# Patient Record
Sex: Female | Born: 1950
Health system: Southern US, Community
[De-identification: ages and names within clinical notes are randomized; demographics above are authoritative.]

## PROBLEM LIST (undated history)

## (undated) DIAGNOSIS — I10 Essential (primary) hypertension: Secondary | ICD-10-CM

## (undated) DIAGNOSIS — Z9889 Other specified postprocedural states: Secondary | ICD-10-CM

## (undated) DIAGNOSIS — M169 Osteoarthritis of hip, unspecified: Secondary | ICD-10-CM

## (undated) DIAGNOSIS — J189 Pneumonia, unspecified organism: Secondary | ICD-10-CM

## (undated) DIAGNOSIS — R112 Nausea with vomiting, unspecified: Secondary | ICD-10-CM

## (undated) DIAGNOSIS — C801 Malignant (primary) neoplasm, unspecified: Secondary | ICD-10-CM

## (undated) DIAGNOSIS — M8580 Other specified disorders of bone density and structure, unspecified site: Secondary | ICD-10-CM

## (undated) DIAGNOSIS — H43392 Other vitreous opacities, left eye: Secondary | ICD-10-CM

## (undated) DIAGNOSIS — F419 Anxiety disorder, unspecified: Secondary | ICD-10-CM

## (undated) DIAGNOSIS — T7840XA Allergy, unspecified, initial encounter: Secondary | ICD-10-CM

## (undated) DIAGNOSIS — F32A Depression, unspecified: Secondary | ICD-10-CM

## (undated) DIAGNOSIS — K219 Gastro-esophageal reflux disease without esophagitis: Secondary | ICD-10-CM

## (undated) DIAGNOSIS — E039 Hypothyroidism, unspecified: Secondary | ICD-10-CM

## (undated) DIAGNOSIS — H269 Unspecified cataract: Secondary | ICD-10-CM

## (undated) DIAGNOSIS — H409 Unspecified glaucoma: Secondary | ICD-10-CM

## (undated) DIAGNOSIS — E785 Hyperlipidemia, unspecified: Secondary | ICD-10-CM

## (undated) HISTORY — DX: Hypothyroidism, unspecified: E03.9

## (undated) HISTORY — DX: Hyperlipidemia, unspecified: E78.5

## (undated) HISTORY — DX: Unspecified cataract: H26.9

## (undated) HISTORY — PX: TONSILLECTOMY AND ADENOIDECTOMY: SHX28

## (undated) HISTORY — PX: JOINT REPLACEMENT: SHX530

## (undated) HISTORY — DX: Unspecified glaucoma: H40.9

## (undated) HISTORY — PX: EYE SURGERY: SHX253

## (undated) HISTORY — DX: Depression, unspecified: F32.A

## (undated) HISTORY — DX: Other vitreous opacities, left eye: H43.392

## (undated) HISTORY — DX: Osteoarthritis of hip, unspecified: M16.9

## (undated) HISTORY — DX: Essential (primary) hypertension: I10

## (undated) HISTORY — DX: Allergy, unspecified, initial encounter: T78.40XA

## (undated) HISTORY — DX: Other specified disorders of bone density and structure, unspecified site: M85.80

---

## 1996-01-18 HISTORY — PX: FLEXIBLE BRONCHOSCOPY: SUR164

## 1998-01-07 ENCOUNTER — Encounter: Payer: Self-pay | Admitting: *Deleted

## 1998-01-07 ENCOUNTER — Ambulatory Visit (HOSPITAL_COMMUNITY): Admission: RE | Admit: 1998-01-07 | Discharge: 1998-01-07 | Payer: Self-pay | Admitting: *Deleted

## 1998-04-08 ENCOUNTER — Other Ambulatory Visit: Admission: RE | Admit: 1998-04-08 | Discharge: 1998-04-08 | Payer: Self-pay | Admitting: *Deleted

## 1999-01-14 ENCOUNTER — Ambulatory Visit (HOSPITAL_COMMUNITY): Admission: RE | Admit: 1999-01-14 | Discharge: 1999-01-14 | Payer: Self-pay | Admitting: *Deleted

## 1999-01-14 ENCOUNTER — Encounter: Payer: Self-pay | Admitting: *Deleted

## 1999-05-21 ENCOUNTER — Other Ambulatory Visit: Admission: RE | Admit: 1999-05-21 | Discharge: 1999-05-21 | Payer: Self-pay | Admitting: *Deleted

## 2000-01-28 ENCOUNTER — Encounter: Payer: Self-pay | Admitting: *Deleted

## 2000-01-28 ENCOUNTER — Ambulatory Visit (HOSPITAL_COMMUNITY): Admission: RE | Admit: 2000-01-28 | Discharge: 2000-01-28 | Payer: Self-pay | Admitting: *Deleted

## 2001-02-14 ENCOUNTER — Encounter: Payer: Self-pay | Admitting: *Deleted

## 2001-02-14 ENCOUNTER — Ambulatory Visit (HOSPITAL_COMMUNITY): Admission: RE | Admit: 2001-02-14 | Discharge: 2001-02-14 | Payer: Self-pay | Admitting: *Deleted

## 2001-02-16 ENCOUNTER — Other Ambulatory Visit: Admission: RE | Admit: 2001-02-16 | Discharge: 2001-02-16 | Payer: Self-pay | Admitting: *Deleted

## 2002-02-25 ENCOUNTER — Ambulatory Visit (HOSPITAL_COMMUNITY): Admission: RE | Admit: 2002-02-25 | Discharge: 2002-02-25 | Payer: Self-pay | Admitting: *Deleted

## 2002-02-25 ENCOUNTER — Encounter: Payer: Self-pay | Admitting: *Deleted

## 2002-10-03 ENCOUNTER — Other Ambulatory Visit: Admission: RE | Admit: 2002-10-03 | Discharge: 2002-10-03 | Payer: Self-pay | Admitting: *Deleted

## 2003-03-07 ENCOUNTER — Ambulatory Visit (HOSPITAL_COMMUNITY): Admission: RE | Admit: 2003-03-07 | Discharge: 2003-03-07 | Payer: Self-pay | Admitting: *Deleted

## 2004-01-05 ENCOUNTER — Other Ambulatory Visit: Admission: RE | Admit: 2004-01-05 | Discharge: 2004-01-05 | Payer: Self-pay | Admitting: *Deleted

## 2004-03-15 ENCOUNTER — Ambulatory Visit (HOSPITAL_COMMUNITY): Admission: RE | Admit: 2004-03-15 | Discharge: 2004-03-15 | Payer: Self-pay | Admitting: *Deleted

## 2004-03-22 ENCOUNTER — Ambulatory Visit: Payer: Self-pay | Admitting: Internal Medicine

## 2004-03-26 ENCOUNTER — Ambulatory Visit: Payer: Self-pay | Admitting: Internal Medicine

## 2004-04-13 ENCOUNTER — Ambulatory Visit: Payer: Self-pay | Admitting: Internal Medicine

## 2004-04-23 ENCOUNTER — Ambulatory Visit: Payer: Self-pay | Admitting: Internal Medicine

## 2004-08-05 ENCOUNTER — Ambulatory Visit: Payer: Self-pay | Admitting: Internal Medicine

## 2004-08-06 ENCOUNTER — Ambulatory Visit: Payer: Self-pay | Admitting: Internal Medicine

## 2004-11-15 ENCOUNTER — Ambulatory Visit: Payer: Self-pay | Admitting: Pulmonary Disease

## 2005-01-13 ENCOUNTER — Other Ambulatory Visit: Admission: RE | Admit: 2005-01-13 | Discharge: 2005-01-13 | Payer: Self-pay | Admitting: *Deleted

## 2005-03-29 ENCOUNTER — Ambulatory Visit (HOSPITAL_COMMUNITY): Admission: RE | Admit: 2005-03-29 | Discharge: 2005-03-29 | Payer: Self-pay | Admitting: Obstetrics and Gynecology

## 2005-09-22 ENCOUNTER — Ambulatory Visit: Payer: Self-pay | Admitting: Internal Medicine

## 2006-03-09 ENCOUNTER — Ambulatory Visit: Payer: Self-pay | Admitting: Internal Medicine

## 2006-03-23 ENCOUNTER — Other Ambulatory Visit: Admission: RE | Admit: 2006-03-23 | Discharge: 2006-03-23 | Payer: Self-pay | Admitting: Obstetrics and Gynecology

## 2006-05-04 ENCOUNTER — Ambulatory Visit (HOSPITAL_COMMUNITY): Admission: RE | Admit: 2006-05-04 | Discharge: 2006-05-04 | Payer: Self-pay | Admitting: Obstetrics and Gynecology

## 2006-09-20 ENCOUNTER — Ambulatory Visit: Payer: Self-pay | Admitting: Internal Medicine

## 2006-10-09 ENCOUNTER — Ambulatory Visit: Payer: Self-pay | Admitting: Internal Medicine

## 2006-10-09 LAB — CONVERTED CEMR LAB
AST: 49 units/L — ABNORMAL HIGH (ref 0–37)
Albumin: 4.3 g/dL (ref 3.5–5.2)
Bacteria, UA: NEGATIVE
Basophils Relative: 0.4 % (ref 0.0–1.0)
CRP, High Sensitivity: 4 (ref 0.00–5.00)
Cholesterol: 216 mg/dL (ref 0–200)
Direct LDL: 147.1 mg/dL
Eosinophils Relative: 2 % (ref 0.0–5.0)
GFR calc Af Amer: 73 mL/min
GFR calc non Af Amer: 61 mL/min
HCT: 40.2 % (ref 36.0–46.0)
Hemoglobin: 13.7 g/dL (ref 12.0–15.0)
MCHC: 34.2 g/dL (ref 30.0–36.0)
Monocytes Relative: 11.2 % — ABNORMAL HIGH (ref 3.0–11.0)
Mucus, UA: NEGATIVE
Nitrite: NEGATIVE
Potassium: 4.5 meq/L (ref 3.5–5.1)
RBC: 4.48 M/uL (ref 3.87–5.11)
RDW: 11.5 % (ref 11.5–14.6)
Specific Gravity, Urine: 1.015 (ref 1.000–1.03)
TSH: 2.39 microintl units/mL (ref 0.35–5.50)
Total Protein, Urine: NEGATIVE mg/dL
Total Protein: 7.3 g/dL (ref 6.0–8.3)
Triglycerides: 125 mg/dL (ref 0–149)
Urine Glucose: NEGATIVE mg/dL

## 2006-10-31 ENCOUNTER — Ambulatory Visit: Payer: Self-pay | Admitting: Internal Medicine

## 2006-12-18 ENCOUNTER — Telehealth (INDEPENDENT_AMBULATORY_CARE_PROVIDER_SITE_OTHER): Payer: Self-pay | Admitting: *Deleted

## 2007-05-08 ENCOUNTER — Ambulatory Visit (HOSPITAL_COMMUNITY): Admission: RE | Admit: 2007-05-08 | Discharge: 2007-05-08 | Payer: Self-pay | Admitting: Obstetrics and Gynecology

## 2007-05-29 ENCOUNTER — Telehealth (INDEPENDENT_AMBULATORY_CARE_PROVIDER_SITE_OTHER): Payer: Self-pay | Admitting: *Deleted

## 2007-06-29 DIAGNOSIS — I10 Essential (primary) hypertension: Secondary | ICD-10-CM | POA: Insufficient documentation

## 2007-06-29 DIAGNOSIS — M8580 Other specified disorders of bone density and structure, unspecified site: Secondary | ICD-10-CM | POA: Insufficient documentation

## 2007-06-29 DIAGNOSIS — E785 Hyperlipidemia, unspecified: Secondary | ICD-10-CM | POA: Insufficient documentation

## 2007-06-29 DIAGNOSIS — E039 Hypothyroidism, unspecified: Secondary | ICD-10-CM | POA: Insufficient documentation

## 2007-07-05 ENCOUNTER — Other Ambulatory Visit: Admission: RE | Admit: 2007-07-05 | Discharge: 2007-07-05 | Payer: Self-pay | Admitting: Obstetrics and Gynecology

## 2007-07-22 ENCOUNTER — Emergency Department (HOSPITAL_COMMUNITY): Admission: EM | Admit: 2007-07-22 | Discharge: 2007-07-22 | Payer: Self-pay | Admitting: Emergency Medicine

## 2007-07-27 ENCOUNTER — Ambulatory Visit: Payer: Self-pay | Admitting: Internal Medicine

## 2007-07-27 DIAGNOSIS — T148XXA Other injury of unspecified body region, initial encounter: Secondary | ICD-10-CM | POA: Insufficient documentation

## 2008-03-14 ENCOUNTER — Ambulatory Visit: Payer: Self-pay | Admitting: Internal Medicine

## 2008-03-14 DIAGNOSIS — R42 Dizziness and giddiness: Secondary | ICD-10-CM | POA: Insufficient documentation

## 2008-05-19 ENCOUNTER — Ambulatory Visit (HOSPITAL_COMMUNITY): Admission: RE | Admit: 2008-05-19 | Discharge: 2008-05-19 | Payer: Self-pay | Admitting: Obstetrics and Gynecology

## 2008-05-23 ENCOUNTER — Encounter: Admission: RE | Admit: 2008-05-23 | Discharge: 2008-05-23 | Payer: Self-pay | Admitting: Obstetrics and Gynecology

## 2008-06-26 ENCOUNTER — Ambulatory Visit: Payer: Self-pay | Admitting: Internal Medicine

## 2008-06-26 ENCOUNTER — Encounter: Payer: Self-pay | Admitting: Internal Medicine

## 2008-06-26 DIAGNOSIS — R05 Cough: Secondary | ICD-10-CM

## 2008-06-26 DIAGNOSIS — M2550 Pain in unspecified joint: Secondary | ICD-10-CM | POA: Insufficient documentation

## 2008-06-26 DIAGNOSIS — R059 Cough, unspecified: Secondary | ICD-10-CM | POA: Insufficient documentation

## 2008-06-27 LAB — CONVERTED CEMR LAB: Vit D, 25-Hydroxy: 32 ng/mL (ref 30–89)

## 2008-06-30 ENCOUNTER — Telehealth (INDEPENDENT_AMBULATORY_CARE_PROVIDER_SITE_OTHER): Payer: Self-pay | Admitting: *Deleted

## 2009-06-04 ENCOUNTER — Encounter: Admission: RE | Admit: 2009-06-04 | Discharge: 2009-06-04 | Payer: Self-pay | Admitting: Obstetrics and Gynecology

## 2009-08-18 ENCOUNTER — Ambulatory Visit: Payer: Self-pay | Admitting: Internal Medicine

## 2009-08-19 LAB — CONVERTED CEMR LAB
ALT: 53 units/L — ABNORMAL HIGH (ref 0–35)
Alkaline Phosphatase: 74 units/L (ref 39–117)
BUN: 18 mg/dL (ref 6–23)
CO2: 30 meq/L (ref 19–32)
CRP, High Sensitivity: 4.72 (ref 0.00–5.00)
Chloride: 108 meq/L (ref 96–112)
Direct LDL: 107.2 mg/dL
Eosinophils Absolute: 0.1 10*3/uL (ref 0.0–0.7)
Eosinophils Relative: 2.5 % (ref 0.0–5.0)
GFR calc non Af Amer: 77.43 mL/min (ref >60)
Glucose, Bld: 105 mg/dL — ABNORMAL HIGH (ref 70–99)
HDL: 45.9 mg/dL (ref >39.00)
Hemoglobin, Urine: NEGATIVE
Hemoglobin: 14.8 g/dL (ref 12.0–15.0)
Ketones, ur: NEGATIVE mg/dL
Leukocytes, UA: NEGATIVE
Lymphocytes Relative: 28 % (ref 12.0–46.0)
Lymphs Abs: 1.7 10*3/uL (ref 0.7–4.0)
Monocytes Absolute: 0.6 10*3/uL (ref 0.1–1.0)
Neutro Abs: 3.5 10*3/uL (ref 1.4–7.7)
Platelets: 227 10*3/uL (ref 150.0–400.0)
RBC: 4.62 M/uL (ref 3.87–5.11)
Sodium: 142 meq/L (ref 135–145)
Specific Gravity, Urine: 1.025 (ref 1.000–1.030)
Total CHOL/HDL Ratio: 5
Total Protein, Urine: NEGATIVE mg/dL
Urobilinogen, UA: 0.2 (ref 0.0–1.0)
Vit D, 25-Hydroxy: 37 ng/mL (ref 30–89)
pH: 5.5 (ref 5.0–8.0)

## 2009-10-15 ENCOUNTER — Telehealth (INDEPENDENT_AMBULATORY_CARE_PROVIDER_SITE_OTHER): Payer: Self-pay | Admitting: *Deleted

## 2009-12-08 ENCOUNTER — Encounter: Payer: Self-pay | Admitting: Internal Medicine

## 2010-02-08 ENCOUNTER — Encounter: Payer: Self-pay | Admitting: Obstetrics and Gynecology

## 2010-02-14 LAB — CONVERTED CEMR LAB
ALT: 56 units/L — ABNORMAL HIGH (ref 0–35)
AST: 44 units/L — ABNORMAL HIGH (ref 0–37)
Albumin: 4.7 g/dL (ref 3.5–5.2)
Alkaline Phosphatase: 75 units/L (ref 39–117)
BUN: 18 mg/dL (ref 6–23)
CRP, High Sensitivity: 4 (ref 0.00–5.00)
Calcium: 9.6 mg/dL (ref 8.4–10.5)
Creatinine, Ser: 1 mg/dL (ref 0.4–1.2)
Eosinophils Absolute: 0.1 10*3/uL (ref 0.0–0.7)
GFR calc non Af Amer: 60.08 mL/min (ref >60)
Glucose, Bld: 106 mg/dL — ABNORMAL HIGH (ref 70–99)
Hemoglobin: 14.7 g/dL (ref 12.0–15.0)
Lymphs Abs: 1.7 10*3/uL (ref 0.7–4.0)
MCHC: 34.8 g/dL (ref 30.0–36.0)
MCV: 90.2 fL (ref 78.0–100.0)
Monocytes Absolute: 0.7 10*3/uL (ref 0.1–1.0)
Platelets: 222 10*3/uL (ref 150.0–400.0)
Potassium: 4 meq/L (ref 3.5–5.1)
RBC: 4.69 M/uL (ref 3.87–5.11)
Total CHOL/HDL Ratio: 4
Total Protein: 7.6 g/dL (ref 6.0–8.3)
Triglycerides: 198 mg/dL — ABNORMAL HIGH (ref 0.0–149.0)
WBC: 5.9 10*3/uL (ref 4.5–10.5)

## 2010-02-18 NOTE — Assessment & Plan Note (Signed)
Summary: Primary svc/ cpx     Primary Provider/Referring Provider:  Sherene Sires  CC:  cpx fasting.  History of Present Illness: 51  yowf quit smoking 2003 with h/o HBP   June 26, 2008 cpx only c/o =  hoarseness and indigestion off and on for up to sev years, worse for several months, better with tums and not eating as much late in evening. no cough or sob over baseline rec trial of diet only to reduce likelihood of gerd  August 18, 2009 cpx c/o occ dizzy, daily drainage issues with cough prod white mucus. Pt denies any significant sore throat, dysphagia, itching, sneezing,  nasal congestion or excess secretions,  fever, chills, sweats, unintended wt loss, pleuritic or exertional cp, hempoptysis, change in activity tolerance  orthopnea pnd or leg swelling. Pt also denies any obvious fluctuation in symptoms with weather or environmental change or other alleviating or aggravating factors.       Current Medications (verified): 1)  Budeprion Sr 150 Mg  Tb12 (Bupropion Hcl) .Marland Kitchen.. 1 Once Daily 2)  Levoxyl 25 Mcg Tabs (Levothyroxine Sodium) .... Take As Directed 3)  Vagifem 25 Mcg  Tabs (Estradiol) .... Twice Weekly 4)  Aspirin Adult Low Strength 81 Mg  Tbec (Aspirin) .Marland Kitchen.. 1 3 Times Per Week 5)  Maxzide-25 37.5-25 Mg  Tabs (Triamterene-Hctz) .... One Tablet By Mouth Daily 6)  Vitamin D (Ergocalciferol) 50000 Unit Caps (Ergocalciferol) .Marland Kitchen.. 1 By Mouth Once Per Wk 7)  Multivitamins  Tabs (Multiple Vitamin) .Marland Kitchen.. 1 Once Daily 8)  Benadryl 25 Mg Tabs (Diphenhydramine Hcl) .Marland Kitchen.. 1 At Bedtime As Needed 9)  Tums 500 Mg Chew (Calcium Carbonate Antacid) .... As Needed 10)  Zantac 150 Mg Tabs (Ranitidine Hcl) .Marland Kitchen.. 1 Once Daily As Needed  Allergies (verified): 1)  ! Pcn  Past History:  Past Medical History: HYPERTENSION (ICD-401.9) HYPOTHYROIDISM (ICD-244.9) DYSLIPIDEMIA (ICD-272.4)    - Taret ldl < 130 pos fm hx, h/o smoking, hbp OSTEOPENIA (ICD-733.90)     - DEXA  09/20/06  T spine-.7,  Left Fem Neck -1.9,   Right Fem neck -1.1 > declined f/u study August 18, 2009  RML Syndorme    - FOB  09/18/1996  DJD R Hip..............................................................................................Marland KitchenMarland KitchenAlusio HEALTH MAINTENANCE.........................................................................Marland KitchenWert     - GYN = Grubb PA     - Colonoscopy neg 04/23/04     -  Td 06/2008     - Pneumovax 7/06 second one     - CPX  August 18, 2009   Allergies:  Pcn > rash  Family History: allergies in mother lymphoma in mother asthma in brother 4 years older only sibling colon ca brother neg cva/aneurysms IHD Father onset 67s  Social History: Reviewed history from 06/26/2008 and no changes required. 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 Nl external ear canals without cough reflex Neck without JVD/Nodes/TM Lungs clear to A and P bilaterally without cough on insp or exp maneuvers RRR no s3 or murmur or increase in P2 Abd soft and  non tender Ext warm without calf tenderness, cyanosis clubbing or edema Skin warm and dry without lesions   Neuro alert, appropriate, no deficits or pathologic reflexes  Cholesterol          [H]  223 mg/dL                   0-454     ATP III Classification            Desirable:  < 200 mg/dL                    Borderline High:  200 - 239  mg/dL               High:  > = 240 mg/dL   Triglycerides        [H]  363.0 mg/dL                 0.9-811.9     Normal:  <150 mg/dL     Borderline High:  147 - 199 mg/dL   HDL                       82.95 mg/dL                 >62.13   VLDL Cholesterol     [H]  72.6 mg/dL                  0.8-65.7  CHO/HDL Ratio:  CHD Risk                             5                    Men          Women     1/2 Average Risk     3.4          3.3     Average Risk          5.0          4.4     2X Average Risk          9.6          7.1     3X Average Risk          15.0          11.0                           Tests: (2) BMP (METABOL)   Sodium                    142 mEq/L                   135-145   Potassium                 4.2 mEq/L  3.5-5.1   Chloride                  108 mEq/L                   96-112   Carbon Dioxide            30 mEq/L                    19-32   Glucose              [H]  105 mg/dL                   04-54   BUN                       18 mg/dL                    0-98   Creatinine                0.8 mg/dL                   1.1-9.1   Calcium                   9.5 mg/dL                   4.7-82.9   GFR                       77.43 mL/min                >60  Tests: (3) CBC Platelet w/Diff (CBCD)   White Cell Count          6.0 K/uL                    4.5-10.5   Red Cell Count            4.62 Mil/uL                 3.87-5.11   Hemoglobin                14.8 g/dL                   56.2-13.0   Hematocrit                41.0 %                      36.0-46.0   MCV                       88.9 fl                     78.0-100.0   MCHC                      36.0 g/dL                   86.5-78.4   RDW                       12.7 %                      11.5-14.6   Platelet Count  227.0 K/uL                  150.0-400.0   Neutrophil %              58.6 %                      43.0-77.0   Lymphocyte %              28.0 %                      12.0-46.0   Monocyte %                 10.6 %                      3.0-12.0   Eosinophils%              2.5 %                       0.0-5.0   Basophils %               0.3 %                       0.0-3.0   Neutrophill Absolute      3.5 K/uL                    1.4-7.7   Lymphocyte Absolute       1.7 K/uL                    0.7-4.0   Monocyte Absolute         0.6 K/uL                    0.1-1.0  Eosinophils, Absolute                             0.1 K/uL                    0.0-0.7   Basophils Absolute        0.0 K/uL                    0.0-0.1  Tests: (4) Hepatic/Liver Function Panel (HEPATIC)   Total Bilirubin           0.9 mg/dL                   2.5-4.2   Direct Bilirubin          0.1 mg/dL                   7.0-6.2   Alkaline Phosphatase      74 U/L                      39-117   AST                  [H]  40 U/L                      0-37   ALT                  [H]  53 U/L  0-35   Total Protein             7.0 g/dL                    1.6-1.0   Albumin                   4.6 g/dL                    9.6-0.4  Tests: (5) TSH (TSH)   FastTSH                   1.62 uIU/mL                 0.35-5.50  Tests: (6) UDip Only (UDIP)   Color                     LT. YELLOW       RANGE:  Yellow;Lt. Yellow   Clarity                   CLEAR                       Clear   Specific Gravity          1.025                       1.000 - 1.030   Urine Ph                  5.5                         5.0-8.0   Protein                   NEGATIVE                    Negative   Urine Glucose             NEGATIVE                    Negative   Ketones                   NEGATIVE                    Negative   Urine Bilirubin           NEGATIVE                    Negative   Blood                     NEGATIVE                    Negative   Urobilinogen              0.2                         0.0 - 1.0   Leukocyte Esterace        NEGATIVE                    Negative   Nitrite                   NEGATIVE  Negative  Tests: (7) Full Range CRP (FCRP)   CRPH                      4.72 mg/L                   0.00-5.00     Note:  An elevated hs-CRP (>5 mg/L) should be repeated after 2 weeks to rule out recent infection or trauma.  Tests: (8) Cholesterol LDL - Direct (DIRLDL)  Cholesterol LDL - Direct                             107.2 mg/dL     Optimal:  <601 mg/dL     Near or Above Optimal:  100-129 mg/dL     Borderline High:  093-235 mg/dL     High:  573-220 mg/dL     Very High:  >254 mg/dL  CXR  Procedure date:  08/18/2009  Findings:      Probable residual linear atelectasis or scarring in the right middle lobe.  No definite pneumonia.  Impression & Recommendations:  Problem # 1:  DYSLIPIDEMIA (ICD-272.4)   - Target ldl < 130 pos fm hx, h/o smoking, hbp  Labs Reviewed: SGOT: 44 (06/26/2008)   SGPT: 56 (06/26/2008)   HDL:57.50 (06/26/2008), 49.3 (10/09/2006)  LDL:DEL (10/09/2006)    LDL 107 August 18, 2009 ok Chol:250 (06/26/2008), 216 (10/09/2006)  Trig:198.0 (06/26/2008), 125 (10/09/2006)  Problem # 2:  OSTEOPENIA (ICD-733.90) vit d ok, delcines dexa  Problem # 3:  COUGH (ICD-786.2) The most common causes of chronic cough in immunocompetent adults include: upper airway cough syndrome (UACS), previously referred to as postnasal drip syndrome,  caused by variety of rhinosinus conditions; (2) asthma; (3) GERD; (4) chronic bronchitis from cigarette smoking or other inhaled environmental irritants; (5) nonasthmatic eosinophilic bronchitis; and (6) bronchiectasis. These conditions, singly or in combination, have accounted for up to 94% of the causes of chronic cough in prospective studies.  try rx for gerd fist  The standardized cough guidelines recently published in Chest are a 14 step process, not a single office visit,  and are intended  to address this problem logically,  with an alogrithm dependent on response to each progressive step  to determine a specific diagnosis with   minimal addtional testing needed. Therefore if compliance is an issue this empiric standardized approach simply won't work.   Problem # 4:  HYPOTHYROIDISM (ICD-244.9)  Her updated medication list for this problem includes:    Levoxyl 25 Mcg Tabs (Levothyroxine sodium) .Marland Kitchen... Take as directed  Labs Reviewed: TSH: 2.41 (06/26/2008)   >  1.62 on rx August 18, 2009  Chol: 250 (06/26/2008)   HDL: 57.50 (06/26/2008)   LDL: DEL (10/09/2006)   TG: 198.0 (06/26/2008)  Medications Added to Medication List This Visit: 1)  Zantac 150 Mg Tabs (Ranitidine hcl) .Marland Kitchen.. 1 once daily as needed  Other Orders: EKG w/ Interpretation (93000) TLB-Lipid Panel (80061-LIPID) TLB-BMP (Basic Metabolic Panel-BMET) (80048-METABOL) TLB-CBC Platelet - w/Differential (85025-CBCD) TLB-Hepatic/Liver Function Pnl (80076-HEPATIC) TLB-TSH (Thyroid Stimulating Hormone) (84443-TSH) TLB-Udip ONLY (81003-UDIP) T-Vitamin D (25-Hydroxy) (27062-37628) TLB-CRP-High Sensitivity (C-Reactive Protein) (86140-FCRP) T-2 View CXR (71020TC) Est. Patient 40-64 years (31517)  Patient Instructions: 1)  Call [phone] for your results w/in next 3 days - if there's something important  I feel you need to know,  I'll be in touch with you directly.  2)  GERD (REFLUX)  is  a common cause of respiratory symptoms. It commonly presents without heartburn and can be treated with medication, but also with lifestyle changes including avoidance of late meals, excessive alcohol, smoking cessation, and avoid fatty foods, chocolate, peppermint, colas, red wine, and acidic juices such as orange juice. NO MINT OR MENTHOL PRODUCTS SO NO COUGH DROPS  3)  USE SUGARLESS CANDY INSTEAD (jolley ranchers)  4)  NO OIL BASED VITAMINS  5)  Prilosec 20 mg Take  one 30-60 min before first meal of the day and pepcid 20 mg (zantac = 150) at bedtime x one month if this does not  erradicate the cough to your satisfaction return to clinic   CardioPerfect ECG  ID:  [id number] Patient: [name], [name] DOB: [birth year] Age: 60 Years Old Sex: Female Race: White Height: 65 Weight: 163 Status: Unconfirmed Past Medical History:  HYPERTENSION (ICD-401.9) HYPOTHYROIDISM (ICD-244.9) DYSLIPIDEMIA (ICD-272.4)    - Taret ldl < 130 pos fm hx, h/o smoking, hbp OSTEOPENIA (ICD-733.90)     - DEXA  09/20/06  T spine-.7,  Left Fem Neck -1.9,  Right Fem neck -1.1 RML Syndorme    - FOB  09/18/1996  DJD R Hip..............................................................................................Marland KitchenMarland KitchenAlusio HEALTH MAINTENANCE.........................................................................Marland KitchenWert     - GYN = Grubb PA     - Colonoscopy neg 04/23/04     -  Td 06/2008     - Pneumovax 7/06 second one     - CPX  June 26, 2008   Allergies:  Pcn > rash Recorded: 08/18/2009 08:58 AM P/PR: 105 ms / 147 ms - Heart rate (maximum exercise) QRS: 91 QT/QTc/QTd: 438 ms / 457 ms / 49 ms - Heart rate (maximum exercise)  P/QRS/T axis: 60 deg / 89 deg / 58 deg - Heart rate (maximum exercise)  Heartrate: 71 bpm  Interpretation:   sinus rhythm  vertical axis  long QT interval, consider hypocalcaemia or quinidine-like drug   Borderline ECG     CXR  Procedure date:  08/18/2009  Findings:      Probable residual linear atelectasis or scarring in the right middle lobe.  No definite pneumonia.

## 2010-02-18 NOTE — Letter (Signed)
Summary: [facility]   Imported By: Sherian Rein 12/16/2009 09:14:38  _____________________________________________________________________  External Attachment:    Type:   Image     Comment:   External Document

## 2010-02-18 NOTE — Progress Notes (Signed)
Summary: wanted vit d results   Phone Note Call from Patient Call back at Work Phone [phone]   Caller: Patient Call For: wert Summary of Call: wants to know Vitamin D levels from last labs.  Initial call taken by: Tivis Ringer, CNA,  October 15, 2009 11:16 AM  Follow-up for Phone Call        [facility].  most recent vit d level done 08/18/2009 and was  37. Normal range is 30-89.  Aundra Millet Reynolds LPN  October 15, 2009 11:42 AM    pt called back.  informed her of vit d level.  nothing further needed.  Aundra Millet Reynolds LPN  October 15, 2009 11:51 AM

## 2010-02-24 ENCOUNTER — Telehealth: Payer: Self-pay | Admitting: Internal Medicine

## 2010-03-04 NOTE — Progress Notes (Signed)
Summary: insurance does not cover maxzide and wants pt to use generic  Phone Note Call from Patient   Caller: Patient Call For: ZOXW Summary of Call: Patient phoned stated that she received notice from her insurance that they will no longer cover her Maxzide and wants her to get a generic. [facility] [place]. She can be reached at [phone] Initial call taken by: Vedia Coffer,  February 24, 2010 11:30 AM  Follow-up for Phone Call        [facility] pt has been receiving Maxide BMN need to verify that she is okay with going generic. Zackery Barefoot CMA  February 24, 2010 11:42 AM  Patient phoned returning a call to triage  pt can be reached at  [phone]. Marland KitchenVedia Coffer  February 24, 2010 1:16 PM  Pt states due to insurance needs to change to generic. Pt requesting 30 day supply. [facility] [place]. Zackery Barefoot CMA  February 24, 2010 2:21 PM        Prescriptions: MAXZIDE-25 37.5-25 MG  TABS (TRIAMTERENE-HCTZ) One tablet by mouth daily  #34 Tablet x 5   Entered by:   Zackery Barefoot CMA   Authorized by:   Nyoka Cowden MD   Signed by:   Zackery Barefoot CMA on 02/24/2010   Method used:   [facility]  [place] [address] (retail)       [address]       [place], [place]  [address]       Ph: [phone] or [phone]       Fax: [phone]   RxID:   [id number]

## 2010-06-01 NOTE — Assessment & Plan Note (Signed)
[facility]                             PULMONARY OFFICE NOTE   NAME:[name], [name]                      MRN:          [id number]  DATE:10/09/2006                            DOB:          [birth year]    HISTORY:  This is a 60 year old white female with chronic cough and  documented right middle lobe/lingular syndrome with previous  bronchoscopy in 1998 indicating gram-negative rods obtained from  culture.  Since that time she has been she has been left with a cough  that waxes and wanes, typically dry, typically more day than nighttime,  associated with sneezing but no purulent secretions, and worse in the  spring and fall.  She comes in now for the last two weeks since the  weather change with increasing nasal congestion with watery nasal  discharge, no fevers, chills, sweats, no purulent sputum, pleuritic  pain, orthopnea, PND or significant dyspnea.   PAST MEDICAL HISTORY:  1. 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  2. Hypothyroidism.  3. Hypertension.   ALLERGIES:  PENICILLIN causes a rash.   MEDICATIONS:  Taken in detail on the worksheet.   SOCIAL HISTORY:  She quit smoking in January of 2003.   FAMILY HISTORY:  Positive for allergies in her mother, also asthma in a  brother.   REVIEW OF SYSTEMS:  Taken in detail on the worksheet and negative except  as outlined above.   PHYSICAL EXAMINATION:  This is an anxious but quite pleasant ambulatory  white female in no acute distress.  She is afebrile with normal vital signs with a weight of 157 pounds  which is no change in baseline.  Height is 5 feet 5 inches, no change.  HEENT:  Remarkable for mild turbinate edema, oropharynx is clear.  Limited funduscopy revealed no obvious arterial change.  NECK:  Supple without cervical adenopathy or tenderness.  Trachea is  midline, no  thyromegaly.  Lung fields were completely clear bilaterally to auscultation and  percussion.  There is a regular rate and rhythm without murmurs, gallops, or rubs  with no increase in P2.  ABDOMEN:  Soft, benign with no palpable organomegaly, masses, or  tenderness.  GENITOURINARY/RECTAL:  Per Dr. Alver Fisher PA.  EXTREMITIES:  Warm without calf tenderness, cyanosis, clubbing, or  edema.  Pedal pulses were present bilaterally, but stronger in the  dorsalis pedis than the posterior tibial distribution.  MUSCULOSKELETAL EXAM:  She had limited internal rotation of the right  hip, otherwise normal hip, ankle, knee, shoulder, elbow, and wrist  motion without crepitus.  SKIN EXAM:  Warm and dry with no lesions.   LABORATORY DATA:  Chest x-ray showed increased markings over the right  middle lobe lingula as in the past.   CBC showed no significant leukocytosis.  Chemistry profile was normal.  Liver function tests showed an SGOT of 49, SGPT of 69, total cholesterol  was 216 with an LDL of 147, HDL of 49, TSH was normal.  Urinalysis was  unremarkable, and CRP was 4.   EKG was normal.   IMPRESSION:  1. Right middle lobe lingula syndrome with chronic cough that appears      to be more upper airway than lower airway in nature, and,      therefore, I believe there are 2 different problems.  I believe she      would be a good candidate to try over-the-counter medications such      as Claritin first, and consideration for nasal steroids and/or      Astelin if not improved.  2. Slight elevation of liver function tests.  In retrospect, she has      had these in the past.  We need to make sure she minimizes alcohol      and acetaminophen use, and screen her for hepatitis serology on her      next visit.  3. Hypothyroidism, on adequate supplements.  4. Hyperlipidemia with positive family history plus smoking history,      therefore, needs to maintain an LDL of less than 130 which she is      not  doing at present.  Because her liver function tests are      slightly elevated, I would try to avoid statins if at all possible,      recommend diet and exercise.  5. Osteopenia by previous studies.  The most recent of which appears      to be in 2004.  If so, she will need a vitamin D level and a repeat      DEXA now to compare to the one in 6/06  6. Arthritis involving the remote right hip.  I have recommended the      use of ibuprofen, as much as 600 mg t.i.d. p.r.n.   Followup will be in a week to review her lab data with her and her range  for bone densitometry as well as a generation of a medication calendar.     [name], MD, [facility]  Electronically Signed    MBW/MedQ  DD: 10/10/2006  DT: 10/11/2006  Job #: [id number]

## 2010-06-01 NOTE — Assessment & Plan Note (Signed)
[name]                             PULMONARY OFFICE NOTE   NAME:[name], [name]                      MRN:          [id number]  DATE:10/31/2006                            DOB:          29-Feb-1950    HISTORY OF PRESENT ILLNESS:  The patient is a 60 year old white female  patient of Dr. Thurston Hole who has a known history of right middle  lobe/lingular scarring secondary to acquired mucociliary dysfunction  that presents for a 3 week followup.  Last visit patient had her  complete physical exam.  Patient has mild dyslipidemia with an LDL goal  of less than 130.  Lab work on October 09, 2006, revealed a total  cholesterol of 216, an LDL at 147.  CRP was at 4.  Patient also had some  slightly elevated LFTs that had been slightly elevated in the past with  an AST of 49, ALT of 69.  Patient's blood sugar was mildly elevated at  122.  Patient reports she does not exercise on a regular basis and  continues to eat and does not diet as much as she should.  Patient does  state that she has a glass of wine on most days.  Patient denies any  chest pain, palpitations, orthopnea, PND, exertional chest pain.   PAST MEDICAL HISTORY:  Reviewed.   CURRENT MEDICATIONS:  Reviewed.   PHYSICAL EXAM:  Patient is a pleasant female in no acute distress.  She  is afebrile with stable vital signs.  HEENT:  Unremarkable.  NECK:  Supple without cervical adenopathy.  LUNG SOUNDS:  Clear to auscultation bilaterally without any wheezing or  crackles.  CARDIAC:  Regular rate.  ABDOMEN:  Soft and nontender.  EXTREMITIES:  Warm without any edema.   DATA:  Bone density on September 20, 2006, showed an AS spine T score of  -0.7, which was slightly improved from previous in 2006 with left hip at  -1.9, which was slightly increased from 1.7 two years ago.   IMPRESSION AND PLAN:  1. Dyslipidemia with an LDL goal of less than 130.  I had an in depth      conversation with patient  concerning today lifestyle modifications      including weight loss, exercise and dietary changes.  Patient      prefers to try lifestyle modifications prior to beginning      medications.  She will return back here in 3 months for a fasting      lipid panel.  2. Mildly elevated LFTs.  Patient has been recommended to decrease her      Tylenol use and alcohol intake.  Patient will return back in 3      months for followup and advice on weight loss.  Patient will return      back in 3 months for followup.  Repeat lab and if still elevated      will need to do further laboratory studies along with possibly an      abdominal ultrasound.  3. Osteopenia.  Patient with slightly decreased T scores.  Patient has  recently had a low Vitamin D level which she took increased Vitamin      D doses over the last 6 months.  Vitamin D level has returned to      normal at 46.  I recommend that she continue on over-the-counter      Caltrate, 1200 mg of calcium with 1000 international units of      Vitamin D.  Patient is advised on exercise, weight bearing      exercises and continued calcium supplements.  Will repeat this in 2      years.  If continues to worsen, will need to add in bisphosphonates      to her present regimen.  4. Complex medication regimen.  Patient's medications were reviewed in      detail.  Patient education was provided.  A computerized medication      calendar was completed for this patient and reviewed in detail.      Rubye Oaks, NP  Electronically Signed      [name], MD, [facility]  Electronically Signed   TP/MedQ  DD: 10/31/2006  DT: 10/31/2006  Job #: [id number]

## 2010-06-03 ENCOUNTER — Other Ambulatory Visit: Payer: Self-pay | Admitting: Nurse Practitioner

## 2010-06-03 DIAGNOSIS — Z1231 Encounter for screening mammogram for malignant neoplasm of breast: Secondary | ICD-10-CM

## 2010-06-04 NOTE — Assessment & Plan Note (Signed)
[facility]                             PULMONARY OFFICE NOTE   NAME:GRAMLINGCyerra, Yim                      MRN:          [id number]  DATE:03/09/2006                            DOB:          1950/01/23    This is a 60 year old white female in for her first follow-up evaluation  from September 2007 when she had comprehensive health care evaluation.  Significant for slight increase in markings over the right middle lobe  and right lower lobe, and reported elevations in blood pressure, for  which she has been maintained on Maxzide 25 mg 1/2 q.a.m.   She denies any exertional chest pain, pleuritic pain, cough, fever,  purulent sputum or history of hemoptysis.   PAST MEDICAL HISTORY:  Right middle lobe/lingular scarring, secondary to  acquired mucocellulare dysfunction, presumably from smoking.  With a  history of gram-negative tracheobronchitis in 1998.   ALLERGIES:  PENICILLIN (causes a rash).   MEDICATIONS:  Taken in detail from the worksheet and correct as listed.   SOCIAL HISTORY:  She quit smoking January 2003.   FAMILY HISTORY:  Positive for allergies in her mother.  Asthma in her  brother.   REVIEW OF SYSTEMS:  Taken in detail and negative, except as above.   PHYSICAL EXAMINATION:  GENERAL:  She is anxious but pleasant, ambulatory  white female in no acute distress.  VITAL SIGNS:  Blood pressure 136/80.  HEENT:  Unremarkable.  NECK:  Supple without cervical adenopathy or tenderness.  Trachea is  midline.  LUNGS:  Lung fields completely clear bilaterally.  No localized or  generalized wheezing or rhonchi.  CARDIAC:  Regular rate and rhythm without murmurs, rubs or gallops.  Increase in P2.  ABDOMEN:  Soft, benign.  EXTREMITIES:  Warm and without calf tenderness, cyanosis, clubbing or  edema.   LABORATORY STUDIES:  Saturation 99% on room air.   IMPRESSION:  Extensive discussion with the patient lasted 15-20 minutes,  with a 25 minute  visit on the following topics.  1. Her blood pressure is adequately controlled.  By self monitoring,      if she is consistently above 85 on the diastolic, then I think it      is reasonable to take the whole Maxzide.  Ideally we would like to      see the systolic less than 135 as well.  2. Right middle lobe/lingular syndrome.  We spent the greatest amount      of time discussing this issue.  She is concerned about lung cancer;      but, clinically has had several significant infections in the      distribution of the right middle lobe and lingula, leading to      enough scarring to where it would be difficult to distinguish the      signal to noise ratio without more aggressive screening (such as      CT scanning and bronchoscopy).  In the absence of convincing change      on a chest x-ray and/or new symptoms, I do not believe it is cost  effective to do CT scans -- based on the most recent guidelines      from the [employer]; and it would expose the patient      to unnecessary radiation and potential inappropriate surgery.      Followup will be in September 2008, sooner with a chest x-ray      pending from today's evaluation.     [name], MD, [facility]  Electronically Signed    MBW/MedQ  DD: 03/09/2006  DT: 03/09/2006  Job #: [id number]

## 2010-06-04 NOTE — Assessment & Plan Note (Signed)
[facility]                               PULMONARY OFFICE NOTE   NAME:[name], [name]                      MRN:          [id number]  DATE:09/22/2005                            DOB:          1950-12-07    CHIEF COMPLAINT:  Cough.   HISTORY:  This is a 60 year old, former smoker with diagnosis of right  middle lobe/lingular scarring with previous evidence of gram-negative-rod  tracheobronchitis in 1998, but much better since she stopped smoking in  January of 2003, with only minimal mucoid production in the mornings now, no  significant dyspnea or pleuritic pain, fevers, chills, sweats, orthopnea,  PND or leg swelling.  She admits she has not been aerobically active, denies  any exertional chest pain, orthopnea, PND, TIA or claudication symptoms.   PAST MEDICAL HISTORY:  1. Right middle lobe lingular scarring, presumed secondary to acquire      mucociliary dysfunction secondary to smoking that has improved since      smoking cessation.  2. Chronic rhinitis, controlled on over-the-counter medications.  3. Chronic fatigue responsive to Wellbutrin.  4. Hypothyroidism, on supplements.  5. Status post remote tonsillectomy.  6. History of borderline diabetes and hypertension, never treated to date.   ALLERGIES:  PENICILLIN causes a rash.   MEDICATIONS:  Wellbutrin, Levoxyl, Vagifem (hormone replacement therapy), a  baby aspirin and multivitamins daily.   SOCIAL HISTORY:  She quit smoking in January 2003.  She denies any alcohol  use.  She works as a Clinical research associate.  Her father was an anesthesiologist.   FAMILY HISTORY:  Positive for allergies in her mother and asthma in her  brother; brother had diabetes and also developed colon cancer.  This past  year, her mother developed lymphoma in her 64s and her father had heart and  stroke problems in his 87s.   REVIEW OF SYSTEMS:  Taken in detail on the work sheet and significant for  the problems as outlined  above.   PHYSICAL EXAMINATION:  GENERAL:  This is a pleasant, ambulatory, slightly  anxious white female in no acute distress.  VITAL SIGNS:  The patient has normal vital signs with a height of 5 feet 4  inches, which is no change in baseline.  Weight is 162, which was also no  change.  Blood pressure is 130/80.  HEENT:  Ocular exam was done with a limited funduscopy revealing no obvious  retinal or arterial change.  Oropharynx was clear.  NECK:  No evidence of cervical adenopathy or tenderness.  Trachea is  midline.  No thyromegaly.  Carotid upstrokes were brisk without any bruits.  Oropharynx was clear.  Dentition was intact.  Nasal turbinates normal.  LUNGS:  Lung fields are perfectly clear bilaterally to auscultation and  percussion with no localized or generalized wheezing or rhonchi.  CARDIAC:  Regular rhythm without murmur, gallop or rub present.  There is no  displacement of PMI or increase in P2.  BREASTS AND GENITOURINARY:  Per Dr. Jeannette Corpus office.  ABDOMEN:  Soft and benign with no palpable organomegaly, masses or  tenderness.  No bruits appreciated.  EXTREMITIES:  Warm without calf tenderness, cyanosis or edema.  SKIN:  Warm and dry with no lesions.  MUSCULOSKELETAL:  No obvious joint abnormalities.  EXTREMITIES:  Pedal pulses were stronger in dorsalis pedis than the  posterior tibial distribution in symmetric fashion bilaterally.  NEUROLOGIC:  No focal deficits or pathologic reflexes.   Chest x-ray showed minimal scarring over the lingula and right middle lobe.   EKG revealed sinus rhythm with no ischemic changes.   CBC was normal with no significant eosinophilia.  TSH was normal.  CRP was  6.0.  Urinalysis showed traces of rbc's.  Total cholesterol was 227 with an  HDL of 51, LDL of 130 and fasting blood sugar is now 87.   IMPRESSION:  1. Acquired mucociliary dysfunction with right middle lobe and lingular      scarring clinically has resolved with minimal  residual cough, status      post successful smoking cessation in 2003.  2. Hypothyroidism, well-compensated on present dose of thyroid, which was      not changed.  3. Chronic fatigue, much better on Wellbutrin, which also affords some      protection against recurrent smoking.  4. History of borderline diabetes, not present now.  5. Borderline hypertension.  She does tend to have a tendency of slight      fluid retention in her legs at the end of the day, therefore I      recommended Maxzide 25 mg one-half daily, which will also afford some      protection against calcium loss in her post-menopausal state.  6. Hyperlipidemia.  Her LDL has actually improved from 160 a year ago,      although her CRP is slightly elevated.  I have emphasized the      importance of maintaining as much aerobic weightbearing exercise as      possible for purposes of optimizing HDL and reducing CRP and also      improving bone health in general.  7. Positive family history of colon cancer.  However, a chart review      indicates she underwent a normal colonoscopy in 2006 and is not due for      recall for 10 years.  8. Health maintenance:  All of her gynecological health care is through      Dr. Felipe Drone office.  Her last tetanus was in 2000 and Pneumovax in      2006; followup can be yearly therefore.                                   [name], MD, [facility]   MBW/MedQ  DD:  09/26/2005  DT:  09/27/2005  Job #:  [id number]

## 2010-06-11 ENCOUNTER — Ambulatory Visit (HOSPITAL_COMMUNITY): Payer: Federal, State, Local not specified - PPO

## 2010-07-07 ENCOUNTER — Ambulatory Visit (HOSPITAL_COMMUNITY): Payer: Federal, State, Local not specified - PPO

## 2010-07-09 ENCOUNTER — Ambulatory Visit (HOSPITAL_COMMUNITY)
Admission: RE | Admit: 2010-07-09 | Discharge: 2010-07-09 | Disposition: A | Discharge status: Home / Self Care | Payer: Federal, State, Local not specified - PPO | Source: Ambulatory Visit | Attending: Nurse Practitioner | Admitting: Nurse Practitioner

## 2010-07-09 DIAGNOSIS — Z1231 Encounter for screening mammogram for malignant neoplasm of breast: Secondary | ICD-10-CM

## 2010-09-02 ENCOUNTER — Other Ambulatory Visit: Payer: Self-pay | Admitting: Internal Medicine

## 2010-10-07 ENCOUNTER — Other Ambulatory Visit: Payer: Self-pay | Admitting: Internal Medicine

## 2010-10-28 ENCOUNTER — Encounter: Payer: Federal, State, Local not specified - PPO | Admitting: Internal Medicine

## 2010-11-05 ENCOUNTER — Encounter: Payer: Self-pay | Admitting: Internal Medicine

## 2010-11-08 ENCOUNTER — Other Ambulatory Visit: Payer: Self-pay | Admitting: Internal Medicine

## 2010-11-08 ENCOUNTER — Ambulatory Visit (INDEPENDENT_AMBULATORY_CARE_PROVIDER_SITE_OTHER): Payer: Federal, State, Local not specified - PPO | Admitting: Internal Medicine

## 2010-11-08 ENCOUNTER — Other Ambulatory Visit (INDEPENDENT_AMBULATORY_CARE_PROVIDER_SITE_OTHER): Payer: Federal, State, Local not specified - PPO

## 2010-11-08 ENCOUNTER — Ambulatory Visit (INDEPENDENT_AMBULATORY_CARE_PROVIDER_SITE_OTHER)
Admission: RE | Admit: 2010-11-08 | Discharge: 2010-11-08 | Disposition: A | Discharge status: Home / Self Care | Payer: Federal, State, Local not specified - PPO | Source: Ambulatory Visit | Attending: Internal Medicine | Admitting: Internal Medicine

## 2010-11-08 ENCOUNTER — Encounter: Payer: Self-pay | Admitting: Internal Medicine

## 2010-11-08 ENCOUNTER — Telehealth: Payer: Self-pay | Admitting: Internal Medicine

## 2010-11-08 DIAGNOSIS — E039 Hypothyroidism, unspecified: Secondary | ICD-10-CM

## 2010-11-08 DIAGNOSIS — M899 Disorder of bone, unspecified: Secondary | ICD-10-CM

## 2010-11-08 DIAGNOSIS — I10 Essential (primary) hypertension: Secondary | ICD-10-CM

## 2010-11-08 DIAGNOSIS — M2550 Pain in unspecified joint: Secondary | ICD-10-CM

## 2010-11-08 DIAGNOSIS — Z8371 Family history of colonic polyps: Secondary | ICD-10-CM | POA: Insufficient documentation

## 2010-11-08 DIAGNOSIS — M949 Disorder of cartilage, unspecified: Secondary | ICD-10-CM

## 2010-11-08 DIAGNOSIS — Z83719 Family history of colon polyps, unspecified: Secondary | ICD-10-CM

## 2010-11-08 DIAGNOSIS — Z0000 Encounter for general adult medical examination without abnormal findings: Secondary | ICD-10-CM

## 2010-11-08 DIAGNOSIS — E785 Hyperlipidemia, unspecified: Secondary | ICD-10-CM

## 2010-11-08 LAB — CBC WITH DIFFERENTIAL/PLATELET
Basophils Absolute: 0 10*3/uL (ref 0.0–0.1)
Hemoglobin: 14.6 g/dL (ref 12.0–15.0)
Lymphocytes Relative: 31 % (ref 12.0–46.0)
Monocytes Relative: 12.6 % — ABNORMAL HIGH (ref 3.0–12.0)
Neutro Abs: 2.7 10*3/uL (ref 1.4–7.7)
Neutrophils Relative %: 54.2 % (ref 43.0–77.0)
Platelets: 214 10*3/uL (ref 150.0–400.0)
RDW: 12.9 % (ref 11.5–14.6)

## 2010-11-08 LAB — LDL CHOLESTEROL, DIRECT: Direct LDL: 179.7 mg/dL

## 2010-11-08 LAB — BASIC METABOLIC PANEL
CO2: 28 mEq/L (ref 19–32)
Chloride: 103 mEq/L (ref 96–112)
Potassium: 4.4 mEq/L (ref 3.5–5.1)
Sodium: 139 mEq/L (ref 135–145)

## 2010-11-08 LAB — HEPATIC FUNCTION PANEL
ALT: 72 U/L — ABNORMAL HIGH (ref 0–35)
Albumin: 4.6 g/dL (ref 3.5–5.2)
Alkaline Phosphatase: 66 U/L (ref 39–117)
Total Protein: 7.5 g/dL (ref 6.0–8.3)

## 2010-11-08 LAB — LIPID PANEL
Cholesterol: 284 mg/dL — ABNORMAL HIGH (ref 0–200)
VLDL: 28.6 mg/dL (ref 0.0–40.0)

## 2010-11-08 LAB — URINALYSIS
Ketones, ur: NEGATIVE
Leukocytes, UA: NEGATIVE
Specific Gravity, Urine: 1.01 (ref 1.000–1.030)
Urine Glucose: NEGATIVE
pH: 6 (ref 5.0–8.0)

## 2010-11-08 LAB — SEDIMENTATION RATE: Sed Rate: 17 mm/hr (ref 0–22)

## 2010-11-08 NOTE — Progress Notes (Signed)
Subjective:    Patient ID: [name], female    DOB: 1950/04/05, 60 y.o.   MRN: [id number]  HPI   20 yowf quit smoking 2003 with h/o HBP   June 26, 2008 cpx only c/o = hoarseness and indigestion off and on for up to sev years, worse for several months, better with tums and not eating as much late in evening. no cough or sob over baseline rec trial of diet only to reduce likelihood of gerd   August 18, 2009 cpx c/o occ dizzy, daily drainage issues with cough prod white mucus.     11/08/2010 f/u ov/[name] CPX  No new complaints  Pt denies any significant sore throat, dysphagia, itching, sneezing,has  minimal nasal congestion but no excess secretions, fever, chills, sweats, unintended wt loss, pleuritic or exertional cp, hempoptysis, change in activity tolerance orthopnea pnd or leg swelling. Pt also denies any obvious fluctuation in symptoms with weather or environmental change or other alleviating or aggravating factors.     Allergies  1) ! Pcn     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/1996  DJD R Hip..............................................................................................Marland KitchenMarland KitchenAlusio  HEALTH MAINTENANCE.........................................................................Marland KitchenWert  - GYN = [facility]  - Colonoscopy neg 04/23/2004  - Td 06/2008  - Pneumovax 07/2004  second one  - CPX 11/08/2010  Allergies: Pcn > rash   Family History:  allergies in mother  lymphoma in mother  asthma in brother 4 years older only sibling  colon ca brother  Prostate ca same brother neg cva/aneurysms  IHD Father onset 61s   Social History:  Quit smokng Jan 2003  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  HENT: Positive for congestion. Negative for hearing loss, ear pain, nosebleeds, sore throat, facial swelling, rhinorrhea, sneezing, mouth sores, trouble swallowing, neck pain, neck stiffness, dental problem, voice change, postnasal drip, sinus pressure, tinnitus and ear discharge.   Eyes: Negative for photophobia, discharge, itching and visual disturbance.  Respiratory: Positive for cough. Negative for apnea, choking, chest tightness, shortness of breath, wheezing and stridor.   Cardiovascular: Negative for chest pain, palpitations and leg swelling.  Gastrointestinal: Negative for nausea, vomiting, abdominal pain, constipation, blood in stool and abdominal distention.  Genitourinary: Negative for dysuria, urgency, frequency, hematuria, flank pain, decreased urine volume and difficulty urinating.  Musculoskeletal: Positive for arthralgias. Negative for myalgias, back pain, joint swelling and gait problem.  Skin: Negative for color change, pallor and rash.  Neurological: Negative for dizziness, tremors, seizures, syncope, speech difficulty, weakness, light-headedness, numbness and headaches.  Hematological: Negative for adenopathy. Does not bruise/bleed easily.  Psychiatric/Behavioral: Negative for confusion, sleep disturbance and agitation. The patient is not nervous/anxious.        Objective:   Physical Exam   wt 158 June 26, 2008 > 163 August 18, 2009 > 162 11/08/2010  in general this is a very pleasant healthy alert ambulatory white female in no acute distress with minimal voice fatigue  Afeb with normal vital signs  HEENT: nl dentition, turbinates, and orophanx. Nl external ear canals without cough reflex  Neck without JVD/Nodes/TM  Lungs clear  to A and P bilaterally without cough on insp or exp maneuvers  RRR no s3  or murmur or increase in P2  Abd soft and non tender  Ext warm without calf tenderness, cyanosis clubbing or edema  Skin warm and dry without lesions  Neuro alert, appropriate, no deficits or pathologic reflexes    CXR  11/08/2010 :  Comparison: 08/18/2009  Findings: Stable coarse perihilar interstitial prominence or scarring. There is some mild central peribronchial thickening. No confluent airspace infiltrate or overt interstitial edema. No effusion. Heart size normal. Regional bones unremarkable.  IMPRESSION:  1. No acute disease. Persistent coarse perihilar interstitial prominence.   Assessment & Plan:

## 2010-11-08 NOTE — Telephone Encounter (Signed)
Spoke with pt and notified of results per Dr. Wert. Pt verbalized understanding and denied any questions. 

## 2010-11-08 NOTE — Patient Instructions (Signed)
Please remember to go to the lab and x-ray department downstairs for your tests - we will call you with the results when then are available.  Ok to try reducing the wellbutrin to every other day for a month and then stop   CPX due yearly

## 2010-11-09 NOTE — Assessment & Plan Note (Signed)
Adequate control on present rx, reviewed  

## 2010-11-09 NOTE — Assessment & Plan Note (Addendum)
Not Adequate control on present rx, reviewed  - needs diet and ex and f/u in 3 months to consider medical rx

## 2010-11-09 NOTE — Assessment & Plan Note (Signed)
Studies reviewed, rec repeat dexa "my insurance won't pay for it"

## 2010-11-09 NOTE — Assessment & Plan Note (Signed)
Discussed with Dr Leone Payor, no indication to change recall schedule

## 2010-11-10 ENCOUNTER — Other Ambulatory Visit: Payer: Self-pay | Admitting: Internal Medicine

## 2010-11-11 LAB — VITAMIN D 1,25 DIHYDROXY
Vitamin D 1, 25 (OH)2 Total: 69 pg/mL (ref 18–72)
Vitamin D2 1, 25 (OH)2: 8 pg/mL
Vitamin D3 1, 25 (OH)2: 61 pg/mL

## 2010-12-08 ENCOUNTER — Other Ambulatory Visit: Payer: Self-pay | Admitting: Internal Medicine

## 2011-02-25 ENCOUNTER — Telehealth: Payer: Self-pay | Admitting: Internal Medicine

## 2011-02-25 DIAGNOSIS — E785 Hyperlipidemia, unspecified: Secondary | ICD-10-CM

## 2011-02-25 NOTE — Telephone Encounter (Signed)
I spoke with pt and she states she thought she was suppose to come and have her cholesterol rechecked. I looked in her chart and did not see where this was so. Pt was seen 11/08/11 and was advised to have yearly check ups. Pt wanted [place] to check with MW to make sure she does not need to come in for this. Please advise Dr. Sherene Sires, thanks

## 2011-02-25 NOTE — Telephone Encounter (Signed)
Yes, see under A/P for hyperlipidemia  Needs fasting lipid profile then ov next day dx hyperlipidemia

## 2011-02-25 NOTE — Telephone Encounter (Signed)
lmomtcb x1 for pt 

## 2011-02-28 NOTE — Telephone Encounter (Signed)
I spoke with pt is aware of MW recs. Pt is scheduled to come in and see MW on 03/16/11. Pt aware to have fasting lipid done the day before

## 2011-03-15 ENCOUNTER — Other Ambulatory Visit (INDEPENDENT_AMBULATORY_CARE_PROVIDER_SITE_OTHER): Payer: Federal, State, Local not specified - PPO

## 2011-03-15 DIAGNOSIS — E785 Hyperlipidemia, unspecified: Secondary | ICD-10-CM

## 2011-03-15 LAB — LIPID PANEL
Cholesterol: 256 mg/dL — ABNORMAL HIGH (ref 0–200)
Triglycerides: 256 mg/dL — ABNORMAL HIGH (ref 0.0–149.0)

## 2011-03-16 ENCOUNTER — Encounter: Payer: Self-pay | Admitting: Internal Medicine

## 2011-03-16 ENCOUNTER — Ambulatory Visit (INDEPENDENT_AMBULATORY_CARE_PROVIDER_SITE_OTHER): Payer: Federal, State, Local not specified - PPO | Admitting: Internal Medicine

## 2011-03-16 DIAGNOSIS — M899 Disorder of bone, unspecified: Secondary | ICD-10-CM

## 2011-03-16 DIAGNOSIS — E785 Hyperlipidemia, unspecified: Secondary | ICD-10-CM

## 2011-03-16 DIAGNOSIS — I10 Essential (primary) hypertension: Secondary | ICD-10-CM

## 2011-03-16 DIAGNOSIS — E039 Hypothyroidism, unspecified: Secondary | ICD-10-CM

## 2011-03-16 NOTE — Progress Notes (Signed)
Subjective:    Patient ID: [name], female    DOB: [birth year]    MRN: [id number]  HPI   49 yowf quit smoking 2003 with h/o HBP   June 26, 2008 cpx only c/o = hoarseness and indigestion off and on for up to sev years, worse for several months, better with tums and not eating as much late in evening. no cough or sob over baseline rec trial of diet only to reduce likelihood of gerd   August 18, 2009 cpx c/o occ dizzy, daily drainage issues with cough prod white mucus.     11/08/2010 f/u ov/[name] CPX  No new complaints rec Ok to try reducing the wellbutrin to every other day for a month and then stop     03/16/2011 f/u ov/[name] still on qod wellbutrin here for f/u lipid rx diet/ ex / fish oil no sob, cp, tia or claudication/ cough or sob. cannont tell any change on lower dose wellbutrin  Sleeping ok without nocturnal  or early am exacerbation  of respiratory  c/o's. Also denies any obvious fluctuation of symptoms with weather or environmental changes or other aggravating or alleviating factors except as outlined above   Pt denies any significant sore throat, dysphagia, itching, sneezing,has  minimal nasal congestion but no excess secretions, fever, chills, sweats, unintended wt loss, pleuritic or exertional cp, hempoptysis, change in activity tolerance orthopnea pnd or leg swelling. Pt also denies any obvious fluctuation in symptoms with weather or environmental change or other alleviating or aggravating factors.     Allergies  1) ! Pcn     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/1996  DJD R Hip..............................................................................................Marland KitchenMarland KitchenAlusio  HEALTH  MAINTENANCE.........................................................................Marland KitchenWert  - GYN = [facility]  - Colonoscopy neg 04/23/2004  - Td 06/2008  - Pneumovax 07/2004  second one  - CPX 11/08/2010     Family History:  allergies in mother  lymphoma in mother  asthma in brother 4 years older only sibling  colon ca brother  Prostate ca same brother neg cva/aneurysms  IHD Father onset 3s   Social History:  Quit smokng Jan 2003  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 Nl external ear canals without cough reflex  Neck without JVD/Nodes/TM  Lungs clear to A and P bilaterally without cough on insp or exp maneuvers  RRR no s3 or murmur or increase in P2  Abd soft and non tender  Ext warm without calf tenderness, cyanosis clubbing or edema  Skin warm and dry without lesions  Ns    CXR  11/08/2010 :  Comparison: 08/18/2009  Findings: Stable coarse perihilar interstitial prominence or scarring. There is some mild central peribronchial thickening. No confluent airspace infiltrate or overt interstitial edema. No effusion. Heart size normal. Regional bones unremarkable.  IMPRESSION:  1. No acute disease. Persistent coarse  perihilar interstitial prominence.   Assessment & Plan:

## 2011-03-16 NOTE — Patient Instructions (Signed)
Weight control is simply a matter of calorie balance which needs to be tilted in your favor by eating less and exercising more.  To get the most out of exercise, you need to be continuously aware that you are short of breath, but never out of breath, for 30 minutes daily. As you improve, it will actually be easier for you to do the same amount of exercise  in  30 minutes so always push to the level where you are short of breath.  If this does not result in gradual weight reduction then I strongly recommend you see a nutritionist with a food diary x 2 weeks so that we can work out a negative calorie balance which is universally effective in steady weight loss programs.  Think of your calorie balance like you do your bank account where in this case you want the balance to go down so you must take in less calories than you burn up.  It's just that simple:  Hard to do, but easy to understand.  Good luck!   Baby aspirin daily.  Ok to wellbutrin  Ok to repeat your cholesterol profile in 3 months  Return here in October after the 22nd for physical

## 2011-03-17 NOTE — Assessment & Plan Note (Signed)
Lab Results  Component Value Date   CHOL 256* 03/15/2011   HDL 57.70 03/15/2011   LDLDIRECT 164.0 03/15/2011   TRIG 256.0* 03/15/2011   CHOLHDL 4 03/15/2011    Improved but not at goal  Discussed in detail all the  indications, usual  risks and alternatives  relative to the benefits with patient who wants to continue with diet and wt loss for now given the info on the NNT for lipid drugs

## 2011-03-17 NOTE — Assessment & Plan Note (Signed)
Wishes to defer next dexa to age 61.

## 2011-03-17 NOTE — Assessment & Plan Note (Signed)
Lab Results  Component Value Date   TSH 2.85 11/08/2010   Adequate control on present rx, reviewed

## 2011-03-17 NOTE — Assessment & Plan Note (Signed)
Adequate control on present rx, reviewed  

## 2011-05-13 ENCOUNTER — Other Ambulatory Visit: Payer: Self-pay | Admitting: Internal Medicine

## 2011-07-15 ENCOUNTER — Telehealth: Payer: Self-pay | Admitting: Internal Medicine

## 2011-07-15 NOTE — Telephone Encounter (Signed)
Spoke with pharmacist and advised to switch to levothyroxine same strength. She verbalized understanding and states nothing further needed.

## 2011-07-15 NOTE — Telephone Encounter (Signed)
[facility]  Brand Levoxyl is on long term back order  Can they change this to synthroid or levothyroxine ?  Dr Sherene Sires Please advise . Thank you

## 2011-07-15 NOTE — Telephone Encounter (Signed)
Ok strength is the same

## 2011-08-02 ENCOUNTER — Other Ambulatory Visit: Payer: Self-pay | Admitting: Obstetrics and Gynecology

## 2011-08-02 ENCOUNTER — Other Ambulatory Visit: Payer: Self-pay | Admitting: Nurse Practitioner

## 2011-08-02 DIAGNOSIS — Z1231 Encounter for screening mammogram for malignant neoplasm of breast: Secondary | ICD-10-CM

## 2011-08-19 ENCOUNTER — Ambulatory Visit (HOSPITAL_COMMUNITY)
Admission: RE | Admit: 2011-08-19 | Discharge: 2011-08-19 | Disposition: A | Discharge status: Home / Self Care | Payer: Federal, State, Local not specified - PPO | Source: Ambulatory Visit | Attending: Obstetrics and Gynecology | Admitting: Obstetrics and Gynecology

## 2011-08-19 DIAGNOSIS — Z1231 Encounter for screening mammogram for malignant neoplasm of breast: Secondary | ICD-10-CM | POA: Insufficient documentation

## 2011-11-14 ENCOUNTER — Other Ambulatory Visit: Payer: Self-pay | Admitting: Internal Medicine

## 2011-11-30 ENCOUNTER — Ambulatory Visit (INDEPENDENT_AMBULATORY_CARE_PROVIDER_SITE_OTHER)
Admission: RE | Admit: 2011-11-30 | Discharge: 2011-11-30 | Disposition: A | Discharge status: Home / Self Care | Payer: Federal, State, Local not specified - PPO | Source: Ambulatory Visit | Attending: Internal Medicine | Admitting: Internal Medicine

## 2011-11-30 ENCOUNTER — Ambulatory Visit (INDEPENDENT_AMBULATORY_CARE_PROVIDER_SITE_OTHER): Payer: Federal, State, Local not specified - PPO | Admitting: Internal Medicine

## 2011-11-30 ENCOUNTER — Other Ambulatory Visit (INDEPENDENT_AMBULATORY_CARE_PROVIDER_SITE_OTHER): Payer: Federal, State, Local not specified - PPO

## 2011-11-30 ENCOUNTER — Encounter: Payer: Self-pay | Admitting: Internal Medicine

## 2011-11-30 VITALS — BP 132/80 | HR 79 | Temp 97.5°F | Ht 65.0 in | Wt 159.0 lb

## 2011-11-30 DIAGNOSIS — E785 Hyperlipidemia, unspecified: Secondary | ICD-10-CM

## 2011-11-30 DIAGNOSIS — Z8371 Family history of colonic polyps: Secondary | ICD-10-CM

## 2011-11-30 DIAGNOSIS — M949 Disorder of cartilage, unspecified: Secondary | ICD-10-CM

## 2011-11-30 DIAGNOSIS — I10 Essential (primary) hypertension: Secondary | ICD-10-CM

## 2011-11-30 DIAGNOSIS — E039 Hypothyroidism, unspecified: Secondary | ICD-10-CM

## 2011-11-30 DIAGNOSIS — M899 Disorder of bone, unspecified: Secondary | ICD-10-CM

## 2011-11-30 DIAGNOSIS — M2550 Pain in unspecified joint: Secondary | ICD-10-CM

## 2011-11-30 DIAGNOSIS — Z83719 Family history of colon polyps, unspecified: Secondary | ICD-10-CM

## 2011-11-30 LAB — URINALYSIS
Bilirubin Urine: NEGATIVE
Ketones, ur: NEGATIVE
Specific Gravity, Urine: 1.01 (ref 1.000–1.030)
Total Protein, Urine: NEGATIVE
Urine Glucose: NEGATIVE
pH: 6 (ref 5.0–8.0)

## 2011-11-30 LAB — BASIC METABOLIC PANEL
BUN: 18 mg/dL (ref 6–23)
CO2: 27 mEq/L (ref 19–32)
Chloride: 99 mEq/L (ref 96–112)
Creatinine, Ser: 0.9 mg/dL (ref 0.4–1.2)
Potassium: 4.3 mEq/L (ref 3.5–5.1)

## 2011-11-30 LAB — HEPATIC FUNCTION PANEL
ALT: 71 U/L — ABNORMAL HIGH (ref 0–35)
Bilirubin, Direct: 0.1 mg/dL (ref 0.0–0.3)
Total Protein: 7.8 g/dL (ref 6.0–8.3)

## 2011-11-30 LAB — CBC WITH DIFFERENTIAL/PLATELET
Basophils Relative: 0.4 % (ref 0.0–3.0)
Eosinophils Absolute: 0.1 10*3/uL (ref 0.0–0.7)
Eosinophils Relative: 2.2 % (ref 0.0–5.0)
HCT: 45.4 % (ref 36.0–46.0)
Lymphs Abs: 1.6 10*3/uL (ref 0.7–4.0)
MCHC: 33.5 g/dL (ref 30.0–36.0)
MCV: 90.9 fl (ref 78.0–100.0)
Monocytes Absolute: 0.6 10*3/uL (ref 0.1–1.0)
Neutrophils Relative %: 58.5 % (ref 43.0–77.0)
Platelets: 224 10*3/uL (ref 150.0–400.0)
RBC: 4.99 Mil/uL (ref 3.87–5.11)

## 2011-11-30 LAB — LDL CHOLESTEROL, DIRECT: Direct LDL: 183.3 mg/dL

## 2011-11-30 LAB — TSH: TSH: 3.21 u[IU]/mL (ref 0.35–5.50)

## 2011-11-30 LAB — LIPID PANEL
Cholesterol: 285 mg/dL — ABNORMAL HIGH (ref 0–200)
Total CHOL/HDL Ratio: 5
Triglycerides: 206 mg/dL — ABNORMAL HIGH (ref 0.0–149.0)

## 2011-11-30 NOTE — Progress Notes (Signed)
Subjective:    Patient ID: [name], female    DOB: Aug 13, 1950    MRN: [id number]  HPI   12 yowf quit smoking 2003 with h/o HBP and ? rml syndrome.  June 26, 2008 cpx only c/o = hoarseness and indigestion off and on for up to sev years, worse for several months, better with tums and not eating as much late in evening. no cough or sob over baseline rec trial of diet only to reduce likelihood of gerd    11/08/2010 f/u ov/[name] CPX  No new complaints rec Ok to try reducing the wellbutrin to every other day for a month and then stop     03/16/2011 f/u ov/[name] still on qod wellbutrin here for f/u lipid rx diet/ ex / fish oil no sob, cp, tia or claudication/ cough or sob. cannont tell any change on lower dose wellbutrin rec Weight control via cal balance Baby aspirin daily. Ok to stop wellbutrin> remained on tiw dosing    11/30/2011 f/u ov/[name] CPX only complaints but feels "feverish" several days a month,  X 6 month, ex at [facility] limited by R hip, not sob or cp or claudication.  No cough  Sleeping ok without nocturnal  or early am exacerbation  of respiratory  c/o's. Also denies any obvious fluctuation of symptoms with weather or environmental changes or other aggravating or alleviating factors except as outlined above   ROS  The following are not active complaints unless bolded sore throat, dysphagia, dental problems, itching, sneezing,  nasal congestion or excess/ purulent secretions, ear ache,   fever, chills, sweats, unintended wt loss, pleuritic or exertional cp, hemoptysis,  orthopnea pnd or leg swelling, presyncope, palpitations, heartburn, abdominal pain, anorexia, nausea, vomiting, diarrhea  or change in bowel or urinary habits, change in stools or urine, dysuria,hematuria,  rash, arthralgias R Hip, visual complaints, headache, numbness weakness or ataxia or problems with walking or coordination,  change in mood/affect or memory.         Allergies  1) ! Pcn     Past  Medical History:  HYPERTENSION (ICD-401.9)  HYPOTHYROIDISM (ICD-244.9)  DYSLIPIDEMIA (ICD-272.4)  - Taret ldl < 130 pos fm hx, h/o smoking, hbp  OSTEOPENIA (ICD-733.90)  - DEXA 09/20/06  T spine-.7, Left Fem Neck -1.9, Right Fem neck -1.1 > declined f/u study August 18, 2009  RML Syndorme  - FOB 09/18/1996  DJD R Hip..............................................................................................Marland KitchenMarland KitchenAlusio  HEALTH MAINTENANCE.........................................................................Marland KitchenWert  - GYN = Grubb PA  - Colonoscopy neg 04/23/2004  - Td 06/2008  - Pneumovax 07/2004  second one  - CPX 11/30/2011     Family History:  allergies in mother  lymphoma in mother  asthma in brother 4 years older only sibling  colon ca brother  Prostate ca same brother neg cva/aneurysms  IHD Father onset 67s   Social History:  Quit smokng Jan 2003  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 Nl external ear canals without cough reflex  Neck without JVD/Nodes/TM  Lungs clear to A and P bilaterally without cough on insp or exp maneuvers  RRR no s3 or murmur or increase in P2  Abd  soft and non tender  Ext warm without calf tenderness, cyanosis clubbing or edema  Skin warm and dry without lesions  MS min limitation R hip rotation, nl gait. Neuro alert, approp, no motor or cerebellar def or pathologic reflexes  Rectal/ gyn:  Per Ulice Bold    CXR  11/30/2011 : Chronic changes without acute abnormality.      Assessment & Plan:

## 2011-11-30 NOTE — Patient Instructions (Signed)
Please remember to go to the lab and x-ray department downstairs for your tests - we will call you with the results when they are available.    If cough develops stop the fish oil completely and substitute two servings of salmon a week  Follow up in one year, sooner if needed

## 2011-12-01 NOTE — Progress Notes (Signed)
Quick Note:  Spoke with pt and notified of results per Dr. Wert. Pt verbalized understanding and denied any questions.  ______ 

## 2011-12-02 NOTE — Assessment & Plan Note (Signed)
In computer for recall colonoscopy, reviewed

## 2011-12-02 NOTE — Assessment & Plan Note (Signed)
Target ldl < 130 pos fm hx, h/o smoking, hbp  Lab Results  Component Value Date   CHOL 285* 11/30/2011   HDL 61.30 11/30/2011   LDLDIRECT 183.3 11/30/2011   TRIG 206.0* 11/30/2011   CHOLHDL 5 11/30/2011     LDL drifting higher, luckily hdl also higher.   rec diet/ ex and regroup in 3 months re ? Statin rx

## 2011-12-02 NOTE — Assessment & Plan Note (Signed)
-   DEXA 08/06/2004   - 1.25  PA Spine  Vs  -1.96  L Hip  and - 2.1 R Hip    -  DEXA 09/20/06  T spine-.7, Left Fem Neck -1.9, Right Fem neck -1.1 > declined f/u study August 18, 2009   Wants to wait until age 61 to repeat, on adequate calcium plus uses hctz to control bp

## 2011-12-02 NOTE — Assessment & Plan Note (Signed)
Adequate control on present rx, reviewed risk/ benefit to eventual hip surgery. For now wants to postpone but if really begins to limit her activity, esp aerobics, she runs the risk of wt gain, deconditioning and worsening hyperlipidemia.

## 2011-12-02 NOTE — Assessment & Plan Note (Signed)
Lab Results  Component Value Date   TSH 3.21 11/30/2011    Adequate control on present rx, reviewed

## 2011-12-02 NOTE — Assessment & Plan Note (Signed)
Adequate control on present rx, reviewed  

## 2012-05-16 ENCOUNTER — Other Ambulatory Visit: Payer: Self-pay | Admitting: Internal Medicine

## 2012-11-13 ENCOUNTER — Other Ambulatory Visit: Payer: Self-pay | Admitting: Nurse Practitioner

## 2012-11-13 DIAGNOSIS — Z1231 Encounter for screening mammogram for malignant neoplasm of breast: Secondary | ICD-10-CM

## 2012-11-22 ENCOUNTER — Other Ambulatory Visit: Payer: Self-pay

## 2012-12-02 ENCOUNTER — Other Ambulatory Visit: Payer: Self-pay | Admitting: Nurse Practitioner

## 2012-12-03 NOTE — Telephone Encounter (Signed)
Spoke with pt. And states has been taking sample until now.  Has lost Rx and requesting new Rx to be called to pharmacy. Explained to pt. Usually a $10 charge for this but will not charge this time.  Will Escribe Vagifem.

## 2012-12-03 NOTE — Telephone Encounter (Signed)
LMOVM to call regarding refill on Vagifem.  Pt. Had AEX 02/2011 with P.Grubb and given a RX for 1 year(should have refills).

## 2012-12-05 ENCOUNTER — Ambulatory Visit (HOSPITAL_COMMUNITY)
Admission: RE | Admit: 2012-12-05 | Discharge: 2012-12-05 | Disposition: A | Discharge status: Home / Self Care | Payer: Federal, State, Local not specified - PPO | Source: Ambulatory Visit | Attending: Nurse Practitioner | Admitting: Nurse Practitioner

## 2012-12-05 DIAGNOSIS — Z1231 Encounter for screening mammogram for malignant neoplasm of breast: Secondary | ICD-10-CM | POA: Insufficient documentation

## 2012-12-11 ENCOUNTER — Ambulatory Visit: Payer: Federal, State, Local not specified - PPO | Admitting: Internal Medicine

## 2012-12-16 ENCOUNTER — Other Ambulatory Visit: Payer: Self-pay | Admitting: Internal Medicine

## 2012-12-19 ENCOUNTER — Ambulatory Visit: Payer: Federal, State, Local not specified - PPO | Admitting: Internal Medicine

## 2012-12-26 ENCOUNTER — Other Ambulatory Visit (INDEPENDENT_AMBULATORY_CARE_PROVIDER_SITE_OTHER): Payer: Federal, State, Local not specified - PPO

## 2012-12-26 ENCOUNTER — Ambulatory Visit (INDEPENDENT_AMBULATORY_CARE_PROVIDER_SITE_OTHER)
Admission: RE | Admit: 2012-12-26 | Discharge: 2012-12-26 | Disposition: A | Discharge status: Home / Self Care | Payer: Federal, State, Local not specified - PPO | Source: Ambulatory Visit | Attending: Internal Medicine | Admitting: Internal Medicine

## 2012-12-26 ENCOUNTER — Ambulatory Visit (INDEPENDENT_AMBULATORY_CARE_PROVIDER_SITE_OTHER): Payer: Federal, State, Local not specified - PPO | Admitting: Internal Medicine

## 2012-12-26 ENCOUNTER — Encounter: Payer: Self-pay | Admitting: Internal Medicine

## 2012-12-26 VITALS — BP 114/70 | HR 73 | Temp 97.9°F | Ht 64.5 in | Wt 164.0 lb

## 2012-12-26 DIAGNOSIS — I10 Essential (primary) hypertension: Secondary | ICD-10-CM

## 2012-12-26 DIAGNOSIS — E039 Hypothyroidism, unspecified: Secondary | ICD-10-CM

## 2012-12-26 DIAGNOSIS — M899 Disorder of bone, unspecified: Secondary | ICD-10-CM

## 2012-12-26 DIAGNOSIS — E785 Hyperlipidemia, unspecified: Secondary | ICD-10-CM

## 2012-12-26 DIAGNOSIS — Z8371 Family history of colonic polyps: Secondary | ICD-10-CM

## 2012-12-26 LAB — CBC WITH DIFFERENTIAL/PLATELET
Basophils Absolute: 0 10*3/uL (ref 0.0–0.1)
Basophils Relative: 0.4 % (ref 0.0–3.0)
Eosinophils Absolute: 0.2 10*3/uL (ref 0.0–0.7)
Hemoglobin: 13.6 g/dL (ref 12.0–15.0)
Lymphocytes Relative: 29.4 % (ref 12.0–46.0)
MCHC: 34.1 g/dL (ref 30.0–36.0)
MCV: 88.1 fl (ref 78.0–100.0)
Monocytes Absolute: 0.6 10*3/uL (ref 0.1–1.0)
Monocytes Relative: 9.5 % (ref 3.0–12.0)
Neutro Abs: 3.7 10*3/uL (ref 1.4–7.7)
Platelets: 210 10*3/uL (ref 150.0–400.0)
RBC: 4.52 Mil/uL (ref 3.87–5.11)
RDW: 12.9 % (ref 11.5–14.6)

## 2012-12-26 LAB — URINALYSIS, ROUTINE W REFLEX MICROSCOPIC
Bilirubin Urine: NEGATIVE
Hgb urine dipstick: NEGATIVE
Ketones, ur: NEGATIVE
RBC / HPF: NONE SEEN (ref 0–?)
Specific Gravity, Urine: 1.015 (ref 1.000–1.030)
Urine Glucose: NEGATIVE
Urobilinogen, UA: 0.2 (ref 0.0–1.0)
pH: 6 (ref 5.0–8.0)

## 2012-12-26 LAB — BASIC METABOLIC PANEL
BUN: 15 mg/dL (ref 6–23)
Creatinine, Ser: 0.8 mg/dL (ref 0.4–1.2)
GFR: 72.41 mL/min (ref >60.00)
Glucose, Bld: 119 mg/dL — ABNORMAL HIGH (ref 70–99)
Potassium: 4.1 mEq/L (ref 3.5–5.1)

## 2012-12-26 LAB — HEPATIC FUNCTION PANEL
AST: 87 U/L — ABNORMAL HIGH (ref 0–37)
Albumin: 4.4 g/dL (ref 3.5–5.2)
Alkaline Phosphatase: 76 U/L (ref 39–117)
Total Protein: 7.6 g/dL (ref 6.0–8.3)

## 2012-12-26 LAB — LIPID PANEL
Cholesterol: 252 mg/dL — ABNORMAL HIGH (ref 0–200)
HDL: 63.5 mg/dL (ref >39.00)
Triglycerides: 157 mg/dL — ABNORMAL HIGH (ref 0.0–149.0)
VLDL: 31.4 mg/dL (ref 0.0–40.0)

## 2012-12-26 NOTE — Progress Notes (Signed)
Subjective:    Patient ID: [name], female    DOB: 1950-10-23    MRN: [id number]  HPI   35 yowf quit smoking 2003 with h/o HBP and ? rml syndrome.  June 26, 2008 cpx only c/o = hoarseness and indigestion off and on for up to sev years, worse for several months, better with tums and not eating as much late in evening. no cough or sob over baseline rec trial of diet only to reduce likelihood of gerd    11/08/2010 f/u ov/Mae Cianci CPX  No new complaints rec Ok to try reducing the wellbutrin to every other day for a month and then stop     03/16/2011 f/u ov/Esha Fincher still on qod wellbutrin here for f/u lipid rx diet/ ex / fish oil no sob, cp, tia or claudication/ cough or sob. cannont tell any change on lower dose wellbutrin rec Weight control via cal balance Baby aspirin daily. Ok to stop wellbutrin> remained on tiw dosing    11/30/2011 f/u ov/Kendale Rembold CPX only complaints but feels "feverish" several days a month,  X 6 month, ex [facility] limited by R hip, not sob or cp or claudication.  No cough. rec Please remember to go to the lab and x-ray department downstairs for your tests - we will call you with the results when they are available. If cough develops stop the fish oil completely and substitute two servings of salmon a week Follow up in one year, sooner if needed   12/26/2012  Annual f/u ov/Alcario Tinkey re: hbp/ low thyroid/ Chief Complaint  Patient presents with  . Annual Exam    Pt states that she is doing well and denies any co's today. She wonders about d/c BP med.       No tia, claudication, ex cp or sob/ cough  Sleeping ok without nocturnal  or early am exacerbation  of respiratory  c/o's. Also denies any obvious fluctuation of symptoms with weather or environmental changes or other aggravating or alleviating factors except as outlined above   ROS  The following are not active complaints unless bolded sore throat, dysphagia, dental problems, itching, sneezing,  nasal congestion or  excess/ purulent secretions, ear ache,   fever, chills, sweats, unintended wt loss, pleuritic or exertional cp, hemoptysis,  orthopnea pnd or leg swelling, presyncope, palpitations, heartburn, abdominal pain, anorexia, nausea, vomiting, diarrhea  or change in bowel or urinary habits, change in stools or urine, dysuria,hematuria,  rash, arthralgias R Hip, visual complaints, headache, numbness weakness or ataxia or problems with walking or coordination,  change in mood/affect or memory.         Allergies  1) ! Pcn     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/1996  DJD R Hip..............................................................................................Marland KitchenMarland KitchenAlusio  HEALTH MAINTENANCE.........................................................................Marland KitchenWert  - GYN = Grubb PA  - Colonoscopy neg 04/23/2004  - Td 06/2008  - Pneumovax 07/2004  second one  - CPX  12/26/2012     Family History:  allergies in mother  lymphoma in mother  asthma in brother 4 years older only sibling  colon ca brother  Prostate ca same brother neg cva/aneurysms  IHD Father onset 54s   Social History:  Quit smokng Jan 2003  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 Nl external ear canals without cough reflex  Neck without JVD/Nodes/TM  Lungs clear to A and P bilaterally without cough on insp or exp maneuvers  RRR no s3 or  murmur or increase in P2  Abd soft and non tender  Ext warm without calf tenderness, cyanosis clubbing or edema  Skin warm and dry without lesions  MS min limitation R hip rotation, nl gait. Neuro alert, approp, no motor or cerebellar def or pathologic reflexes  Rectal/ gyn:  Per Ulice Bold    CXR  12/26/2012 :  1. No acute findings. 2. Chronic interstitial coarsening.      Assessment & Plan:

## 2012-12-26 NOTE — Progress Notes (Signed)
Quick Note:  LMTCB ______ 

## 2012-12-26 NOTE — Patient Instructions (Addendum)
Please remember to go to the lab and x-ray department downstairs for your tests - we will call you with the results when they are available.     Return yearly for comprehensive evaluation.

## 2012-12-27 ENCOUNTER — Telehealth: Payer: Self-pay | Admitting: Internal Medicine

## 2012-12-27 NOTE — Progress Notes (Signed)
Quick Note:  Spoke with pt and notified of results per Dr. Wert. Pt verbalized understanding and denied any questions.  ______ 

## 2012-12-27 NOTE — Telephone Encounter (Signed)
Pt requested lab results.  Theses results and recs given.  Pt has no further questions. Nothing further is needed

## 2012-12-27 NOTE — Progress Notes (Signed)
Quick Note:  LMTCB ______ 

## 2012-12-30 NOTE — Assessment & Plan Note (Signed)
Lab Results  Component Value Date   TSH 2.43 12/26/2012    Adequate control on present rx, reviewed > no change in rx needed

## 2012-12-30 NOTE — Assessment & Plan Note (Signed)
Adequate control on present rx, reviewed > no change in rx needed   Reminded hctz helps with calcium balance

## 2012-12-30 NOTE — Assessment & Plan Note (Signed)
-   DEXA 08/06/2004   - 1.25  PA Spine  Vs  -1.96  L Hip  and - 2.1 R Hip    -  DEXA 09/20/06  T spine-.7, Left Fem Neck -1.9, Right Fem neck -1.1 > declined f/u study August 18, 2009   On hctz/ ca/ vit d supplements  dexa due but wants to wait until age 62

## 2012-12-30 NOTE — Assessment & Plan Note (Signed)
Target ldl < 130 pos fm hx, h/o smoking, hbp  Lab Results  Component Value Date   CHOL 252* 12/26/2012   HDL 63.50 12/26/2012   LDLDIRECT 170.1 12/26/2012   TRIG 157.0* 12/26/2012   CHOLHDL 4 12/26/2012     ldl too high but hdl somewhat protective > rec diet and ex for now, revisit next year

## 2013-01-08 ENCOUNTER — Telehealth: Payer: Self-pay | Admitting: Internal Medicine

## 2013-01-08 MED ORDER — OSELTAMIVIR PHOSPHATE 75 MG PO CAPS: 75.0000 mg | ORAL_CAPSULE | Freq: Two times a day (BID) | ORAL | Status: DC

## 2013-01-08 NOTE — Telephone Encounter (Signed)
i called and pt. Aware of recs. Nothing further needed

## 2013-01-08 NOTE — Telephone Encounter (Signed)
Spoke with pt. Reports dry cough, fever, body aches, congestion and ear pain. Temp is 102. Sx started last night. Did not have her flu shot this year. Took Tylenol to bring fever down. Requests recs from MW.  MW - please advise. Thanks.

## 2013-01-08 NOTE — Telephone Encounter (Signed)
rx tamiflu/ fluids/ rest

## 2013-02-26 ENCOUNTER — Other Ambulatory Visit: Payer: Self-pay | Admitting: Internal Medicine

## 2013-03-07 ENCOUNTER — Ambulatory Visit: Payer: Self-pay | Admitting: Nurse Practitioner

## 2013-03-17 ENCOUNTER — Other Ambulatory Visit: Payer: Self-pay | Admitting: Nurse Practitioner

## 2013-03-18 NOTE — Telephone Encounter (Signed)
Last refilled: AEX 03/05/12 refills x 1 year  AEX Scheduled: 04/09/13 with Ms. Patty   (Chart In Your Door)  Please Advise.

## 2013-03-22 ENCOUNTER — Telehealth: Payer: Self-pay | Admitting: Nurse Practitioner

## 2013-03-22 MED ORDER — NONFORMULARY OR COMPOUNDED ITEM: Status: DC

## 2013-03-22 NOTE — Telephone Encounter (Signed)
Pt says her pharmacy said  the wrong medication was called in for her and that they have contacted [place] to get clarification and have not heard anything.

## 2013-03-22 NOTE — Telephone Encounter (Signed)
Spoke with Milford Cage, FNP and rx confirmed.   Order sent to [facility] and patient aware.   Routing to provider for final review. Patient agreeable to disposition. Will close encounter

## 2013-04-09 ENCOUNTER — Ambulatory Visit: Payer: Self-pay | Admitting: Nurse Practitioner

## 2013-04-17 ENCOUNTER — Encounter: Payer: Self-pay | Admitting: *Deleted

## 2013-04-17 DIAGNOSIS — H43392 Other vitreous opacities, left eye: Secondary | ICD-10-CM

## 2013-04-17 HISTORY — DX: Other vitreous opacities, left eye: H43.392

## 2013-04-18 ENCOUNTER — Ambulatory Visit: Payer: Self-pay | Admitting: Nurse Practitioner

## 2013-04-30 ENCOUNTER — Ambulatory Visit (INDEPENDENT_AMBULATORY_CARE_PROVIDER_SITE_OTHER): Payer: Medicare Other | Admitting: Nurse Practitioner

## 2013-04-30 ENCOUNTER — Encounter: Payer: Self-pay | Admitting: Nurse Practitioner

## 2013-04-30 VITALS — BP 124/72 | HR 68 | Ht 64.5 in | Wt 165.0 lb

## 2013-04-30 DIAGNOSIS — Z124 Encounter for screening for malignant neoplasm of cervix: Secondary | ICD-10-CM

## 2013-04-30 DIAGNOSIS — Z01419 Encounter for gynecological examination (general) (routine) without abnormal findings: Secondary | ICD-10-CM

## 2013-04-30 DIAGNOSIS — Z1211 Encounter for screening for malignant neoplasm of colon: Secondary | ICD-10-CM

## 2013-04-30 MED ORDER — ESTRADIOL 10 MCG VA TABS: 10.0000 ug | ORAL_TABLET | VAGINAL | Status: DC

## 2013-04-30 NOTE — Patient Instructions (Signed)

## 2013-04-30 NOTE — Progress Notes (Signed)
Patient ID: [name], female   DOB: 09-12-1950, 63 y.o.   MRN: [id number] 62 y.o. G0P0 Single Caucasian Fe here for annual exam.  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  She is having increase pain right hip and may need to schedule hip replacement in the near future. Anterior approach has been discussed as this has favorable outcomes and less time in recover.  Patient's last menstrual period was 01/17/2001.          Sexually active: no  The current method of family planning is post menopausal status.    Exercising: yes  Gym/ health club routine includes walking and classes at [employer] Smoker:  no  Health Maintenance: Pap:  10/15/09, WNL MMG:  12/05/12, Bi-Rads 1: negative Colonoscopy:  04/23/04, repeat in 10 years BMD:  07/2004, -1.25/-1.98 TDaP:  06/2008 Labs:  12/2012 in EPIC   reports that she quit smoking about 12 years ago. Her smoking use included Cigarettes. She has a 10 pack-year smoking history. She has never used smokeless tobacco. She reports that she drinks about 2 - 2.5 ounces of alcohol per week. She reports that she does not use illicit drugs.  Past Medical History  Diagnosis Date  . Hypertension   . Hypothyroidism   . Dyslipidemia   . Osteopenia   . DJD (degenerative joint disease) of hip     right - Alusio  . Vitreous floaters of left eye 04/2013    Past Surgical History  Procedure Laterality Date  . Tonsillectomy and adenoidectomy  child age 47  . Flexible bronchoscopy  1998    for pneumonia recurrent    Current Outpatient Prescriptions  Medication Sig Dispense Refill  . aspirin 81 MG tablet Take 81 mg by mouth. five times weekly      . buPROPion (WELLBUTRIN SR) 150 MG 12 hr tablet       . buPROPion (WELLBUTRIN SR) 150 MG 12 hr tablet 1 tablet three times per wk      . buPROPion (WELLBUTRIN SR) 150 MG 12 hr tablet TAKE 1 TABLET BY MOUTH DAILY  30 tablet  11  . calcium carbonate (TUMS - DOSED IN MG ELEMENTAL CALCIUM) 500 MG chewable  tablet Chew 1 tablet by mouth as needed.        . diphenhydrAMINE (BENADRYL) 25 MG tablet Take 25 mg by mouth at bedtime as needed.        . Estradiol (VAGIFEM) 10 MCG TABS vaginal tablet Place 1 tablet (10 mcg total) vaginally 2 (two) times a week. 1 tablet intravaginally twice weekly  24 tablet  3  . levothyroxine (SYNTHROID, LEVOTHROID) 25 MCG tablet take every mon wed fri      . levothyroxine (SYNTHROID, LEVOTHROID) 25 MCG tablet TAKE AS DIRECTED  30 tablet  11  . Multiple Vitamin (MULTIVITAMIN) tablet Take 1 tablet by mouth daily.        . naftifine (NAFTIN) 1 % cream Apply 1 application topically daily.      . NONFORMULARY OR COMPOUNDED ITEM Triamcinolone 0.1% and Silvadene Cream 3:1 Compound. Apply as directed bid prn.Dispense 4 oz.  1 each  12  . Omega-3 Fatty Acids (FISH OIL) 1000 MG CAPS Take 2 capsules by mouth daily.      Marland Kitchen oseltamivir (TAMIFLU) 75 MG capsule Take 1 capsule (75 mg total) by mouth 2 (two) times daily.  10 capsule  0  . ranitidine (ZANTAC) 150 MG tablet Take 150 mg by mouth daily  as needed.        . sodium chloride (OCEAN) 0.65 % nasal spray Place 1 spray into the nose as needed.        . triamcinolone cream (KENALOG) 0.1 % USE TWICE DAILY AS NEEDED  454 g  0  . triamterene-hydrochlorothiazide (MAXZIDE-25) 37.5-25 MG per tablet TAKE 1 TABLET BY MOUTH EVERY DAY  30 tablet  11   No current facility-administered medications for this visit.    Family History  Problem Relation Age of Onset  . Allergies Mother   . Lymphoma Mother   . Asthma Brother   . Cancer Brother   . Colon cancer Brother 17  . Other Father     IHD    ROS:  Pertinent items are noted in HPI.  Otherwise, a comprehensive ROS was negative.  Exam:   BP 124/72  Pulse 68  Ht 5' 4.5" (1.638 m)  Wt 165 lb (74.844 kg)  BMI 27.90 kg/m2  LMP 01/17/2001 Height: 5' 4.5" (163.8 cm)  Ht Readings from Last 3 Encounters:  04/30/13 5' 4.5" (1.638 m)  12/26/12 5' 4.5" (1.638 m)  11/30/11 5\' 5"  (1.651 m)     General appearance: alert, cooperative and appears stated age Head: Normocephalic, without obvious abnormality, atraumatic Neck: no adenopathy, supple, symmetrical, trachea midline and thyroid normal to inspection and palpation Lungs: clear to auscultation bilaterally Breasts: normal appearance, no masses or tenderness Heart: regular rate and rhythm Abdomen: soft, non-tender; no masses,  no organomegaly Extremities: extremities normal, atraumatic, no cyanosis or edema Skin: Skin color, texture, turgor normal. No rashes or lesions Lymph nodes: Cervical, supraclavicular, and axillary nodes normal. No abnormal inguinal nodes palpated Neurologic: Grossly normal   Pelvic: External genitalia:  no lesions              Urethra:  Not as prolapsed appearing urethra with no masses, tenderness or lesions              Bartholin's and Skene's: normal                 Vagina: atrophic appearing vagina with pale color and no discharge, no lesions              Cervix: anteverted              Pap taken: no Bimanual Exam:  Uterus:  normal size, contour, position, consistency, mobility, non-tender              Adnexa: no mass, fullness, tenderness               Rectovaginal: Confirms               Anus:  normal sphincter tone, no lesions  A:  Well Woman with normal exam  Postmenopausal  Atrophic vaginitis on Vagifem  Right hip pain secondary to arthritis  Situational depression  P:   Pap smear as per guidelines   Mammogram is due 11/15  IFOB is given  Refill on Vagifem twice weekly  Counseled with potentia risk of DVT, CVA, cancer, etc  She is in agreement that if she gets hip replacement to come off Vagifem a month prior and for 2- 3 months after surgery until she has recovered to reduce risk of DVT.  Counseled on breast self exam, mammography screening, menopause, osteoporosis, adequate intake of calcium and vitamin D, diet and exercise, Kegel's exercises return annually or prn  An After  Visit Summary was printed and given to the patient.

## 2013-04-30 NOTE — Progress Notes (Signed)
Encounter reviewed by Dr. Brook Silva.  

## 2013-05-16 ENCOUNTER — Other Ambulatory Visit: Payer: Self-pay | Admitting: Internal Medicine

## 2013-08-01 ENCOUNTER — Other Ambulatory Visit: Payer: Self-pay | Admitting: Orthopedic Surgery

## 2013-08-07 ENCOUNTER — Encounter (HOSPITAL_COMMUNITY): Payer: Self-pay | Admitting: Pharmacy Technician

## 2013-08-13 ENCOUNTER — Other Ambulatory Visit: Payer: Self-pay | Admitting: Orthopedic Surgery

## 2013-08-13 NOTE — Patient Instructions (Addendum)
[name]  08/13/2013   Your procedure is scheduled on:  08/21/13  100pm-245pm  Report to [facility].  Follow the Signs to [facility] at     1030   am  Call this number if you have problems the morning of surgery: [phone]   Remember:   Do not eat food after midnite.                May have clear liquids until 0600am then npo.    Take these medicines the morning of surgery with A SIP OF WATER:    Do not wear jewelry, make-up or nail polish.  Do not wear lotions, powders, or perfumes. ,deodorant   Do not shave 48 hours prior to surgery.   Do not bring valuables to the hospital.  Contacts, dentures or bridgework may not be worn into surgery.  Leave suitcase in the car. After surgery it may be brought to your room.  For patients admitted to the hospital, checkout time is 11:00 AM the day of  discharge.   [facility] - Preparing for Surgery Before surgery, you can play an important role.  Because skin is not sterile, your skin needs to be as free of germs as possible.  You can reduce the number of germs on your skin by washing with CHG (chlorahexidine gluconate) soap before surgery.  CHG is an antiseptic cleaner which kills germs and bonds with the skin to continue killing germs even after washing. Please DO NOT use if you have an allergy to CHG or antibacterial soaps.  If your skin becomes reddened/irritated stop using the CHG and inform your nurse when you arrive at Short Stay. Do not shave (including legs and underarms) for at least 48 hours prior to the first CHG shower.  You may shave your face/neck. Please follow these instructions carefully:  1.  Shower with CHG Soap the night before surgery and the  morning of Surgery.  2.  If you choose to wash your hair, wash your hair first as usual with your  normal  shampoo.  3.  After you shampoo, rinse your hair and body thoroughly to remove the  shampoo.                           4.  Use CHG as you would any other  liquid soap.  You can apply chg directly  to the skin and wash                       Gently with a scrungie or clean washcloth.  5.  Apply the CHG Soap to your body ONLY FROM THE NECK DOWN.   Do not use on face/ open                           Wound or open sores. Avoid contact with eyes, ears mouth and genitals (private parts).                       Wash face,  Genitals (private parts) with your normal soap.             6.  Wash thoroughly, paying special attention to the area where your surgery  will be performed.  7.  Thoroughly rinse your body with warm water from the neck down.  8.  DO NOT shower/wash with  your normal soap after using and rinsing off  the CHG Soap.                9.  Pat yourself dry with a clean towel.            10.  Wear clean pajamas.            11.  Place clean sheets on your bed the night of your first shower and do not  sleep with pets. Day of Surgery : Do not apply any lotions/deodorants the morning of surgery.  Please wear clean clothes to the hospital/surgery center.  FAILURE TO FOLLOW THESE INSTRUCTIONS MAY RESULT IN THE CANCELLATION OF YOUR SURGERY PATIENT SIGNATURE_________________________________  NURSE SIGNATURE__________________________________  ________________________________________________________________________   Adam Phenix  An incentive spirometer is a tool that can help keep your lungs clear and active. This tool measures how well you are filling your lungs with each breath. Taking long deep breaths may help reverse or decrease the chance of developing breathing (pulmonary) problems (especially infection) following:  A long period of time when you are unable to move or be active. BEFORE THE PROCEDURE   If the spirometer includes an indicator to show your best effort, your nurse or respiratory therapist will set it to a desired goal.  If possible, sit up straight or lean slightly forward. Try not to slouch.  Hold the incentive  spirometer in an upright position. INSTRUCTIONS FOR USE  1. Sit on the edge of your bed if possible, or sit up as far as you can in bed or on a chair. 2. Hold the incentive spirometer in an upright position. 3. Breathe out normally. 4. Place the mouthpiece in your mouth and seal your lips tightly around it. 5. Breathe in slowly and as deeply as possible, raising the piston or the ball toward the top of the column. 6. Hold your breath for 3-5 seconds or for as long as possible. Allow the piston or ball to fall to the bottom of the column. 7. Remove the mouthpiece from your mouth and breathe out normally. 8. Rest for a few seconds and repeat Steps 1 through 7 at least 10 times every 1-2 hours when you are awake. Take your time and take a few normal breaths between deep breaths. 9. The spirometer may include an indicator to show your best effort. Use the indicator as a goal to work toward during each repetition. 10. After each set of 10 deep breaths, practice coughing to be sure your lungs are clear. If you have an incision (the cut made at the time of surgery), support your incision when coughing by placing a pillow or rolled up towels firmly against it. Once you are able to get out of bed, walk around indoors and cough well. You may stop using the incentive spirometer when instructed by your caregiver.  RISKS AND COMPLICATIONS  Take your time so you do not get dizzy or light-headed.  If you are in pain, you may need to take or ask for pain medication before doing incentive spirometry. It is harder to take a deep breath if you are having pain. AFTER USE  Rest and breathe slowly and easily.  It can be helpful to keep track of a log of your progress. Your caregiver can provide you with a simple table to help with this. If you are using the spirometer at home, follow these instructions: Walker IF:   You are having difficultly using the spirometer.  You have trouble using the  spirometer as often as instructed.  Your pain medication is not giving enough relief while using the spirometer.  You develop fever of 100.5 F (38.1 C) or higher. SEEK IMMEDIATE MEDICAL CARE IF:   You cough up bloody sputum that had not been present before.  You develop fever of 102 F (38.9 C) or greater.  You develop worsening pain at or near the incision site. MAKE SURE YOU:   Understand these instructions.  Will watch your condition.  Will get help right away if you are not doing well or get worse. Document Released: 05/16/2006 Document Revised: 03/28/2011 Document Reviewed: 07/17/2006 ExitCare Patient Information 2014 ExitCare, [place].   ________________________________________________________________________  WHAT IS A BLOOD TRANSFUSION? Blood Transfusion Information  A transfusion is the replacement of blood or some of its parts. Blood is made up of multiple cells which provide different functions.  Red blood cells carry oxygen and are used for blood loss replacement.  White blood cells fight against infection.  Platelets control bleeding.  Plasma helps clot blood.  Other blood products are available for specialized needs, such as hemophilia or other clotting disorders. BEFORE THE TRANSFUSION  Who gives blood for transfusions?   Healthy volunteers who are fully evaluated to make sure their blood is safe. This is blood bank blood. Transfusion therapy is the safest it has ever been in the practice of medicine. Before blood is taken from a donor, a complete history is taken to make sure that person has no history of diseases nor engages in risky social behavior (examples are intravenous drug use or sexual activity with multiple partners). The donor's travel history is screened to minimize risk of transmitting infections, such as malaria. The donated blood is tested for signs of infectious diseases, such as HIV and hepatitis. The blood is then tested to be sure it is  compatible with you in order to minimize the chance of a transfusion reaction. If you or a relative donates blood, this is often done in anticipation of surgery and is not appropriate for emergency situations. It takes many days to process the donated blood. RISKS AND COMPLICATIONS Although transfusion therapy is very safe and saves many lives, the main dangers of transfusion include:   Getting an infectious disease.  Developing a transfusion reaction. This is an allergic reaction to something in the blood you were given. Every precaution is taken to prevent this. The decision to have a blood transfusion has been considered carefully by your caregiver before blood is given. Blood is not given unless the benefits outweigh the risks. AFTER THE TRANSFUSION  Right after receiving a blood transfusion, you will usually feel much better and more energetic. This is especially true if your red blood cells have gotten low (anemic). The transfusion raises the level of the red blood cells which carry oxygen, and this usually causes an energy increase.  The nurse administering the transfusion will monitor you carefully for complications. HOME CARE INSTRUCTIONS  No special instructions are needed after a transfusion. You may find your energy is better. Speak with your caregiver about any limitations on activity for underlying diseases you may have. SEEK MEDICAL CARE IF:   Your condition is not improving after your transfusion.  You develop redness or irritation at the intravenous (IV) site. SEEK IMMEDIATE MEDICAL CARE IF:  Any of the following symptoms occur over the next 12 hours:  Shaking chills.  You have a temperature by mouth above 102 F (38.9 C), not  controlled by medicine.  Chest, back, or muscle pain.  People around you feel you are not acting correctly or are confused.  Shortness of breath or difficulty breathing.  Dizziness and fainting.  You get a rash or develop hives.  You have  a decrease in urine output.  Your urine turns a dark color or changes to pink, red, or brown. Any of the following symptoms occur over the next 10 days:  You have a temperature by mouth above 102 F (38.9 C), not controlled by medicine.  Shortness of breath.  Weakness after normal activity.  The white part of the eye turns yellow (jaundice).  You have a decrease in the amount of urine or are urinating less often.  Your urine turns a dark color or changes to pink, red, or brown. Document Released: 01/01/2000 Document Revised: 03/28/2011 Document Reviewed: 08/20/2007 ExitCare Patient Information 2014 ExitCare, [place].  _______________________________________________________________________   Please read over the following fact sheets that you were given: MRSA Information, coughing and deep breathing exercises, leg exercises               CLEAR LIQUID DIET   Foods Allowed                                                                     Foods Excluded  Coffee and tea, regular and decaf                             liquids that you cannot  Plain Jell-O in any flavor                                             see through such as: Fruit ices (not with fruit pulp)                                     milk, soups, orange juice  Iced Popsicles                                    All solid food Carbonated beverages, regular and diet                                    Cranberry, grape and apple juices Sports drinks like Gatorade Lightly seasoned clear broth or consume(fat free) Sugar, honey syrup  Sample Menu Breakfast                                Lunch                                     Supper Cranberry juice  Beef broth                            Chicken broth Jell-O                                     Grape juice                           Apple juice Coffee or tea                        Jell-O                                      Popsicle                                                 Coffee or tea                        Coffee or tea  _____________________________________________________________________

## 2013-08-14 ENCOUNTER — Encounter (HOSPITAL_COMMUNITY)
Admission: RE | Admit: 2013-08-14 | Discharge: 2013-08-14 | Disposition: A | Discharge status: Home / Self Care | Payer: Medicare Other | Source: Ambulatory Visit | Attending: Orthopedic Surgery | Admitting: Orthopedic Surgery

## 2013-08-14 ENCOUNTER — Encounter (HOSPITAL_COMMUNITY): Payer: Self-pay

## 2013-08-14 DIAGNOSIS — Z01812 Encounter for preprocedural laboratory examination: Secondary | ICD-10-CM | POA: Insufficient documentation

## 2013-08-14 DIAGNOSIS — Z01818 Encounter for other preprocedural examination: Secondary | ICD-10-CM | POA: Insufficient documentation

## 2013-08-14 HISTORY — DX: Pneumonia, unspecified organism: J18.9

## 2013-08-14 HISTORY — DX: Malignant (primary) neoplasm, unspecified: C80.1

## 2013-08-14 HISTORY — DX: Gastro-esophageal reflux disease without esophagitis: K21.9

## 2013-08-14 HISTORY — DX: Other specified postprocedural states: Z98.890

## 2013-08-14 HISTORY — DX: Anxiety disorder, unspecified: F41.9

## 2013-08-14 HISTORY — DX: Nausea with vomiting, unspecified: R11.2

## 2013-08-14 LAB — COMPREHENSIVE METABOLIC PANEL
ALT: 58 U/L — AB (ref 0–35)
AST: 49 U/L — AB (ref 0–37)
Albumin: 4.5 g/dL (ref 3.5–5.2)
Alkaline Phosphatase: 80 U/L (ref 39–117)
Anion gap: 15 (ref 5–15)
BUN: 14 mg/dL (ref 6–23)
CALCIUM: 10.1 mg/dL (ref 8.4–10.5)
CO2: 25 mEq/L (ref 19–32)
Chloride: 97 mEq/L (ref 96–112)
Creatinine, Ser: 0.75 mg/dL (ref 0.50–1.10)
GFR calc Af Amer: >90 mL/min (ref >90)
GFR calc non Af Amer: 87 mL/min — ABNORMAL LOW (ref >90)
Glucose, Bld: 106 mg/dL — ABNORMAL HIGH (ref 70–99)
Potassium: 4.3 mEq/L (ref 3.7–5.3)
SODIUM: 137 meq/L (ref 137–147)
TOTAL PROTEIN: 8.1 g/dL (ref 6.0–8.3)
Total Bilirubin: 0.8 mg/dL (ref 0.3–1.2)

## 2013-08-14 LAB — CBC
HCT: 43.4 % (ref 36.0–46.0)
Hemoglobin: 15.1 g/dL — ABNORMAL HIGH (ref 12.0–15.0)
MCH: 30.1 pg (ref 26.0–34.0)
MCHC: 34.8 g/dL (ref 30.0–36.0)
MCV: 86.6 fL (ref 78.0–100.0)
Platelets: 215 10*3/uL (ref 150–400)
RBC: 5.01 MIL/uL (ref 3.87–5.11)
RDW: 12.6 % (ref 11.5–15.5)
WBC: 5.8 10*3/uL (ref 4.0–10.5)

## 2013-08-14 LAB — URINALYSIS, ROUTINE W REFLEX MICROSCOPIC
Bilirubin Urine: NEGATIVE
Glucose, UA: NEGATIVE mg/dL
Hgb urine dipstick: NEGATIVE
KETONES UR: NEGATIVE mg/dL
Leukocytes, UA: NEGATIVE
NITRITE: NEGATIVE
Protein, ur: NEGATIVE mg/dL
SPECIFIC GRAVITY, URINE: 1.008 (ref 1.005–1.030)
UROBILINOGEN UA: 0.2 mg/dL (ref 0.0–1.0)
pH: 7 (ref 5.0–8.0)

## 2013-08-14 LAB — PROTIME-INR
INR: 1.05 (ref 0.00–1.49)
PROTHROMBIN TIME: 13.7 s (ref 11.6–15.2)

## 2013-08-14 LAB — APTT: aPTT: 38 seconds — ABNORMAL HIGH (ref 24–37)

## 2013-08-14 LAB — SURGICAL PCR SCREEN
MRSA, PCR: NEGATIVE
STAPHYLOCOCCUS AUREUS: NEGATIVE

## 2013-08-14 NOTE — Progress Notes (Signed)
EKG and  CXR 12/2012 in EPIC

## 2013-08-14 NOTE — Progress Notes (Signed)
Clearance Dr Melvyn Novas 05/10/13 on chart  EKG and CRX 12/2012 in [facility]

## 2013-08-16 NOTE — Progress Notes (Signed)
Left message with Orson Slick regarding nickel allergy and consent orders for surgery not in EPIC.

## 2013-08-18 ENCOUNTER — Other Ambulatory Visit: Payer: Self-pay | Admitting: Orthopedic Surgery

## 2013-08-18 NOTE — H&P (Signed)
[name] DOB: 1950-06-15 Single / Language: Cleophus Molt / Race: White Female Date of Admission:  08-21-2013 Chief Complaint:  Right Hip Pain History of Present Illness The patient is a 63 year old female who comes in for a preoperative History and Physical. The patient is scheduled for a right total hip arthroplasty (anterior approach) to be performed by Dr. Dione Plover. Aluisio, MD at [facility] on 08/21/2013. The patient is a 63 year old female who presents  for follow up of their hip. The patient is being followed for their right hip pain and osteoarthritis. They are 1 year(s) (and 10 months) out from the last office visit. Symptoms reported today include: pain (couple of months ago woke up and could not weight bear.) and pain at night (sometimes). The patient feels that they are doing poorly and report their pain level to be moderate. Current treatment includes: activity modification. The following medication has been used for pain control: Aleve as needed as well as Tiger balm patches. She has really been limited in her ability to do things she desires. She no longer can get on a horse and ride a horse. She has a hard time with regular every day activities. Pain is definitely worse than it was a few years ago. They have been treated conservatively in the past for the above stated problem and despite conservative measures, they continue to have progressive pain and severe functional limitations and dysfunction. They have failed non-operative management including home exercise, medications. It is felt that they would benefit from undergoing total joint replacement. Risks and benefits of the procedure have been discussed with the patient and they elect to proceed with surgery. There are no active contraindications to surgery such as ongoing infection or rapidly progressive neurological disease.  Allergies  Penicillin VK *PENICILLINS* Rash. Childhood RXN Nickel Jewelry - skin breaks  out  Problem List/Past Medical  Osteoarthritis of right hip (715.95  M16.11) Cardiac Arrhythmia  Gastroesophageal Reflux Disease Skin Cancer High blood pressure Hypercholesterolemia Osteoarthritis Anxiety Disorder Depression Pneumonia Hypertension Hypercholesterolemia Hypothyroidism Eczema Menopause Basal Cell Skin Cancer  Family History  Drug / Alcohol Addiction father Hypertension father and brother Severe allergy mother Diabetes Mellitus father and brother brother Cancer First Degree Relatives. mother and brother Cerebrovascular Accident father  Social History Living situation live alone Marital status single Number of flights of stairs before winded 2-3 Illicit drug use no Drug/Alcohol Rehab (Currently) no Drug/Alcohol Rehab (Previously) no Exercise Exercises weekly; does running / walking and gym / weights Exercises daily; does running / walking, other and gym / weights Pain Contract no Tobacco / smoke exposure no Tobacco use Former smoker. former smoker; smoke(d) 1/2 pack(s) per day; uses less than half 1/2 can(s) smokeless per week former smoker; smoke(d) 3/4 pack(s) per day Alcohol use current drinker; drinks wine; 5-7 per week Children 0 Current work status retired  Medication History Wellbutrin (100MG  Tablet, Oral three times a week) Active. Levoxyl (150MCG Tablet, Oral three times a week) Active. Aspirin EC (81MG  Tablet DR, Oral) Active. Multivitamins (Oral) Active. Fish Oil Burp-Less (Oral) Specific dose unknown - Active. Triamterene-HCTZ (37.5-25MG  Tablet, Oral daily) Active.  Past Surgical History Tonsillectomy   Review of Systems  General Not Present- Chills, Fatigue, Fever, Memory Loss, Night Sweats, Weight Gain and Weight Loss. Skin Not Present- Eczema, Hives, Itching, Lesions and Rash. HEENT Present- Double Vision. Not Present- Dentures, Headache, Hearing Loss, Tinnitus and Visual Loss. Respiratory  Not Present- Allergies, Chronic Cough, Coughing up blood, Shortness of breath  at rest and Shortness of breath with exertion. Cardiovascular Not Present- Chest Pain, Difficulty Breathing Lying Down, Murmur, Palpitations, Racing/skipping heartbeats and Swelling. Gastrointestinal Present- Heartburn. Not Present- Abdominal Pain, Bloody Stool, Constipation, Diarrhea, Difficulty Swallowing, Jaundice, Loss of appetitie, Nausea and Vomiting. Female Genitourinary Not Present- Blood in Urine, Discharge, Flank Pain, Incontinence, Painful Urination, Urgency, Urinary frequency, Urinary Retention, Urinating at Night and Weak urinary stream. Musculoskeletal Present- Joint Pain and Morning Stiffness. Not Present- Back Pain, Joint Swelling, Muscle Pain, Muscle Weakness and Spasms. Neurological Not Present- Blackout spells, Difficulty with balance, Dizziness, Paralysis, Tremor and Weakness. Psychiatric Present- Insomnia.   Vitals  BP: 128/76 (Sitting, Right Arm, Standard)    Physical Exam  General Mental Status -Alert, cooperative and good historian. General Appearance-pleasant, Not in acute distress. Orientation-Oriented X3. Build & Nutrition-Well nourished and Well developed.  Head and Neck Head-normocephalic, atraumatic . Neck Global Assessment - supple, no bruit auscultated on the right, no bruit auscultated on the left.  Eye Pupil - Bilateral-Regular and Round. Motion - Bilateral-EOMI.  Chest and Lung Exam Auscultation Breath sounds - clear at anterior chest wall and clear at posterior chest wall. Adventitious sounds - No Adventitious sounds.  Cardiovascular Auscultation Rhythm - Regular rate and rhythm. Heart Sounds - S1 WNL and S2 WNL. Murmurs & Other Heart Sounds - Auscultation of the heart reveals - No Murmurs.  Abdomen Palpation/Percussion Tenderness - Abdomen is non-tender to palpation. Rigidity (guarding) - Abdomen is soft. Auscultation Auscultation of the abdomen  reveals - Bowel sounds normal.  Female Genitourinary Note: Not done, not pertinent to present illness   Musculoskeletal Note: On exam, she is alert and oriented in no apparent distress. Her right hip can be flexed 90. Minimal internal rotation, about 20 external rotation and 20 abduction. She has pain on range of motion. Left hip has normal motion.  RADIOGRAPHS: AP pelvis and lateral of the right hip show end stage arthritic change of that hip bone on bone throughout with subchondral cystic formation.   Assessment & Plan  Osteoarthritis of right hip (715.95  M16.11) Impression: Right Hip Note:Plan is for a Right Total Hip Replacement - Anterior Approach by Dr. Wynelle Link.  Plan is to go to [facility] for therapy.  PCP - Dr. Melvyn Novas - Patient has been seen preoperatively and felt to be stable for surgery.  The patient does not have any contraindications and will receive TXA (tranexamic acid) prior to surgery.  Signed electronically by Ok Edwards, III PA-C

## 2013-08-19 NOTE — Progress Notes (Signed)
Consent order not in EPIC. Please place order for consent in EPIC.  Surgery on 08/21/2013.

## 2013-08-21 ENCOUNTER — Inpatient Hospital Stay (HOSPITAL_COMMUNITY)
Admission: RE | Admit: 2013-08-21 | Discharge: 2013-08-24 | DRG: 470 | Disposition: A | Discharge status: Home Health | Payer: Medicare Other | Source: Ambulatory Visit | Attending: Orthopedic Surgery | Admitting: Orthopedic Surgery

## 2013-08-21 ENCOUNTER — Encounter (HOSPITAL_COMMUNITY): Payer: Medicare Other | Admitting: Anesthesiology

## 2013-08-21 ENCOUNTER — Encounter (HOSPITAL_COMMUNITY)
Admission: RE | Disposition: A | Discharge status: Home Health | Payer: Self-pay | Source: Ambulatory Visit | Attending: Orthopedic Surgery

## 2013-08-21 ENCOUNTER — Inpatient Hospital Stay (HOSPITAL_COMMUNITY): Payer: Medicare Other

## 2013-08-21 ENCOUNTER — Encounter (HOSPITAL_COMMUNITY): Payer: Self-pay

## 2013-08-21 ENCOUNTER — Inpatient Hospital Stay (HOSPITAL_COMMUNITY): Payer: Medicare Other | Admitting: Anesthesiology

## 2013-08-21 DIAGNOSIS — E871 Hypo-osmolality and hyponatremia: Secondary | ICD-10-CM

## 2013-08-21 DIAGNOSIS — E039 Hypothyroidism, unspecified: Secondary | ICD-10-CM | POA: Diagnosis present

## 2013-08-21 DIAGNOSIS — Z6827 Body mass index (BMI) 27.0-27.9, adult: Secondary | ICD-10-CM | POA: Diagnosis not present

## 2013-08-21 DIAGNOSIS — D62 Acute posthemorrhagic anemia: Secondary | ICD-10-CM | POA: Diagnosis not present

## 2013-08-21 DIAGNOSIS — I10 Essential (primary) hypertension: Secondary | ICD-10-CM | POA: Diagnosis present

## 2013-08-21 DIAGNOSIS — K219 Gastro-esophageal reflux disease without esophagitis: Secondary | ICD-10-CM | POA: Diagnosis present

## 2013-08-21 DIAGNOSIS — Z7982 Long term (current) use of aspirin: Secondary | ICD-10-CM

## 2013-08-21 DIAGNOSIS — M1610 Unilateral primary osteoarthritis, unspecified hip: Principal | ICD-10-CM | POA: Diagnosis present

## 2013-08-21 DIAGNOSIS — Z8249 Family history of ischemic heart disease and other diseases of the circulatory system: Secondary | ICD-10-CM | POA: Diagnosis not present

## 2013-08-21 DIAGNOSIS — F411 Generalized anxiety disorder: Secondary | ICD-10-CM | POA: Diagnosis present

## 2013-08-21 DIAGNOSIS — Z96641 Presence of right artificial hip joint: Secondary | ICD-10-CM

## 2013-08-21 DIAGNOSIS — M169 Osteoarthritis of hip, unspecified: Secondary | ICD-10-CM | POA: Diagnosis not present

## 2013-08-21 DIAGNOSIS — M1611 Unilateral primary osteoarthritis, right hip: Secondary | ICD-10-CM

## 2013-08-21 DIAGNOSIS — Z87891 Personal history of nicotine dependence: Secondary | ICD-10-CM | POA: Diagnosis not present

## 2013-08-21 DIAGNOSIS — M949 Disorder of cartilage, unspecified: Secondary | ICD-10-CM

## 2013-08-21 DIAGNOSIS — E785 Hyperlipidemia, unspecified: Secondary | ICD-10-CM | POA: Diagnosis present

## 2013-08-21 DIAGNOSIS — M899 Disorder of bone, unspecified: Secondary | ICD-10-CM | POA: Diagnosis present

## 2013-08-21 DIAGNOSIS — M25559 Pain in unspecified hip: Secondary | ICD-10-CM | POA: Diagnosis present

## 2013-08-21 HISTORY — PX: TOTAL HIP ARTHROPLASTY: SHX124

## 2013-08-21 LAB — TYPE AND SCREEN
ABO/RH(D): O POS
Antibody Screen: NEGATIVE

## 2013-08-21 LAB — ABO/RH: ABO/RH(D): O POS

## 2013-08-21 SURGERY — ARTHROPLASTY, HIP, TOTAL, ANTERIOR APPROACH
Anesthesia: Spinal | Site: Hip | Laterality: Right

## 2013-08-21 MED ORDER — 0.9 % SODIUM CHLORIDE (POUR BTL) OPTIME
TOPICAL | Status: DC | PRN
  Administered 2013-08-21: 1000 mL

## 2013-08-21 MED ORDER — DOCUSATE SODIUM 100 MG PO CAPS
100.0000 mg | ORAL_CAPSULE | Freq: Two times a day (BID) | ORAL | Status: DC
  Administered 2013-08-21 – 2013-08-24 (×6): 100 mg via ORAL

## 2013-08-21 MED ORDER — DIPHENHYDRAMINE HCL 12.5 MG/5ML PO ELIX: 12.5000 mg | ORAL_SOLUTION | ORAL | Status: DC | PRN

## 2013-08-21 MED ORDER — PHENOL 1.4 % MT LIQD: 1.0000 | OROMUCOSAL | Status: DC | PRN

## 2013-08-21 MED ORDER — BUPIVACAINE LIPOSOME 1.3 % IJ SUSP
20.0000 mL | Freq: Once | INTRAMUSCULAR | Status: DC
  Filled 2013-08-21: qty 20

## 2013-08-21 MED ORDER — DEXAMETHASONE 4 MG PO TABS
10.0000 mg | ORAL_TABLET | Freq: Every day | ORAL | Status: AC
  Administered 2013-08-22: 10 mg via ORAL
  Filled 2013-08-21: qty 1

## 2013-08-21 MED ORDER — CHLORHEXIDINE GLUCONATE 4 % EX LIQD: 60.0000 mL | Freq: Once | CUTANEOUS | Status: DC

## 2013-08-21 MED ORDER — ONDANSETRON HCL 4 MG/2ML IJ SOLN: 4.0000 mg | Freq: Four times a day (QID) | INTRAMUSCULAR | Status: DC | PRN

## 2013-08-21 MED ORDER — MIDAZOLAM HCL 5 MG/5ML IJ SOLN
INTRAMUSCULAR | Status: DC | PRN
  Administered 2013-08-21: 2 mg via INTRAVENOUS

## 2013-08-21 MED ORDER — ONDANSETRON HCL 4 MG/2ML IJ SOLN
INTRAMUSCULAR | Status: DC | PRN
  Administered 2013-08-21: 4 mg via INTRAVENOUS

## 2013-08-21 MED ORDER — LACTATED RINGERS IV SOLN
INTRAVENOUS | Status: DC | PRN
  Administered 2013-08-21 (×3): via INTRAVENOUS

## 2013-08-21 MED ORDER — ACETAMINOPHEN 10 MG/ML IV SOLN
1000.0000 mg | Freq: Once | INTRAVENOUS | Status: AC
  Administered 2013-08-21: 1000 mg via INTRAVENOUS
  Filled 2013-08-21: qty 100

## 2013-08-21 MED ORDER — DEXAMETHASONE SODIUM PHOSPHATE 10 MG/ML IJ SOLN
INTRAMUSCULAR | Status: AC
  Filled 2013-08-21: qty 1

## 2013-08-21 MED ORDER — BUPIVACAINE LIPOSOME 1.3 % IJ SUSP
INTRAMUSCULAR | Status: DC | PRN
  Administered 2013-08-21: 20 mL

## 2013-08-21 MED ORDER — TRAMADOL HCL 50 MG PO TABS: 50.0000 mg | ORAL_TABLET | Freq: Four times a day (QID) | ORAL | Status: DC | PRN

## 2013-08-21 MED ORDER — VANCOMYCIN HCL IN DEXTROSE 1-5 GM/200ML-% IV SOLN
1000.0000 mg | Freq: Once | INTRAVENOUS | Status: AC
  Administered 2013-08-22: 1000 mg via INTRAVENOUS
  Filled 2013-08-21: qty 200

## 2013-08-21 MED ORDER — PROPOFOL 10 MG/ML IV BOLUS
INTRAVENOUS | Status: AC
  Filled 2013-08-21: qty 20

## 2013-08-21 MED ORDER — PROMETHAZINE HCL 25 MG/ML IJ SOLN: 6.2500 mg | INTRAMUSCULAR | Status: DC | PRN

## 2013-08-21 MED ORDER — RIVAROXABAN 10 MG PO TABS
10.0000 mg | ORAL_TABLET | Freq: Every day | ORAL | Status: DC
  Administered 2013-08-22 – 2013-08-24 (×3): 10 mg via ORAL
  Filled 2013-08-21 (×4): qty 1

## 2013-08-21 MED ORDER — METHOCARBAMOL 500 MG PO TABS
500.0000 mg | ORAL_TABLET | Freq: Four times a day (QID) | ORAL | Status: DC | PRN
  Administered 2013-08-22 – 2013-08-24 (×5): 500 mg via ORAL
  Filled 2013-08-21 (×6): qty 1

## 2013-08-21 MED ORDER — KETOROLAC TROMETHAMINE 15 MG/ML IJ SOLN
7.5000 mg | Freq: Four times a day (QID) | INTRAMUSCULAR | Status: AC | PRN
  Administered 2013-08-21: 7.5 mg via INTRAVENOUS

## 2013-08-21 MED ORDER — SODIUM CHLORIDE 0.9 % IJ SOLN
INTRAMUSCULAR | Status: AC
  Filled 2013-08-21: qty 50

## 2013-08-21 MED ORDER — ACETAMINOPHEN 650 MG RE SUPP: 650.0000 mg | Freq: Four times a day (QID) | RECTAL | Status: DC | PRN

## 2013-08-21 MED ORDER — DEXAMETHASONE SODIUM PHOSPHATE 10 MG/ML IJ SOLN
10.0000 mg | Freq: Every day | INTRAMUSCULAR | Status: AC
  Filled 2013-08-21: qty 1

## 2013-08-21 MED ORDER — POLYVINYL ALCOHOL 1.4 % OP SOLN
1.0000 [drp] | Freq: Three times a day (TID) | OPHTHALMIC | Status: DC | PRN
  Administered 2013-08-22: 1 [drp] via OPHTHALMIC
  Filled 2013-08-21: qty 15

## 2013-08-21 MED ORDER — PROPOFOL INFUSION 10 MG/ML OPTIME
INTRAVENOUS | Status: DC | PRN
  Administered 2013-08-21: 100 ug/kg/min via INTRAVENOUS

## 2013-08-21 MED ORDER — BUPROPION HCL ER (SR) 150 MG PO TB12
150.0000 mg | ORAL_TABLET | ORAL | Status: DC
  Administered 2013-08-22: 150 mg via ORAL
  Filled 2013-08-21: qty 1

## 2013-08-21 MED ORDER — BUPIVACAINE HCL (PF) 0.25 % IJ SOLN
INTRAMUSCULAR | Status: AC
  Filled 2013-08-21: qty 30

## 2013-08-21 MED ORDER — KETOROLAC TROMETHAMINE 15 MG/ML IJ SOLN
INTRAMUSCULAR | Status: AC
  Filled 2013-08-21: qty 1

## 2013-08-21 MED ORDER — OXYCODONE HCL 5 MG PO TABS
5.0000 mg | ORAL_TABLET | ORAL | Status: DC | PRN
  Administered 2013-08-21: 5 mg via ORAL
  Administered 2013-08-21 – 2013-08-23 (×9): 10 mg via ORAL
  Administered 2013-08-24 (×3): 5 mg via ORAL
  Filled 2013-08-21: qty 2
  Filled 2013-08-21: qty 1
  Filled 2013-08-21 (×4): qty 2
  Filled 2013-08-21: qty 1
  Filled 2013-08-21: qty 2
  Filled 2013-08-21: qty 1
  Filled 2013-08-21 (×2): qty 2
  Filled 2013-08-21: qty 1
  Filled 2013-08-21: qty 2

## 2013-08-21 MED ORDER — TRANEXAMIC ACID 100 MG/ML IV SOLN
1000.0000 mg | INTRAVENOUS | Status: AC
  Administered 2013-08-21: 1000 mg via INTRAVENOUS
  Filled 2013-08-21: qty 10

## 2013-08-21 MED ORDER — METHOCARBAMOL 1000 MG/10ML IJ SOLN
500.0000 mg | Freq: Four times a day (QID) | INTRAMUSCULAR | Status: DC | PRN
  Administered 2013-08-21: 500 mg via INTRAVENOUS
  Filled 2013-08-21: qty 5

## 2013-08-21 MED ORDER — MIDAZOLAM HCL 2 MG/2ML IJ SOLN
INTRAMUSCULAR | Status: AC
  Filled 2013-08-21: qty 2

## 2013-08-21 MED ORDER — SODIUM CHLORIDE 0.9 % IV SOLN
INTRAVENOUS | Status: DC
  Administered 2013-08-21 – 2013-08-22 (×3): via INTRAVENOUS

## 2013-08-21 MED ORDER — FAMOTIDINE 20 MG PO TABS
20.0000 mg | ORAL_TABLET | Freq: Every day | ORAL | Status: DC
  Administered 2013-08-21 – 2013-08-24 (×4): 20 mg via ORAL
  Filled 2013-08-21 (×4): qty 1

## 2013-08-21 MED ORDER — FLEET ENEMA 7-19 GM/118ML RE ENEM: 1.0000 | ENEMA | Freq: Once | RECTAL | Status: AC | PRN

## 2013-08-21 MED ORDER — TRIAMTERENE-HCTZ 37.5-25 MG PO TABS
1.0000 | ORAL_TABLET | Freq: Every morning | ORAL | Status: DC
  Administered 2013-08-22 – 2013-08-24 (×3): 1 via ORAL
  Filled 2013-08-21 (×3): qty 1

## 2013-08-21 MED ORDER — ACETAMINOPHEN 325 MG PO TABS: 650.0000 mg | ORAL_TABLET | Freq: Four times a day (QID) | ORAL | Status: DC | PRN

## 2013-08-21 MED ORDER — ONDANSETRON HCL 4 MG PO TABS: 4.0000 mg | ORAL_TABLET | Freq: Four times a day (QID) | ORAL | Status: DC | PRN

## 2013-08-21 MED ORDER — SODIUM CHLORIDE 0.9 % IV SOLN: INTRAVENOUS | Status: DC

## 2013-08-21 MED ORDER — POLYETHYLENE GLYCOL 3350 17 G PO PACK
17.0000 g | PACK | Freq: Every day | ORAL | Status: DC | PRN
  Administered 2013-08-22: 17 g via ORAL

## 2013-08-21 MED ORDER — SODIUM CHLORIDE 0.9 % IJ SOLN
INTRAMUSCULAR | Status: DC | PRN
  Administered 2013-08-21: 30 mL via INTRAVENOUS

## 2013-08-21 MED ORDER — ONDANSETRON HCL 4 MG/2ML IJ SOLN
INTRAMUSCULAR | Status: AC
  Filled 2013-08-21: qty 2

## 2013-08-21 MED ORDER — LOTEPREDNOL ETABONATE 0.5 % OP SUSP
1.0000 [drp] | Freq: Two times a day (BID) | OPHTHALMIC | Status: DC | PRN
  Filled 2013-08-21: qty 5

## 2013-08-21 MED ORDER — DEXAMETHASONE SODIUM PHOSPHATE 10 MG/ML IJ SOLN
10.0000 mg | Freq: Once | INTRAMUSCULAR | Status: AC
  Administered 2013-08-21: 10 mg via INTRAVENOUS

## 2013-08-21 MED ORDER — VANCOMYCIN HCL IN DEXTROSE 1-5 GM/200ML-% IV SOLN
INTRAVENOUS | Status: AC
  Filled 2013-08-21: qty 200

## 2013-08-21 MED ORDER — BISACODYL 10 MG RE SUPP: 10.0000 mg | Freq: Every day | RECTAL | Status: DC | PRN

## 2013-08-21 MED ORDER — LACTATED RINGERS IV SOLN
INTRAVENOUS | Status: DC
  Administered 2013-08-21: 1000 mL via INTRAVENOUS

## 2013-08-21 MED ORDER — HYPROMELLOSE (GONIOSCOPIC) 2.5 % OP SOLN: 1.0000 [drp] | Freq: Three times a day (TID) | OPHTHALMIC | Status: DC | PRN

## 2013-08-21 MED ORDER — LEVOTHYROXINE SODIUM 25 MCG PO TABS
25.0000 ug | ORAL_TABLET | ORAL | Status: DC
  Administered 2013-08-23: 25 ug via ORAL
  Filled 2013-08-21 (×2): qty 1

## 2013-08-21 MED ORDER — HYDROMORPHONE HCL PF 1 MG/ML IJ SOLN: 0.2500 mg | INTRAMUSCULAR | Status: DC | PRN

## 2013-08-21 MED ORDER — LIDOCAINE HCL (CARDIAC) 20 MG/ML IV SOLN
INTRAVENOUS | Status: AC
  Filled 2013-08-21: qty 5

## 2013-08-21 MED ORDER — MORPHINE SULFATE 2 MG/ML IJ SOLN
1.0000 mg | INTRAMUSCULAR | Status: DC | PRN
Start: 2013-08-21 — End: 2013-08-24

## 2013-08-21 MED ORDER — BUPIVACAINE HCL (PF) 0.25 % IJ SOLN
INTRAMUSCULAR | Status: DC | PRN
  Administered 2013-08-21: 20 mL

## 2013-08-21 MED ORDER — ACETAMINOPHEN 500 MG PO TABS
1000.0000 mg | ORAL_TABLET | Freq: Four times a day (QID) | ORAL | Status: AC
  Administered 2013-08-21 – 2013-08-22 (×4): 1000 mg via ORAL
  Filled 2013-08-21 (×4): qty 2

## 2013-08-21 MED ORDER — METOCLOPRAMIDE HCL 10 MG PO TABS: 5.0000 mg | ORAL_TABLET | Freq: Three times a day (TID) | ORAL | Status: DC | PRN

## 2013-08-21 MED ORDER — MENTHOL 3 MG MT LOZG: 1.0000 | LOZENGE | OROMUCOSAL | Status: DC | PRN

## 2013-08-21 MED ORDER — VANCOMYCIN HCL IN DEXTROSE 1-5 GM/200ML-% IV SOLN
1000.0000 mg | INTRAVENOUS | Status: AC
  Administered 2013-08-21: 1000 mg via INTRAVENOUS

## 2013-08-21 MED ORDER — METOCLOPRAMIDE HCL 5 MG/ML IJ SOLN: 5.0000 mg | Freq: Three times a day (TID) | INTRAMUSCULAR | Status: DC | PRN

## 2013-08-21 MED ORDER — BUPIVACAINE IN DEXTROSE 0.75-8.25 % IT SOLN
INTRATHECAL | Status: DC | PRN
  Administered 2013-08-21: 1.8 mL via INTRATHECAL

## 2013-08-21 SURGICAL SUPPLY — 38 items
BAG ZIPLOCK 12X15 (MISCELLANEOUS) IMPLANT
BLADE EXTENDED COATED 6.5IN (ELECTRODE) ×2 IMPLANT
BLADE SAG 18X100X1.27 (BLADE) ×2 IMPLANT
CAPT HIP PF COP ×2 IMPLANT
COVER PERINEAL POST (MISCELLANEOUS) ×2 IMPLANT
DECANTER SPIKE VIAL GLASS SM (MISCELLANEOUS) ×2 IMPLANT
DRAPE C-ARM 42X120 X-RAY (DRAPES) ×2 IMPLANT
DRAPE STERI IOBAN 125X83 (DRAPES) ×2 IMPLANT
DRAPE U-SHAPE 47X51 STRL (DRAPES) ×6 IMPLANT
DRSG ADAPTIC 3X8 NADH LF (GAUZE/BANDAGES/DRESSINGS) ×2 IMPLANT
DRSG MEPILEX BORDER 4X4 (GAUZE/BANDAGES/DRESSINGS) ×2 IMPLANT
DRSG MEPILEX BORDER 4X8 (GAUZE/BANDAGES/DRESSINGS) ×2 IMPLANT
DURAPREP 26ML APPLICATOR (WOUND CARE) ×2 IMPLANT
ELECT REM PT RETURN 9FT ADLT (ELECTROSURGICAL) ×2
ELECTRODE REM PT RTRN 9FT ADLT (ELECTROSURGICAL) ×1 IMPLANT
EVACUATOR 1/8 PVC DRAIN (DRAIN) ×2 IMPLANT
FACESHIELD WRAPAROUND (MASK) ×8 IMPLANT
GAUZE SPONGE 4X4 12PLY STRL (GAUZE/BANDAGES/DRESSINGS) IMPLANT
GLOVE BIO SURGEON STRL SZ7.5 (GLOVE) ×2 IMPLANT
GLOVE BIO SURGEON STRL SZ8 (GLOVE) ×4 IMPLANT
GLOVE BIOGEL PI IND STRL 8 (GLOVE) ×2 IMPLANT
GLOVE BIOGEL PI INDICATOR 8 (GLOVE) ×2
GOWN STRL REUS W/TWL LRG LVL3 (GOWN DISPOSABLE) ×2 IMPLANT
GOWN STRL REUS W/TWL XL LVL3 (GOWN DISPOSABLE) ×2 IMPLANT
KIT BASIN OR (CUSTOM PROCEDURE TRAY) ×2 IMPLANT
NDL SAFETY ECLIPSE 18X1.5 (NEEDLE) ×2 IMPLANT
NEEDLE HYPO 18GX1.5 SHARP (NEEDLE) ×2
PACK TOTAL JOINT (CUSTOM PROCEDURE TRAY) ×2 IMPLANT
STRIP CLOSURE SKIN 1/2X4 (GAUZE/BANDAGES/DRESSINGS) ×2 IMPLANT
SUT ETHIBOND NAB CT1 #1 30IN (SUTURE) ×2 IMPLANT
SUT MNCRL AB 4-0 PS2 18 (SUTURE) ×2 IMPLANT
SUT VIC AB 2-0 CT1 27 (SUTURE) ×2
SUT VIC AB 2-0 CT1 TAPERPNT 27 (SUTURE) ×2 IMPLANT
SUT VLOC 180 0 24IN GS25 (SUTURE) ×2 IMPLANT
SYRINGE 20CC LL (MISCELLANEOUS) ×2 IMPLANT
SYRINGE 60CC LL (MISCELLANEOUS) ×2 IMPLANT
TOWEL OR 17X26 10 PK STRL BLUE (TOWEL DISPOSABLE) ×2 IMPLANT
TRAY FOLEY CATH 14FRSI W/METER (CATHETERS) ×2 IMPLANT

## 2013-08-21 NOTE — Interval H&P Note (Signed)
History and Physical Interval Note:  08/21/2013 12:44 PM  [name]  has presented today for surgery, with the diagnosis of OA RIGHT HIP  The various methods of treatment have been discussed with the patient and family. After consideration of risks, benefits and other options for treatment, the patient has consented to  Procedure(s): RIGHT TOTAL HIP ARTHROPLASTY ANTERIOR APPROACH (Right) as a surgical intervention .  The patient's history has been reviewed, patient examined, no change in status, stable for surgery.  I have reviewed the patient's chart and labs.  Questions were answered to the patient's satisfaction.     Gearlean Alf

## 2013-08-21 NOTE — Anesthesia Preprocedure Evaluation (Addendum)
Anesthesia Evaluation  Patient identified by MRN, date of birth, ID band Patient awake    Reviewed: Allergy & Precautions, H&P , NPO status , Patient's Chart, lab work & pertinent test results  Airway Mallampati: II TM Distance: >3 FB Neck ROM: Full    Dental no notable dental hx.    Pulmonary neg pulmonary ROS, former smoker,  breath sounds clear to auscultation  Pulmonary exam normal       Cardiovascular hypertension, Pt. on medications Rhythm:Regular Rate:Normal     Neuro/Psych negative neurological ROS  negative psych ROS   GI/Hepatic negative GI ROS, Neg liver ROS,   Endo/Other  Hypothyroidism   Renal/GU negative Renal ROS  negative genitourinary   Musculoskeletal negative musculoskeletal ROS (+)   Abdominal   Peds negative pediatric ROS (+)  Hematology negative hematology ROS (+)   Anesthesia Other Findings   Reproductive/Obstetrics negative OB ROS                          Anesthesia Physical Anesthesia Plan  ASA: II  Anesthesia Plan: Spinal   Post-op Pain Management:    Induction: Intravenous  Airway Management Planned: Simple Face Mask  Additional Equipment:   Intra-op Plan:   Post-operative Plan:   Informed Consent: I have reviewed the patients History and Physical, chart, labs and discussed the procedure including the risks, benefits and alternatives for the proposed anesthesia with the patient or authorized representative who has indicated his/her understanding and acceptance.   Dental advisory given  Plan Discussed with: CRNA and Surgeon  Anesthesia Plan Comments:         Anesthesia Quick Evaluation

## 2013-08-21 NOTE — Op Note (Signed)
OPERATIVE REPORT  PREOPERATIVE DIAGNOSIS: Osteoarthritis of the Right hip.   POSTOPERATIVE DIAGNOSIS: Osteoarthritis of the Right  hip.   PROCEDURE: Right total hip arthroplasty, anterior approach.   SURGEON: Gaynelle Arabian, MD   ASSISTANT: [name], PA-C  ANESTHESIA:  Spinal  ESTIMATED BLOOD LOSS:- 300 ml   DRAINS: Hemovac x1.   COMPLICATIONS: None   CONDITION: PACU - hemodynamically stable.   BRIEF CLINICAL NOTE: [name] is a 63 y.o. female who has advanced end-  stage arthritis of his Right  hip with progressively worsening pain and  dysfunction.The patient has failed nonoperative management and presents for  total hip arthroplasty.   PROCEDURE IN DETAIL: After successful administration of spinal  anesthetic, the traction boots for the [facility] bed were placed on both  feet and the patient was placed onto the [facility] bed, boots placed into the leg  holders. The Right hip was then isolated from the perineum with plastic  drapes and prepped and draped in the usual sterile fashion. ASIS and  greater trochanter were marked and a oblique incision was made, starting  at about 1 cm lateral and 2 cm distal to the ASIS and coursing towards  the anterior cortex of the femur. The skin was cut with a 10 blade  through subcutaneous tissue to the level of the fascia overlying the  tensor fascia lata muscle. The fascia was then incised in line with the  incision at the junction of the anterior third and posterior 2/3rd. The  muscle was teased off the fascia and then the interval between the TFL  and the rectus was developed. The Hohmann retractor was then placed at  the top of the femoral neck over the capsule. The vessels overlying the  capsule were cauterized and the fat on top of the capsule was removed.  A Hohmann retractor was then placed anterior underneath the rectus  femoris to give exposure to the entire anterior capsule. A T-shaped  capsulotomy was performed.  The edges were tagged and the femoral head  was identified.       Osteophytes are removed off the superior acetabulum.  The femoral neck was then cut in situ with an oscillating saw. Traction  was then applied to the left lower extremity utilizing the [facility]  traction. The femoral head was then removed. Retractors were placed  around the acetabulum and then circumferential removal of the labrum was  performed. Osteophytes were also removed. Reaming starts at 45 mm to  medialize and  Increased in 2 mm increments to 49 mm. We reamed in  approximately 40 degrees of abduction, 20 degrees anteversion. A 50 mm  pinnacle acetabular shell was then impacted in anatomic position under  fluoroscopic guidance with excellent purchase. We did not need to place  any additional dome screws. A 32 mm neytral + 4 marathon liner was then  placed into the acetabular shell.       The femoral lift was then placed along the lateral aspect of the femur  just distal to the vastus ridge. The leg was  externally rotated and capsule  was stripped off the inferior aspect of the femoral neck down to the  level of the lesser trochanter, this was done with electrocautery. The femur was lifted after this was performed. The  leg was then placed and extended in adducted position to essentially delivering the femur. We also removed the capsule superiorly and the  piriformis from the piriformis  fossa to gain excellent exposure of the  proximal femur. Rongeur was used to remove some cancellous bone to get  into the lateral portion of the proximal femur for placement of the  initial starter reamer. The starter broaches was placed  the starter broach  and was shown to go down the center of the canal. Broaching  with the  Corail system was then performed starting at size 8, coursing  Up to size 11. A size 11 had excellent torsional and rotational  and axial stability. The trial standard offset neck was then placed  with a 32 + 1  trial head. The hip was then reduced. We confirmed that  the stem was in the canal both on AP and lateral x-rays. It also has excellent sizing. The hip was reduced with outstanding stability through full extension, full external rotation,  and then flexion in adduction internal rotation. AP pelvis was taken  and the leg lengths were measured and found to be exactly equal. Hip  was then dislocated again and the femoral head and neck removed. The  femoral broach was removed. Size 11 Corail stem with a standard offset  neck was then impacted into the femur following native anteversion. Has  excellent purchase in the canal. Excellent torsional and rotational and  axial stability. It is confirmed to be in the canal on AP and lateral  fluoroscopic views. The 32 + 1 ceramic head was placed and the hip  reduced with outstanding stability. Again AP pelvis was taken and it  confirmed that the leg lengths were equal. The wound was then copiously  irrigated with saline solution and the capsule reattached and repaired  with Ethibond suture.  20 mL of Exparel mixed with 50 mL of saline then additional 20 ml of .25% Bupivicaine injected into the capsule and into the edge of the tensor fascia lata as well as subcutaneous tissue. The fascia overlying the tensor fascia lata was  then closed with a running #1 V-Loc. Subcu was closed with interrupted  2-0 Vicryl and subcuticular running 4-0 Monocryl. Incision was cleaned  and dried. Steri-Strips and a bulky sterile dressing applied. Hemovac  drain was hooked to suction and then he was awakened and transported to  recovery in stable condition.        Please note that a surgical assistant was a medical necessity for this procedure to perform it in a safe and expeditious manner. Assistant was necessary to provide appropriate retraction of vital neurovascular structures and to prevent femoral fracture and allow for anatomic placement of the prosthesis.  Gaynelle Arabian,  M.D.

## 2013-08-21 NOTE — Transfer of Care (Signed)
Immediate Anesthesia Transfer of Care Note  Patient: [name]  Procedure(s) Performed: Procedure(s): RIGHT TOTAL HIP ARTHROPLASTY ANTERIOR APPROACH (Right)  Patient Location: PACU  Anesthesia Type:Spinal  Level of Consciousness: sedated  Airway & Oxygen Therapy: Patient Spontanous Breathing and Patient connected to face mask oxygen  Post-op Assessment: Report given to PACU RN and Post -op Vital signs reviewed and stable  Post vital signs: Reviewed and stable  Complications: No apparent anesthesia complications

## 2013-08-21 NOTE — Anesthesia Postprocedure Evaluation (Signed)
  Anesthesia Post-op Note  Patient: [name]  Procedure(s) Performed: Procedure(s) (LRB): RIGHT TOTAL HIP ARTHROPLASTY ANTERIOR APPROACH (Right)  Patient Location: PACU  Anesthesia Type: Spinal  Level of Consciousness: awake and alert   Airway and Oxygen Therapy: Patient Spontanous Breathing  Post-op Pain: mild  Post-op Assessment: Post-op Vital signs reviewed, Patient's Cardiovascular Status Stable, Respiratory Function Stable, Patent Airway and No signs of Nausea or vomiting  Last Vitals:  Filed Vitals:   08/21/13 1521  BP: 115/57  Pulse: 72  Temp: 36.4 C  Resp: 15    Post-op Vital Signs: stable   Complications: No apparent anesthesia complications

## 2013-08-21 NOTE — Progress Notes (Signed)
Patient states she had a stomach virus this past Fri and Sat w nausea and diarrhea. Feels better today. Now constipated .

## 2013-08-21 NOTE — H&P (View-Only) (Signed)
[name] DOB: [birth year] Single / Language: Cleophus Molt / Race: White Female Date of Admission:  08-21-2013 Chief Complaint:  Right Hip Pain History of Present Illness The patient is a 63 year old female who comes in for a preoperative History and Physical. The patient is scheduled for a right total hip arthroplasty (anterior approach) to be performed by Dr. Dione Plover. Aluisio, MD at [facility] on 08/21/2013. The patient is a 63 year old female who presents  for follow up of their hip. The patient is being followed for their right hip pain and osteoarthritis. They are 1 year(s) (and 10 months) out from the last office visit. Symptoms reported today include: pain (couple of months ago woke up and could not weight bear.) and pain at night (sometimes). The patient feels that they are doing poorly and report their pain level to be moderate. Current treatment includes: activity modification. The following medication has been used for pain control: Aleve as needed as well as Tiger balm patches. She has really been limited in her ability to do things she desires. She no longer can get on a horse and ride a horse. She has a hard time with regular every day activities. Pain is definitely worse than it was a few years ago. They have been treated conservatively in the past for the above stated problem and despite conservative measures, they continue to have progressive pain and severe functional limitations and dysfunction. They have failed non-operative management including home exercise, medications. It is felt that they would benefit from undergoing total joint replacement. Risks and benefits of the procedure have been discussed with the patient and they elect to proceed with surgery. There are no active contraindications to surgery such as ongoing infection or rapidly progressive neurological disease.  Allergies  Penicillin VK *PENICILLINS* Rash. Childhood RXN [employer]el[employer] - skin breaks  out  Problem List/Past Medical  Osteoarthritis of right hip (715.95  M16.11) Cardiac Arrhythmia  Gastroesophageal Reflux Disease Skin Cancer High blood pressure Hypercholesterolemia Osteoarthritis Anxiety Disorder Depression Pneumonia Hypertension Hypercholesterolemia Hypothyroidism Eczema Menopause Basal Cell Skin Cancer  Family History  Drug / Alcohol Addiction father Hypertension father and brother Severe allergy mother Diabetes Mellitus father and brother brother Cancer First Degree Relatives. mother and brother Cerebrovascular Accident father  Social History Living situation live alone Marital status single Number of flights of stairs before winded 2-3 Illicit drug use no Drug/Alcohol Rehab (Currently) no Drug/Alcohol Rehab (Previously) no Exercise Exercises weekly; does running / walking and gym / weights Exercises daily; does running / walking, other and gym / weights Pain Contract no Tobacco / smoke exposure no Tobacco use Former smoker. former smoker; smoke(d) 1/2 pack(s) per day; uses less than half 1/2 can(s) smokeless per week former smoker; smoke(d) 3/4 pack(s) per day Alcohol use current drinker; drinks wine; 5-7 per week Children 0 Current work status retired  Medication History Wellbutrin (100MG  Tablet, Oral three times a week) Active. Levoxyl (150MCG Tablet, Oral three times a week) Active. Aspirin EC (81MG  Tablet DR, Oral) Active. Multivitamins (Oral) Active. Fish Oil Burp-Less (Oral) Specific dose unknown - Active. Triamterene-HCTZ (37.5-25MG  Tablet, Oral daily) Active.  Past Surgical History Tonsillectomy   Review of Systems  General Not Present- Chills, Fatigue, Fever, Memory Loss, Night Sweats, Weight Gain and Weight Loss. Skin Not Present- Eczema, Hives, Itching, Lesions and Rash. HEENT Present- Double Vision. Not Present- Dentures, Headache, Hearing Loss, Tinnitus and Visual Loss. Respiratory  Not Present- Allergies, Chronic Cough, Coughing up blood, Shortness of breath  at rest and Shortness of breath with exertion. Cardiovascular Not Present- Chest Pain, Difficulty Breathing Lying Down, Murmur, Palpitations, Racing/skipping heartbeats and Swelling. Gastrointestinal Present- Heartburn. Not Present- Abdominal Pain, Bloody Stool, Constipation, Diarrhea, Difficulty Swallowing, Jaundice, Loss of appetitie, Nausea and Vomiting. Female Genitourinary Not Present- Blood in Urine, Discharge, Flank Pain, Incontinence, Painful Urination, Urgency, Urinary frequency, Urinary Retention, Urinating at Night and Weak urinary stream. Musculoskeletal Present- Joint Pain and Morning Stiffness. Not Present- Back Pain, Joint Swelling, Muscle Pain, Muscle Weakness and Spasms. Neurological Not Present- Blackout spells, Difficulty with balance, Dizziness, Paralysis, Tremor and Weakness. Psychiatric Present- Insomnia.   Vitals  BP: 128/76 (Sitting, Right Arm, Standard)    Physical Exam  General Mental Status -Alert, cooperative and good historian. General Appearance-pleasant, Not in acute distress. Orientation-Oriented X3. Build & Nutrition-Well nourished and Well developed.  Head and Neck Head-normocephalic, atraumatic . Neck Global Assessment - supple, no bruit auscultated on the right, no bruit auscultated on the left.  Eye Pupil - Bilateral-Regular and Round. Motion - Bilateral-EOMI.  Chest and Lung Exam Auscultation Breath sounds - clear at anterior chest wall and clear at posterior chest wall. Adventitious sounds - No Adventitious sounds.  Cardiovascular Auscultation Rhythm - Regular rate and rhythm. Heart Sounds - S1 WNL and S2 WNL. Murmurs & Other Heart Sounds - Auscultation of the heart reveals - No Murmurs.  Abdomen Palpation/Percussion Tenderness - Abdomen is non-tender to palpation. Rigidity (guarding) - Abdomen is soft. Auscultation Auscultation of the abdomen  reveals - Bowel sounds normal.  Female Genitourinary Note: Not done, not pertinent to present illness   Musculoskeletal Note: On exam, she is alert and oriented in no apparent distress. Her right hip can be flexed 90. Minimal internal rotation, about 20 external rotation and 20 abduction. She has pain on range of motion. Left hip has normal motion.  RADIOGRAPHS: AP pelvis and lateral of the right hip show end stage arthritic change of that hip bone on bone throughout with subchondral cystic formation.   Assessment & Plan  Osteoarthritis of right hip (715.95  M16.11) Impression: Right Hip Note:Plan is for a Right Total Hip Replacement - Anterior Approach by Dr. Wynelle Link.  Plan is to go to [facility] for therapy.  PCP - Dr. Melvyn Novas - Patient has been seen preoperatively and felt to be stable for surgery.  The patient does not have any contraindications and will receive TXA (tranexamic acid) prior to surgery.  Signed electronically by Ok Edwards, III PA-C

## 2013-08-21 NOTE — Anesthesia Procedure Notes (Signed)
Spinal  Patient location during procedure: OR Start time: 08/21/2013 1:40 PM End time: 08/21/2013 2:45 PM Staffing CRNA/Resident: Rosielee Corporan G Performed by: resident/CRNA  Preanesthetic Checklist Completed: patient identified, site marked, surgical consent, pre-op evaluation, timeout performed, IV checked, risks and benefits discussed and monitors and equipment checked Spinal Block Patient position: sitting Prep: Betadine and site prepped and draped Patient monitoring: heart rate, continuous pulse ox and blood pressure Approach: midline Location: L2-3 Injection technique: single-shot Needle Needle type: Sprotte  Needle gauge: 24 G Needle length: 10 cm Needle insertion depth: 5 cm Assessment Sensory level: T6

## 2013-08-22 DIAGNOSIS — D62 Acute posthemorrhagic anemia: Secondary | ICD-10-CM | POA: Diagnosis not present

## 2013-08-22 LAB — CBC
HCT: 34.5 % — ABNORMAL LOW (ref 36.0–46.0)
HEMOGLOBIN: 11.6 g/dL — AB (ref 12.0–15.0)
MCH: 29.4 pg (ref 26.0–34.0)
MCHC: 33.6 g/dL (ref 30.0–36.0)
MCV: 87.6 fL (ref 78.0–100.0)
Platelets: 172 10*3/uL (ref 150–400)
RBC: 3.94 MIL/uL (ref 3.87–5.11)
RDW: 12.6 % (ref 11.5–15.5)
WBC: 9.7 10*3/uL (ref 4.0–10.5)

## 2013-08-22 LAB — BASIC METABOLIC PANEL
ANION GAP: 11 (ref 5–15)
BUN: 12 mg/dL (ref 6–23)
CALCIUM: 8.5 mg/dL (ref 8.4–10.5)
CHLORIDE: 102 meq/L (ref 96–112)
CO2: 24 meq/L (ref 19–32)
CREATININE: 0.73 mg/dL (ref 0.50–1.10)
GFR calc Af Amer: >90 mL/min (ref >90)
GFR calc non Af Amer: 88 mL/min — ABNORMAL LOW (ref >90)
GLUCOSE: 170 mg/dL — AB (ref 70–99)
Potassium: 4 mEq/L (ref 3.7–5.3)
Sodium: 137 mEq/L (ref 137–147)

## 2013-08-22 NOTE — Progress Notes (Signed)
CARE MANAGEMENT NOTE 08/22/2013  Patient:  KIRK, SAMPLEY   Account Number:  [id number]  Date Initiated:  08/22/2013  Documentation initiated by:  DAVIS,RHONDA  Subjective/Objective Assessment:   right total hip ant.     Action/Plan:   home versus snf snf planning is started by csw   Anticipated DC Date:  08/25/2013   Anticipated DC Plan:  SKILLED NURSING FACILITY  In-house referral  Clinical Social Worker      DC Planning Services  CM consult      [facility] Choice  NA   Choice offered to / List presented to:  C-1 Patient           [employer]   Status of service:  In process, will continue to follow Medicare Important Message given?  NA - LOS <3 / Initial given by admissions (If response is "NO", the following Medicare IM given date fields will be blank) Date Medicare IM given:   Medicare IM given by:   Date Additional Medicare IM given:   Additional Medicare IM given by:    Discharge Disposition:    Per UR Regulation:  Reviewed for med. necessity/level of care/duration of stay  If discussed at [place] of Stay Meetings, dates discussed:    Comments:  [id number]/Rhonda Eldridge Dace, [place], [place] [phone] Chart Reviewed for discharge and hospital needs. Discharge needs at time of review: None present will follow for needs. Review of patient progress due on [id number]

## 2013-08-22 NOTE — Progress Notes (Signed)
Physical Therapy Treatment Patient Details Name: [name] MRN: [id number] DOB: 1950-05-06 Today's Date: 09-09-13    History of Present Illness Pt is s/p R direct anterior THA    PT Comments    Progressing well  Follow Up Recommendations  Home health PT;SNF     Equipment Recommendations  None recommended by PT    Recommendations for Other Services OT consult     Precautions / Restrictions Precautions Precautions: Fall Restrictions Weight Bearing Restrictions: No Other Position/Activity Restrictions: WBAT    Mobility  Bed Mobility                  Transfers Overall transfer level: Needs assistance Equipment used: Rolling walker (2 wheeled) Transfers: Sit to/from Stand Sit to Stand: Min guard         General transfer comment: verbal cues for hand placement and LE management. guard for safety.  Ambulation/Gait Ambulation/Gait assistance: Min assist;Min guard Ambulation Distance (Feet): 147 Feet (twice) Assistive device: Rolling walker (2 wheeled) Gait Pattern/deviations: Step-to pattern;Step-through pattern;Decreased step length - right;Decreased step length - left;Shuffle;Trunk flexed     General Gait Details: cues for posture, position from RW, and initial sequence   Stairs            Wheelchair Mobility    Modified Rankin (Stroke Patients Only)       Balance                                    Cognition Arousal/Alertness: Awake/alert Behavior During Therapy: WFL for tasks assessed/performed Overall Cognitive Status: Within Functional Limits for tasks assessed                      Exercises      General Comments        Pertinent Vitals/Pain Pain Assessment: 0-10 Pain Score: 4  Pain Location: R hip Pain Descriptors / Indicators: Aching Pain Intervention(s): Premedicated before session;Ice applied;Limited activity within patient's tolerance    Home Living                      Prior  Function            PT Goals (current goals can now be found in the care plan section) Acute Rehab PT Goals Patient Stated Goal: return to independence. PT Goal Formulation: With patient Time For Goal Achievement: 08/29/13 Potential to Achieve Goals: Good Progress towards PT goals: Progressing toward goals    Frequency  7X/week    PT Plan Current plan remains appropriate    Co-evaluation             End of Session Equipment Utilized During Treatment: Gait belt Activity Tolerance: Patient tolerated treatment well Patient left: in chair;with call bell/phone within reach     Time: 3300-7622 PT Time Calculation (min): 28 min  Charges:  $Gait Training: 23-37 mins                    G Codes:      Humaira Sculley Sep 09, 2013, 2:24 PM

## 2013-08-22 NOTE — Evaluation (Signed)
Occupational Therapy Evaluation Patient Details Name: [name] MRN: [id number] DOB: 09/08/1950 Today's Date: 08/22/2013    History of Present Illness Pt is s/p R direct anterior THA   Clinical Impression   Pt is very motivated and doing well with toilet transfers today and ADL. Will benefit from continued OT while on acute to progress ADL independence and safety.    Follow Up Recommendations  SNF    Equipment Recommendations  None recommended by OT    Recommendations for Other Services       Precautions / Restrictions Restrictions Weight Bearing Restrictions: No      Mobility Bed Mobility               General bed mobility comments: in chair when OT arrived.  Transfers Overall transfer level: Needs assistance Equipment used: Rolling walker (2 wheeled) Transfers: Sit to/from Stand Sit to Stand: Min guard         General transfer comment: verbal cues for hand placement and LE management. guard for safety.    Balance                                            ADL Overall ADL's : Needs assistance/impaired Eating/Feeding: Independent;Sitting   Grooming: Wash/dry hands;Oral care;Set up;Sitting   Upper Body Bathing: Set up;Sitting   Lower Body Bathing: Minimal assistance;Sit to/from stand   Upper Body Dressing : Set up;Sitting   Lower Body Dressing: Set up;Minimal assistance;Sit to/from stand   Toilet Transfer: Min guard;Ambulation;BSC;RW   Toileting- Water quality scientist and Hygiene: Min guard;Sit to/from stand         General ADL Comments: Educated on AE options for LB dressing. Pt needs frequent verbal cues for safety with hand placement and walker use safely. She lives alone. She is interested in possibly obtaining AE kit. Reviewed sequence for LB dressing also.      Vision                     Perception     Praxis      Pertinent Vitals/Pain 3/10 R hip; reposition, nursing aware, ice     Hand  Dominance     Extremity/Trunk Assessment Upper Extremity Assessment Upper Extremity Assessment: Overall WFL for tasks assessed           Communication Communication Communication: No difficulties   Cognition Arousal/Alertness: Awake/alert Behavior During Therapy: WFL for tasks assessed/performed Overall Cognitive Status: Within Functional Limits for tasks assessed                     General Comments       Exercises       Shoulder Instructions      Home Living Family/patient expects to be discharged to:: Skilled nursing facility (home versus SNF) Living Arrangements: Alone                                      Prior Functioning/Environment Level of Independence: Independent        Comments: pt owns a rolling walker and toilet riser with handles. She has  walk in shower.    OT Diagnosis: Generalized weakness   OT Problem List: Decreased strength;Decreased knowledge of use of DME or AE   OT Treatment/Interventions: Self-care/ADL training;Patient/family education;Therapeutic activities;DME and/or  AE instruction    OT Goals(Current goals can be found in the care plan section) Acute Rehab OT Goals Patient Stated Goal: return to independence. OT Goal Formulation: With patient Time For Goal Achievement: 08/29/13 Potential to Achieve Goals: Good  OT Frequency: Min 2X/week   Barriers to D/C:            Co-evaluation              End of Session Equipment Utilized During Treatment: Rolling walker  Activity Tolerance: Patient tolerated treatment well Patient left: in chair;with call bell/phone within reach   Time: 0908-0940 OT Time Calculation (min): 32 min Charges:  OT General Charges $OT Visit: 1 Procedure OT Evaluation $Initial OT Evaluation Tier I: 1 Procedure OT Treatments $Self Care/Home Management : 8-22 mins $Therapeutic Activity: 8-22 mins G-Codes:    [name] [phone] 08/22/2013, 9:48 AM

## 2013-08-22 NOTE — Progress Notes (Signed)
Clinical Social Work Department BRIEF PSYCHOSOCIAL ASSESSMENT 08/22/2013  Patient:  [name], [name]     Account Number:  [id number]     Admit date:  08/21/2013  Clinical Social Worker:  Lacie Scotts  Date/Time:  08/22/2013 09:07 AM  Referred by:  Physician  Date Referred:  08/22/2013 Referred for  SNF Placement   Other Referral:   Interview type:  Patient Other interview type:    PSYCHOSOCIAL DATA Living Status:  ALONE Admitted from facility:   Level of care:   Primary support name:  Z. W. Lau Primary support relationship to patient:  SIBLING Degree of support available:   unclear    CURRENT CONCERNS Current Concerns  Post-Acute Placement   Other Concerns:    SOCIAL WORK ASSESSMENT / PLAN Pt is a 63 yr old female living at home prior to hospitalization. CSW met with pt to assist with d/c planning. This is a planned admission. Pt has made prior arrangements to have ST Rehab at [facility] following hospital d/c. CSW has contacted SNF and d/c plans have been confirmed.  CSW will continue to follow to assist with d/c planning to SNF.   Assessment/plan status:  Psychosocial Support/Ongoing Assessment of Needs Other assessment/ plan:   Information/referral to community resources:   Insurance coverage for SNF and ambulance transport reviewed.    PATIENT'S/FAMILY'S RESPONSE TO PLAN OF CARE: Pt's mood is bright this am. She slept little last night but pain is under control. She is motivated to begin therapy and is looking forward to having rehab at [facility].   Madeline Lean LCSW [phone]

## 2013-08-22 NOTE — Evaluation (Signed)
Physical Therapy Evaluation Patient Details Name: [name] MRN: [id number] DOB: 04-10-1950 Today's Date: 08/22/2013   History of Present Illness  Pt is s/p R direct anterior THA  Clinical Impression  Pt s/p R THR presents with decreased R LE strength/ROM and post op pain limiting functional mobility.  Dependent on acute stay progress, pt could benefit from follow up rehab at SNF level to maximize IND and safety prior to return home alone.    Follow Up Recommendations Home health PT;SNF    Equipment Recommendations  None recommended by PT    Recommendations for Other Services OT consult     Precautions / Restrictions Precautions Precautions: Fall Restrictions Weight Bearing Restrictions: No Other Position/Activity Restrictions: WBAT      Mobility  Bed Mobility Overal bed mobility: Needs Assistance Bed Mobility: Supine to Sit     Supine to sit: Min assist     General bed mobility comments: in chair when OT arrived.  Transfers Overall transfer level: Needs assistance Equipment used: Rolling walker (2 wheeled) Transfers: Sit to/from Stand Sit to Stand: Min guard         General transfer comment: verbal cues for hand placement and LE management. guard for safety.  Ambulation/Gait Ambulation/Gait assistance: Min assist Ambulation Distance (Feet): 83 Feet Assistive device: Rolling walker (2 wheeled) Gait Pattern/deviations: Step-to pattern;Decreased step length - right;Decreased step length - left;Shuffle;Trunk flexed     General Gait Details: cues for posture, position from RW, and sequence  Stairs            Wheelchair Mobility    Modified Rankin (Stroke Patients Only)       Balance                                             Pertinent Vitals/Pain 5/10; premed, ice pack provided    Home Living Family/patient expects to be discharged to:: Skilled nursing facility (home versus SNF) Living Arrangements: Alone   Type  of Home: House Home Access: Stairs to enter Entrance Stairs-Rails: None Entrance Stairs-Number of Steps: 3 Home Layout: One level Home Equipment: Walker - 2 wheels;Cane - single point Additional Comments: Pt is considering SNF rehab 2* lack of assist at home    Prior Function Level of Independence: Independent         Comments: pt owns a rolling walker and toilet riser with handles. She has  walk in shower.     Hand Dominance   Dominant Hand: Right    Extremity/Trunk Assessment   Upper Extremity Assessment: Overall WFL for tasks assessed           Lower Extremity Assessment: RLE deficits/detail RLE Deficits / Details: hip strength 2+/5 with AAROM at hip to 90 flex and 20 abd    Cervical / Trunk Assessment: Normal  Communication   Communication: No difficulties  Cognition Arousal/Alertness: Awake/alert Behavior During Therapy: WFL for tasks assessed/performed Overall Cognitive Status: Within Functional Limits for tasks assessed                      General Comments General comments (skin integrity, edema, etc.): min guard assist in standing to perform hygiene at commode.    Exercises Total Joint Exercises Ankle Circles/Pumps: AROM;Both;15 reps;Supine Quad Sets: AROM;Both;10 reps;Supine Heel Slides: AAROM;Right;15 reps;Supine Hip ABduction/ADduction: AAROM;Right;10 reps;Supine      Assessment/Plan    PT Assessment  Patient needs continued PT services  PT Diagnosis Difficulty walking   PT Problem List Decreased strength;Decreased range of motion;Decreased activity tolerance;Decreased mobility;Decreased knowledge of use of DME;Pain  PT Treatment Interventions DME instruction;Gait training;Stair training;Functional mobility training;Therapeutic activities;Therapeutic exercise;Patient/family education   PT Goals (Current goals can be found in the Care Plan section) Acute Rehab PT Goals Patient Stated Goal: return to independence. PT Goal Formulation:  With patient Time For Goal Achievement: 08/29/13 Potential to Achieve Goals: Good    Frequency 7X/week   Barriers to discharge Decreased caregiver support Home alone    Co-evaluation               End of Session Equipment Utilized During Treatment: Gait belt Activity Tolerance: Patient tolerated treatment well Patient left: in chair;with call bell/phone within reach Nurse Communication: Mobility status         Time: 0821-0857 PT Time Calculation (min): 36 min   Charges:   PT Evaluation $Initial PT Evaluation Tier I: 1 Procedure PT Treatments $Gait Training: 8-22 mins $Therapeutic Exercise: 8-22 mins   PT G Codes:          [name] 08/22/2013, 12:14 PM

## 2013-08-22 NOTE — Progress Notes (Signed)
Subjective: 1 Day Post-Op Procedure(s) (LRB): RIGHT TOTAL HIP ARTHROPLASTY ANTERIOR APPROACH (Right) Patient reports pain as mild.   Patient seen in rounds with Dr. Aluisio. Patient is well, and has had no acute complaints or problems We will start therapy today.  Plan is to go either home versus SNF after hospital stay. Will see how she does with therapy.  Objective: Vital signs in last 24 hours: Temp:  [97.6 F (36.4 C)-99.5 F (37.5 C)] 98 F (36.7 C) (08/06 0515) Pulse Rate:  [59-80] 59 (08/06 0515) Resp:  [15-20] 16 (08/06 0515) BP: (111-150)/(55-72) 118/55 mmHg (08/06 0515) SpO2:  [94 %-100 %] 96 % (08/06 0515) Weight:  [73.483 kg (162 lb)] 73.483 kg (162 lb) (08/05 1059)  Intake/Output from previous day:  Intake/Output Summary (Last 24 hours) at 08/22/13 0742 Last data filed at 08/22/13 0624  Gross per 24 hour  Intake 4038.75 ml  Output   2525 ml  Net 1513.75 ml    Intake/Output this shift:    Labs:  Recent Labs  08/22/13 0445  HGB 11.6*    Recent Labs  08/22/13 0445  WBC 9.7  RBC 3.94  HCT 34.5*  PLT 172    Recent Labs  08/22/13 0445  NA 137  K 4.0  CL 102  CO2 24  BUN 12  CREATININE 0.73  GLUCOSE 170*  CALCIUM 8.5   No results found for this basename: LABPT, INR,  in the last 72 hours  EXAM General - Patient is Alert, Appropriate and Oriented Extremity - Neurovascular intact Sensation intact distally Dorsiflexion/Plantar flexion intact Dressing - dressing C/D/I Motor Function - intact, moving foot and toes well on exam.  Hemovac pulled without difficulty.  Past Medical History  Diagnosis Date  . Hypertension   . Hypothyroidism   . Dyslipidemia   . Osteopenia   . DJD (degenerative joint disease) of hip     right - Alusio  . Vitreous floaters of left eye 04/2013  . PONV (postoperative nausea and vomiting)   . Pneumonia     hx of pneumonia - lst time 1998  . Anxiety   . GERD (gastroesophageal reflux disease)   . Cancer       basal skin cancer     Assessment/Plan: 1 Day Post-Op Procedure(s) (LRB): RIGHT TOTAL HIP ARTHROPLASTY ANTERIOR APPROACH (Right) Principal Problem:   OA (osteoarthritis) of hip Active Problems:   Postoperative anemia due to acute blood loss  Estimated body mass index is 27.79 kg/(m^2) as calculated from the following:   Height as of this encounter: 5' 4" (1.626 m).   Weight as of this encounter: 73.483 kg (162 lb). Advance diet Up with therapy Discharge home with home health versus SNF depending upon progress.  DVT Prophylaxis - Xarelto Weight Bearing As Tolerated right Leg Hemovac Pulled Begin Therapy  Drew Perkins, PA-C Orthopaedic Surgery 08/22/2013, 7:42 AM   

## 2013-08-22 NOTE — Discharge Instructions (Addendum)
°Dr. Frank Aluisio °Total Joint Specialist °[facility] °[address] °[place], [place] [address] °[phone] ° ° ° °ANTERIOR APPROACH TOTAL HIP REPLACEMENT POSTOPERATIVE DIRECTIONS ° ° °Hip Rehabilitation, Guidelines Following Surgery  °The results of a hip operation are greatly improved after range of motion and muscle strengthening exercises. Follow all safety measures which are given to protect your hip. If any of these exercises cause increased pain or swelling in your joint, decrease the amount until you are comfortable again. Then slowly increase the exercises. Call your caregiver if you have problems or questions.  °HOME CARE INSTRUCTIONS  °Most of the following instructions are designed to prevent the dislocation of your new hip.  °Remove items at home which could result in a fall. This includes throw rugs or furniture in walking pathways.  °Continue medications as instructed at time of discharge. °· You may have some home medications which will be placed on hold until you complete the course of blood thinner medication. °· You may start showering once you are discharged home but do not submerge the incision under water. Just pat the incision dry and apply a dry gauze dressing on daily. °Do not put on socks or shoes without following the instructions of your caregivers.  °Sit on high chairs which makes it easier to stand.  °Sit on chairs with arms. Use the chair arms to help push yourself up when arising.  °Keep your leg on the side of the operation out in front of you when standing up.  °Arrange for the use of a toilet seat elevator so you are not sitting low.   °· Walk with walker as instructed.  °You may resume a sexual relationship in one month or when given the OK by your caregiver.  °Use walker as long as suggested by your caregivers.  °You may put full weight on your legs and walk as much as is comfortable. °Avoid periods of inactivity such as sitting longer than an hour  when not asleep. This helps prevent blood clots.  °You may return to work once you are cleared by your surgeon.  °Do not drive a car for 6 weeks or until released by your surgeon.  °Do not drive while taking narcotics.  °Wear elastic stockings for three weeks following surgery during the day but you may remove then at night.  °Make sure you keep all of your appointments after your operation with all of your doctors and caregivers. You should call the office at the above phone number and make an appointment for approximately two weeks after the date of your surgery. °Change the dressing daily and reapply a dry dressing each time. °Please pick up a stool softener and laxative for home use as long as you are requiring pain medications. °· Continue to use ice on the hip for pain and swelling from surgery. You may notice swelling that will progress down to the foot and ankle.  This is normal after  surgery.  Elevate the leg when you are not up walking on it.   °It is important for you to complete the blood thinner medication as prescribed by your doctor. °· Continue to use the breathing machine which will help keep your temperature down.  It is common for your temperature to cycle up and down following surgery, especially at night when you are not up moving around and exerting yourself.  The breathing machine keeps your lungs expanded and your temperature down. ° °RANGE OF MOTION AND STRENGTHENING EXERCISES  °  These exercises are designed to help you keep full movement of your hip joint. Follow your caregiver's or physical therapist's instructions. Perform all exercises about fifteen times, three times per day or as directed. Exercise both hips, even if you have had only one joint replacement. These exercises can be done on a training (exercise) mat, on the floor, on a table or on a bed. Use whatever works the best and is most comfortable for you. Use music or television while you are exercising so that the exercises are  a pleasant break in your day. This will make your life better with the exercises acting as a break in routine you can look forward to.  Lying on your back, slowly slide your foot toward your buttocks, raising your knee up off the floor. Then slowly slide your foot back down until your leg is straight again.  Lying on your back spread your legs as far apart as you can without causing discomfort.  Lying on your side, raise your upper leg and foot straight up from the floor as far as is comfortable. Slowly lower the leg and repeat.  Lying on your back, tighten up the muscle in the front of your thigh (quadriceps muscles). You can do this by keeping your leg straight and trying to raise your heel off the floor. This helps strengthen the largest muscle supporting your knee.  Lying on your back, tighten up the muscles of your buttocks both with the legs straight and with the knee bent at a comfortable angle while keeping your heel on the floor.   SKILLED REHAB INSTRUCTIONS: If the patient is transferred to a skilled rehab facility following release from the hospital, a list of the current medications will be sent to the facility for the patient to continue.  When discharged from the skilled rehab facility, please have the facility set up the patient's [place] prior to being released. Also, the skilled facility will be responsible for providing the patient with their medications at time of release from the facility to include their pain medication, the muscle relaxants, and their blood thinner medication. If the patient is still at the rehab facility at time of the two week follow up appointment, the skilled rehab facility will also need to assist the patient in arranging follow up appointment in our office and any transportation needs.  MAKE SURE YOU:  Understand these instructions.  Will watch your condition.  Will get help right away if you are not doing well or get worse.  Pick up  stool softner and laxative for home. Do not submerge incision under water. May shower. Continue to use ice for pain and swelling from surgery. Total Hip Protocol.  Take Xarelto for two and a half more weeks, then discontinue Xarelto. Once the patient has completed the Xarelto, they may resume the 81 mg Aspirin.  Information on my medicine - XARELTO (Rivaroxaban)  This medication education was reviewed with me or my healthcare representative as part of my discharge preparation.  The pharmacist that spoke with me during my hospital stay was:  Absher, Julieta Bellini, RPH  Why was Xarelto prescribed for you? Xarelto was prescribed for you to reduce the risk of blood clots forming after orthopedic surgery. The medical term for these abnormal blood clots is venous thromboembolism (VTE).  What do you need to know about xarelto ? Take your Xarelto ONCE DAILY at the same time every day. You may take it either with or without food.  If you have difficulty swallowing the tablet whole, you may crush it and mix in applesauce just prior to taking your dose.  Take Xarelto exactly as prescribed by your doctor and DO NOT stop taking Xarelto without talking to the doctor who prescribed the medication.  Stopping without other VTE prevention medication to take the place of Xarelto may increase your risk of developing a clot.  After discharge, you should have regular check-up appointments with your healthcare provider that is prescribing your Xarelto.    What do you do if you miss a dose? If you miss a dose, take it as soon as you remember on the same day then continue your regularly scheduled once daily regimen the next day. Do not take two doses of Xarelto on the same day.   Important Safety Information A possible side effect of Xarelto is bleeding. You should call your healthcare provider right away if you experience any of the following:   Bleeding from an injury or your nose that does not  stop.   Unusual colored urine (red or dark brown) or unusual colored stools (red or black).   Unusual bruising for unknown reasons.   A serious fall or if you hit your head (even if there is no bleeding).  Some medicines may interact with Xarelto and might increase your risk of bleeding while on Xarelto. To help avoid this, consult your healthcare provider or pharmacist prior to using any new prescription or non-prescription medications, including herbals, vitamins, non-steroidal anti-inflammatory drugs (NSAIDs) and supplements.  This website has more information on Xarelto: [email or web address]

## 2013-08-22 NOTE — Progress Notes (Signed)
Clinical Social Work Department CLINICAL SOCIAL WORK PLACEMENT NOTE 08/22/2013  Patient:  [name], [name]  Account Number:  [id number] Admit date:  08/21/2013  Clinical Social Worker:  Werner Lean, LCSW  Date/time:  08/22/2013 09:13 AM  Clinical Social Work is seeking post-discharge placement for this patient at the following level of care:   SKILLED NURSING   (*CSW will update this form in Epic as items are completed)     Patient/family provided with [facility] Department of Clinical Social Work's list of facilities offering this level of care within the geographic area requested by the patient (or if unable, by the patient's family).  08/22/2013  Patient/family informed of their freedom to choose among providers that offer the needed level of care, that participate in Medicare, Medicaid or managed care program needed by the patient, have an available bed and are willing to accept the patient.    Patient/family informed of [facility]' ownership interest in [facility], as well as of the fact that they are under no obligation to receive care at this facility.  PASARR submitted to EDS on 08/21/2013 PASARR number received on 08/21/2013  FL2 transmitted to all facilities in geographic area requested by pt/family on  08/22/2013 FL2 transmitted to all facilities within larger geographic area on   Patient informed that his/her managed care company has contracts with or will negotiate with  certain facilities, including the following:     Patient/family informed of bed offers received:  08/22/2013 Patient chooses bed at [place] Physician recommends and patient chooses bed at    Patient to be transferred to  on   Patient to be transferred to facility by  Patient and family notified of transfer on  Name of family member notified:    The following physician request were entered in Epic:   Additional Comments:  Werner Lean LCSW [phone]

## 2013-08-23 DIAGNOSIS — E871 Hypo-osmolality and hyponatremia: Secondary | ICD-10-CM

## 2013-08-23 LAB — BASIC METABOLIC PANEL
Anion gap: 9 (ref 5–15)
BUN: 10 mg/dL (ref 6–23)
CO2: 27 mEq/L (ref 19–32)
Calcium: 8.8 mg/dL (ref 8.4–10.5)
Chloride: 99 mEq/L (ref 96–112)
Creatinine, Ser: 0.69 mg/dL (ref 0.50–1.10)
GFR calc Af Amer: >90 mL/min (ref >90)
GFR calc non Af Amer: 89 mL/min — ABNORMAL LOW (ref >90)
Glucose, Bld: 139 mg/dL — ABNORMAL HIGH (ref 70–99)
Potassium: 4.2 mEq/L (ref 3.7–5.3)
Sodium: 135 mEq/L — ABNORMAL LOW (ref 137–147)

## 2013-08-23 LAB — CBC
HCT: 34.4 % — ABNORMAL LOW (ref 36.0–46.0)
Hemoglobin: 11.7 g/dL — ABNORMAL LOW (ref 12.0–15.0)
MCH: 29.7 pg (ref 26.0–34.0)
MCHC: 34 g/dL (ref 30.0–36.0)
MCV: 87.3 fL (ref 78.0–100.0)
Platelets: 170 10*3/uL (ref 150–400)
RBC: 3.94 MIL/uL (ref 3.87–5.11)
RDW: 12.7 % (ref 11.5–15.5)
WBC: 11.5 10*3/uL — ABNORMAL HIGH (ref 4.0–10.5)

## 2013-08-23 NOTE — Progress Notes (Signed)
Physical Therapy Treatment Patient Details Name: [name] MRN: [id number] DOB: [birth year] Today's Date: 08/23/2013    History of Present Illness Pt is s/p R direct anterior THA    PT Comments    Progressing well  Follow Up Recommendations  Home health PT;SNF     Equipment Recommendations  None recommended by PT    Recommendations for Other Services OT consult     Precautions / Restrictions Precautions Precautions: Fall Restrictions Weight Bearing Restrictions: No Other Position/Activity Restrictions: WBAT    Mobility  Bed Mobility Overal bed mobility: Needs Assistance Bed Mobility: Sit to Supine       Sit to supine: Min guard   General bed mobility comments: cues for sequence and use of L LE to self assist  Transfers Overall transfer level: Needs assistance Equipment used: Rolling walker (2 wheeled) Transfers: Sit to/from Stand Sit to Stand: Min guard         General transfer comment: verbal cues for hand placement.  Ambulation/Gait Ambulation/Gait assistance: Min guard;Supervision Ambulation Distance (Feet): 150 Feet Assistive device: Rolling walker (2 wheeled) Gait Pattern/deviations: Step-to pattern;Decreased step length - right;Decreased step length - left;Shuffle;Trunk flexed     General Gait Details: cues for posture, position from RW, and initial sequence   Stairs Stairs: Yes Stairs assistance: Min assist Stair Management: No rails;One rail Right;Step to pattern;With walker;With cane;Forwards Number of Stairs: 6 General stair comments: cues for sequence and foot/cane/RW placement.  4 steps fwd with cane and one step fwd and bkwd with RW  Wheelchair Mobility    Modified Rankin (Stroke Patients Only)       Balance                                    Cognition Arousal/Alertness: Awake/alert Behavior During Therapy: WFL for tasks assessed/performed Overall Cognitive Status: Within Functional Limits for tasks  assessed                      Exercises Total Joint Exercises Ankle Circles/Pumps: AROM;Both;15 reps;Supine Quad Sets: AROM;Both;10 reps;Supine Gluteal Sets: AROM;Both;10 reps;Supine Heel Slides: AAROM;Right;Supine;20 reps Hip ABduction/ADduction: AAROM;Right;Supine;20 reps    General Comments        Pertinent Vitals/Pain Pain Assessment: 0-10 Pain Score: 3  Pain Location: R hip Pain Descriptors / Indicators: Aching Pain Intervention(s): Limited activity within patient's tolerance;Premedicated before session;Ice applied    Home Living                      Prior Function            PT Goals (current goals can now be found in the care plan section) Acute Rehab PT Goals Patient Stated Goal: return to independence. PT Goal Formulation: With patient Time For Goal Achievement: 08/29/13 Potential to Achieve Goals: Good Progress towards PT goals: Progressing toward goals    Frequency  7X/week    PT Plan Current plan remains appropriate    Co-evaluation             End of Session Equipment Utilized During Treatment: Gait belt Activity Tolerance: Patient tolerated treatment well Patient left: in bed;with call bell/phone within reach     Time: 2751-7001 PT Time Calculation (min): 43 min  Charges:  $Gait Training: 8-22 mins $Therapeutic Exercise: 8-22 mins $Therapeutic Activity: 8-22 mins  G Codes:      Cataleah Stites 09-06-2013, 3:16 PM

## 2013-08-23 NOTE — Progress Notes (Signed)
CSW assisting with d/c planning. Pt has a ST rehab bed at [facility] on [address] if placement is needed. Weekend CSW will assist with d/c planning to SNF. At this point, pt is not sure if she will return home with [facility] services or accept rehab placement.   Werner Lean LCSW [phone]

## 2013-08-23 NOTE — Progress Notes (Signed)
Physical Therapy Treatment Patient Details Name: [name] MRN: [id number] DOB: May 07, 1950 Today's Date: August 29, 2013    History of Present Illness Pt is s/p R direct anterior THA    PT Comments    Progressing steadily but more pain limited today.  Follow Up Recommendations  Home health PT;SNF     Equipment Recommendations  None recommended by PT    Recommendations for Other Services OT consult     Precautions / Restrictions Precautions Precautions: Fall Restrictions Weight Bearing Restrictions: No Other Position/Activity Restrictions: WBAT    Mobility  Bed Mobility Overal bed mobility: Needs Assistance Bed Mobility: Sit to Supine       Sit to supine: Min assist   General bed mobility comments: cues for sequence and use of L LE to self assist  Transfers Overall transfer level: Needs assistance Equipment used: Rolling walker (2 wheeled) Transfers: Sit to/from Stand Sit to Stand: Min guard         General transfer comment: verbal cues for hand placement.  Ambulation/Gait Ambulation/Gait assistance: Min guard Ambulation Distance (Feet): 123 Feet Assistive device: Rolling walker (2 wheeled) Gait Pattern/deviations: Step-to pattern;Decreased step length - right;Decreased step length - left;Shuffle;Trunk flexed     General Gait Details: cues for posture, position from RW, and initial sequence   Stairs            Wheelchair Mobility    Modified Rankin (Stroke Patients Only)       Balance                                    Cognition Arousal/Alertness: Awake/alert Behavior During Therapy: WFL for tasks assessed/performed Overall Cognitive Status: Within Functional Limits for tasks assessed                      Exercises Total Joint Exercises Ankle Circles/Pumps: AROM;Both;15 reps;Supine Quad Sets: AROM;Both;10 reps;Supine Gluteal Sets: AROM;Both;10 reps;Supine Heel Slides: AAROM;Right;Supine;20 reps Hip  ABduction/ADduction: AAROM;Right;Supine;20 reps    General Comments        Pertinent Vitals/Pain Pain Assessment: 0-10 Pain Score: 5  Pain Location: R hip Pain Descriptors / Indicators: Aching;Burning Pain Intervention(s): Limited activity within patient's tolerance;Ice applied;Premedicated before session    Home Living                      Prior Function            PT Goals (current goals can now be found in the care plan section) Acute Rehab PT Goals Patient Stated Goal: return to independence. PT Goal Formulation: With patient Time For Goal Achievement: 08/29/13 Potential to Achieve Goals: Good Progress towards PT goals: Progressing toward goals    Frequency  7X/week    PT Plan Current plan remains appropriate    Co-evaluation             End of Session Equipment Utilized During Treatment: Gait belt Activity Tolerance: Patient tolerated treatment well Patient left: in chair;with call bell/phone within reach     Time: 1140-1208 PT Time Calculation (min): 28 min  Charges:  $Gait Training: 8-22 mins $Therapeutic Exercise: 8-22 mins                    G Codes:      Kamarri Fischetti 2013-08-29, 12:59 PM

## 2013-08-23 NOTE — Progress Notes (Signed)
   Subjective: 2 Days Post-Op Procedure(s) (LRB): RIGHT TOTAL HIP ARTHROPLASTY ANTERIOR APPROACH (Right) Patient reports pain as mild.   Patient seen in rounds with Dr. Wynelle Link. Patient is well, and has had no acute complaints or problems We will start therapy today.  Plan is to go home versus SNF after hospital stay.  Objective: Vital signs in last 24 hours: Temp:  [98.4 F (36.9 C)-98.7 F (37.1 C)] 98.7 F (37.1 C) (08/07 0619) Pulse Rate:  [71-73] 71 (08/07 0619) Resp:  [16-18] 18 (08/07 0619) BP: (117-131)/(56-71) 117/56 mmHg (08/07 0619) SpO2:  [96 %-100 %] 100 % (08/07 0619)  Intake/Output from previous day:  Intake/Output Summary (Last 24 hours) at 08/23/13 1113 Last data filed at 08/23/13 0815  Gross per 24 hour  Intake 1328.5 ml  Output   3350 ml  Net -2021.5 ml    Intake/Output this shift: Total I/O In: 240 [P.O.:240] Out: 150 [Urine:150]  Labs:  Recent Labs  08/22/13 0445 08/23/13 0443  HGB 11.6* 11.7*    Recent Labs  08/22/13 0445 08/23/13 0443  WBC 9.7 11.5*  RBC 3.94 3.94  HCT 34.5* 34.4*  PLT 172 170    Recent Labs  08/22/13 0445 08/23/13 0443  NA 137 135*  K 4.0 4.2  CL 102 99  CO2 24 27  BUN 12 10  CREATININE 0.73 0.69  GLUCOSE 170* 139*  CALCIUM 8.5 8.8   No results found for this basename: LABPT, INR,  in the last 72 hours  EXAM General - Patient is Alert, Appropriate and Oriented Extremity - Neurovascular intact Sensation intact distally Dressing - dressing C/D/I Motor Function - intact, moving foot and toes well on exam.   Past Medical History  Diagnosis Date  . Hypertension   . Hypothyroidism   . Dyslipidemia   . Osteopenia   . DJD (degenerative joint disease) of hip     right - Alusio  . Vitreous floaters of left eye 04/2013  . PONV (postoperative nausea and vomiting)   . Pneumonia     hx of pneumonia - lst time 1998  . Anxiety   . GERD (gastroesophageal reflux disease)   . Cancer     basal skin cancer       Assessment/Plan: 2 Days Post-Op Procedure(s) (LRB): RIGHT TOTAL HIP ARTHROPLASTY ANTERIOR APPROACH (Right) Principal Problem:   OA (osteoarthritis) of hip Active Problems:   Postoperative anemia due to acute blood loss  Estimated body mass index is 27.79 kg/(m^2) as calculated from the following:   Height as of this encounter: 5' 4" (1.626 m).   Weight as of this encounter: 73.483 kg (162 lb). Up with therapy Plan for discharge tomorrow  DVT Prophylaxis - Xarelto Weight Bearing As Tolerated right Leg   [name], PA-C Orthopaedic Surgery 08/23/2013, 11:13 AM

## 2013-08-23 NOTE — Progress Notes (Signed)
Occupational Therapy Treatment Patient Details Name: [name] MRN: [id number] DOB: Dec 23, 1950 Today's Date: 08/23/2013    History of present illness Pt is s/p R direct anterior THA   OT comments  Practiced with AE for LB dressing this visit and also into the bathroom for toileting and grooming with walker. Pt progressing well. She states her R hip feels stiff today.   Follow Up Recommendations  SNF;Home health OT    Equipment Recommendations  None recommended by OT    Recommendations for Other Services      Precautions / Restrictions Precautions Precautions: Fall Restrictions Weight Bearing Restrictions: No Other Position/Activity Restrictions: WBAT       Mobility Bed Mobility                  Transfers Overall transfer level: Needs assistance Equipment used: Rolling walker (2 wheeled) Transfers: Sit to/from Stand Sit to Stand: Min guard         General transfer comment: verbal cues for hand placement.    Balance                                   ADL       Grooming: Wash/dry hands;Min guard;Standing               Lower Body Dressing: Min guard;Sit to/from stand Lower Body Dressing Details (indicate cue type and reason): used sock aid to don sock and reacher to doff R sock. Unable to reach to R foot yet. Able to thread underwear over LEs without AE and min guard to pull up in standing.  Toilet Transfer: Min guard;Ambulation;BSC;RW   Toileting- Water quality scientist and Hygiene: Min guard;Sit to/from stand         General ADL Comments: Pt able to retreive underwear from bag at window seat with walker with cues. She donned underwear without reacher but needed AE to doff and don R sock. Pt states R hip feels stiff today and difficulty with lifting R LE off the floor. Min cues for walker safety and distance of walker to self.       Vision                     Perception     Praxis      Cognition   Behavior  During Therapy: WFL for tasks assessed/performed Overall Cognitive Status: Within Functional Limits for tasks assessed                       Extremity/Trunk Assessment               Exercises     Shoulder Instructions       General Comments      Pertinent Vitals/ Pain       Pain Assessment: 0-10 Pain Score: 6  Pain Location: R hip Pain Descriptors / Indicators: Aching Pain Intervention(s): Repositioned;Other (comment) (nursing tech present and will obtain ice for hip)  Home Living                                          Prior Functioning/Environment              Frequency Min 2X/week     Progress Toward Goals  OT Goals(current goals can now be found  in the care plan section)  Progress towards OT goals: Progressing toward goals     Plan Discharge plan remains appropriate    Co-evaluation                 End of Session Equipment Utilized During Treatment: Rolling walker   Activity Tolerance Patient tolerated treatment well   Patient Left in chair;with call bell/phone within reach   Nurse Communication          Time: [phone] OT Time Calculation (min): 33 min  Charges: OT General Charges $OT Visit: 1 Procedure OT Treatments $Self Care/Home Management : 8-22 mins $Therapeutic Activity: 8-22 mins  Jules Schick [phone] 08/23/2013, 9:49 AM

## 2013-08-24 LAB — CBC
HCT: 35.4 % — ABNORMAL LOW (ref 36.0–46.0)
Hemoglobin: 11.9 g/dL — ABNORMAL LOW (ref 12.0–15.0)
MCH: 29.7 pg (ref 26.0–34.0)
MCHC: 33.6 g/dL (ref 30.0–36.0)
MCV: 88.3 fL (ref 78.0–100.0)
PLATELETS: 175 10*3/uL (ref 150–400)
RBC: 4.01 MIL/uL (ref 3.87–5.11)
RDW: 13 % (ref 11.5–15.5)
WBC: 8.8 10*3/uL (ref 4.0–10.5)

## 2013-08-24 MED ORDER — METHOCARBAMOL 500 MG PO TABS: 500.0000 mg | ORAL_TABLET | Freq: Four times a day (QID) | ORAL | Status: DC | PRN

## 2013-08-24 MED ORDER — RIVAROXABAN 10 MG PO TABS: 10.0000 mg | ORAL_TABLET | Freq: Every day | ORAL | Status: DC

## 2013-08-24 MED ORDER — OXYCODONE HCL 5 MG PO TABS: 5.0000 mg | ORAL_TABLET | ORAL | Status: DC | PRN

## 2013-08-24 NOTE — Progress Notes (Signed)
   Subjective: 3 Days Post-Op Procedure(s) (LRB): RIGHT TOTAL HIP ARTHROPLASTY ANTERIOR APPROACH (Right) Patient reports pain as mild.   Patient seen in rounds with Dr. Wynelle Cole. Patient is well, and has had no acute complaints or problems Patient is ready to go home  Objective: Vital signs in last 24 hours: Temp:  [98.8 F (37.1 C)-99 F (37.2 C)] 99 F (37.2 C) (08/08 0549) Pulse Rate:  [74-79] 79 (08/08 0549) Resp:  [16] 16 (08/08 0549) BP: (121-133)/(58-71) 133/62 mmHg (08/08 0549) SpO2:  [95 %-96 %] 95 % (08/08 0549)  Intake/Output from previous day:  Intake/Output Summary (Last 24 hours) at 08/24/13 0715 Last data filed at 08/24/13 0549  Gross per 24 hour  Intake    600 ml  Output   1400 ml  Net   -800 ml    Intake/Output this shift:    Labs:  Recent Labs  08/22/13 0445 08/23/13 0443 08/24/13 0554  HGB 11.6* 11.7* 11.9*    Recent Labs  08/23/13 0443 08/24/13 0554  WBC 11.5* 8.8  RBC 3.94 4.01  HCT 34.4* 35.4*  PLT 170 175    Recent Labs  08/22/13 0445 08/23/13 0443  NA 137 135*  K 4.0 4.2  CL 102 99  CO2 24 27  BUN 12 10  CREATININE 0.73 0.69  GLUCOSE 170* 139*  CALCIUM 8.5 8.8   No results found for this basename: LABPT, INR,  in the last 72 hours  EXAM: General - Patient is Alert, Appropriate and Oriented Extremity - Neurovascular intact Sensation intact distally Dorsiflexion/Plantar flexion intact Incision - clean, dry, no drainage Motor Function - intact, moving foot and toes well on exam.   Assessment/Plan: 3 Days Post-Op Procedure(s) (LRB): RIGHT TOTAL HIP ARTHROPLASTY ANTERIOR APPROACH (Right) Procedure(s) (LRB): RIGHT TOTAL HIP ARTHROPLASTY ANTERIOR APPROACH (Right) Past Medical History  Diagnosis Date  . Hypertension   . Hypothyroidism   . Dyslipidemia   . Osteopenia   . DJD (degenerative joint disease) of hip     right - Alusio  . Vitreous floaters of left eye 04/2013  . PONV (postoperative nausea and vomiting)     . Pneumonia     hx of pneumonia - lst time 1998  . Anxiety   . GERD (gastroesophageal reflux disease)   . Cancer     basal skin cancer    Principal Problem:   OA (osteoarthritis) of hip Active Problems:   Postoperative anemia due to acute blood loss   Hyponatremia  Estimated body mass index is 27.79 kg/(m^2) as calculated from the following:   Height as of this encounter: $RemoveBeforeD'5\' 4"'[id number]$  (1.626 m).   Weight as of this encounter: 73.483 kg (162 lb). Discharge home with home health Diet - Cardiac diet Follow up - in 2 weeks Activity - WBAT Disposition - Home Condition Upon Discharge - Good D/C Meds - See DC Summary DVT Prophylaxis - Xarelto  [name], PA-C Orthopaedic Surgery 08/24/2013, 7:15 AM

## 2013-08-24 NOTE — Discharge Summary (Signed)
Physician Discharge Summary   Patient ID: [name] MRN: [id number] DOB/AGE: 1950/05/19 63 y.o.  Admit date: 08/21/2013 Discharge date: 08/24/2013  Primary Diagnosis:  Osteoarthritis of the Right hip.   Admission Diagnoses:  Past Medical History  Diagnosis Date  . Hypertension   . Hypothyroidism   . Dyslipidemia   . Osteopenia   . DJD (degenerative joint disease) of hip     right - [place]us[place]  . Vitreous floaters of left eye 04/2013  . PONV (postoperative nausea and vomiting)   . Pneumonia     hx of pneumonia - lst time 1998  . Anxiety   . GERD (gastroesophageal reflux disease)   . Cancer     basal skin cancer    Discharge Diagnoses:   Principal Problem:   OA (osteoarthritis) of hip Active Problems:   Postoperative anemia due to acute blood loss   Hyponatremia  Estimated body mass index is 27.79 kg/(m^2) as calculated from the following:   Height as of this encounter: '5\' 4"'  (1.626 m).   Weight as of this encounter: 73.483 kg (162 lb).  Procedure(s) (LRB): RIGHT TOTAL HIP ARTHROPLASTY ANTERIOR APPROACH (Right)   Consults: None  HPI: [name] is a 63 y.o. female who has advanced end-  stage arthritis of his Right hip with progressively worsening pain and  dysfunction.The patient has failed nonoperative management and presents for  total hip arthroplasty.   Laboratory Data: Admission on 08/21/2013  Component Date Value Ref Range Status  . ABO/RH(D) 08/21/2013 O POS   Final  . Antibody Screen 08/21/2013 NEG   Final  . Sample Expiration 08/21/2013 08/24/2013   Final  . ABO/RH(D) 08/21/2013 O POS   Final  . WBC 08/22/2013 9.7  4.0 - 10.5 K/uL Final  . RBC 08/22/2013 3.94  3.87 - 5.11 MIL/uL Final  . Hemoglobin 08/22/2013 11.6* 12.0 - 15.0 g/dL Final  . HCT 08/22/2013 34.5* 36.0 - 46.0 % Final  . MCV 08/22/2013 87.6  78.0 - 100.0 fL Final  . MCH 08/22/2013 29.4  26.0 - 34.0 pg Final  . MCHC 08/22/2013 33.6  30.0 - 36.0 g/dL Final  . RDW 08/22/2013 12.6   11.5 - 15.5 % Final  . Platelets 08/22/2013 172  150 - 400 K/uL Final  . Sodium 08/22/2013 137  137 - 147 mEq/L Final  . Potassium 08/22/2013 4.0  3.7 - 5.3 mEq/L Final  . Chloride 08/22/2013 102  96 - 112 mEq/L Final  . CO2 08/22/2013 24  19 - 32 mEq/L Final  . Glucose, Bld 08/22/2013 170* 70 - 99 mg/dL Final  . BUN 08/22/2013 12  6 - 23 mg/dL Final  . Creatinine, Ser 08/22/2013 0.73  0.50 - 1.10 mg/dL Final  . Calcium 08/22/2013 8.5  8.4 - 10.5 mg/dL Final  . GFR calc non Af Amer 08/22/2013 88* >90 mL/min Final  . GFR calc Af Amer 08/22/2013 >90  >90 mL/min Final   Comment: (NOTE)                          The eGFR has been calculated using the CKD EPI equation.                          This calculation has not been validated in all clinical situations.  eGFR's persistently <90 mL/min signify possible Chronic Kidney                          Disease.  . Anion gap 08/22/2013 11  5 - 15 Final  . WBC 08/23/2013 11.5* 4.0 - 10.5 K/uL Final  . RBC 08/23/2013 3.94  3.87 - 5.11 MIL/uL Final  . Hemoglobin 08/23/2013 11.7* 12.0 - 15.0 g/dL Final  . HCT 08/23/2013 34.4* 36.0 - 46.0 % Final  . MCV 08/23/2013 87.3  78.0 - 100.0 fL Final  . MCH 08/23/2013 29.7  26.0 - 34.0 pg Final  . MCHC 08/23/2013 34.0  30.0 - 36.0 g/dL Final  . RDW 08/23/2013 12.7  11.5 - 15.5 % Final  . Platelets 08/23/2013 170  150 - 400 K/uL Final  . Sodium 08/23/2013 135* 137 - 147 mEq/L Final  . Potassium 08/23/2013 4.2  3.7 - 5.3 mEq/L Final  . Chloride 08/23/2013 99  96 - 112 mEq/L Final  . CO2 08/23/2013 27  19 - 32 mEq/L Final  . Glucose, Bld 08/23/2013 139* 70 - 99 mg/dL Final  . BUN 08/23/2013 10  6 - 23 mg/dL Final  . Creatinine, Ser 08/23/2013 0.69  0.50 - 1.10 mg/dL Final  . Calcium 08/23/2013 8.8  8.4 - 10.5 mg/dL Final  . GFR calc non Af Amer 08/23/2013 89* >90 mL/min Final  . GFR calc Af Amer 08/23/2013 >90  >90 mL/min Final   Comment: (NOTE)                          The eGFR has  been calculated using the CKD EPI equation.                          This calculation has not been validated in all clinical situations.                          eGFR's persistently <90 mL/min signify possible Chronic Kidney                          Disease.  . Anion gap 08/23/2013 9  5 - 15 Final  . WBC 08/24/2013 8.8  4.0 - 10.5 K/uL Final  . RBC 08/24/2013 4.01  3.87 - 5.11 MIL/uL Final  . Hemoglobin 08/24/2013 11.9* 12.0 - 15.0 g/dL Final  . HCT 08/24/2013 35.4* 36.0 - 46.0 % Final  . MCV 08/24/2013 88.3  78.0 - 100.0 fL Final  . MCH 08/24/2013 29.7  26.0 - 34.0 pg Final  . MCHC 08/24/2013 33.6  30.0 - 36.0 g/dL Final  . RDW 08/24/2013 13.0  11.5 - 15.5 % Final  . Platelets 08/24/2013 175  150 - 400 K/uL Final  Hospital Outpatient Visit on 08/14/2013  Component Date Value Ref Range Status  . aPTT 08/14/2013 38* 24 - 37 seconds Final   Comment:                                 IF BASELINE aPTT IS ELEVATED,                          SUGGEST PATIENT RISK ASSESSMENT  BE USED TO DETERMINE APPROPRIATE                          ANTICOAGULANT THERAPY.  . WBC 08/14/2013 5.8  4.0 - 10.5 K/uL Final  . RBC 08/14/2013 5.01  3.87 - 5.11 MIL/uL Final  . Hemoglobin 08/14/2013 15.1* 12.0 - 15.0 g/dL Final  . HCT 08/14/2013 43.4  36.0 - 46.0 % Final  . MCV 08/14/2013 86.6  78.0 - 100.0 fL Final  . MCH 08/14/2013 30.1  26.0 - 34.0 pg Final  . MCHC 08/14/2013 34.8  30.0 - 36.0 g/dL Final  . RDW 08/14/2013 12.6  11.5 - 15.5 % Final  . Platelets 08/14/2013 215  150 - 400 K/uL Final  . Sodium 08/14/2013 137  137 - 147 mEq/L Final  . Potassium 08/14/2013 4.3  3.7 - 5.3 mEq/L Final  . Chloride 08/14/2013 97  96 - 112 mEq/L Final  . CO2 08/14/2013 25  19 - 32 mEq/L Final  . Glucose, Bld 08/14/2013 106* 70 - 99 mg/dL Final  . BUN 08/14/2013 14  6 - 23 mg/dL Final  . Creatinine, Ser 08/14/2013 0.75  0.50 - 1.10 mg/dL Final  . Calcium 08/14/2013 10.1  8.4 - 10.5 mg/dL Final  .  Total Protein 08/14/2013 8.1  6.0 - 8.3 g/dL Final  . Albumin 08/14/2013 4.5  3.5 - 5.2 g/dL Final  . AST 08/14/2013 49* 0 - 37 U/L Final  . ALT 08/14/2013 58* 0 - 35 U/L Final  . Alkaline Phosphatase 08/14/2013 80  39 - 117 U/L Final  . Total Bilirubin 08/14/2013 0.8  0.3 - 1.2 mg/dL Final  . GFR calc non Af Amer 08/14/2013 87* >90 mL/min Final  . GFR calc Af Amer 08/14/2013 >90  >90 mL/min Final   Comment: (NOTE)                          The eGFR has been calculated using the CKD EPI equation.                          This calculation has not been validated in all clinical situations.                          eGFR's persistently <90 mL/min signify possible Chronic Kidney                          Disease.  . Anion gap 08/14/2013 15  5 - 15 Final  . Prothrombin Time 08/14/2013 13.7  11.6 - 15.2 seconds Final  . INR 08/14/2013 1.05  0.00 - 1.49 Final  . Color, Urine 08/14/2013 YELLOW  YELLOW Final  . APPearance 08/14/2013 CLEAR  CLEAR Final  . Specific Gravity, Urine 08/14/2013 1.008  1.005 - 1.030 Final  . pH 08/14/2013 7.0  5.0 - 8.0 Final  . Glucose, UA 08/14/2013 NEGATIVE  NEGATIVE mg/dL Final  . Hgb urine dipstick 08/14/2013 NEGATIVE  NEGATIVE Final  . Bilirubin Urine 08/14/2013 NEGATIVE  NEGATIVE Final  . Ketones, ur 08/14/2013 NEGATIVE  NEGATIVE mg/dL Final  . Protein, ur 08/14/2013 NEGATIVE  NEGATIVE mg/dL Final  . Urobilinogen, UA 08/14/2013 0.2  0.0 - 1.0 mg/dL Final  . Nitrite 08/14/2013 NEGATIVE  NEGATIVE Final  . Leukocytes, UA 08/14/2013 NEGATIVE  NEGATIVE Final   MICROSCOPIC NOT DONE ON  URINES WITH NEGATIVE PROTEIN, BLOOD, LEUKOCYTES, NITRITE, OR GLUCOSE <1000 mg/dL.  Marland Kitchen MRSA, PCR 08/14/2013 NEGATIVE  NEGATIVE Final  . Staphylococcus aureus 08/14/2013 NEGATIVE  NEGATIVE Final   Comment:                                 The Xpert SA Assay (FDA                          approved for NASAL specimens                          in patients over 60 years of age),                           is one component of                          a comprehensive surveillance                          program.  Test performance has                          been validated by [employer] for patients greater                          than or equal to 28 year old.                          It is not intended                          to diagnose infection nor to                          guide or monitor treatment.     X-Rays:Dg Pelvis Portable  08/21/2013   CLINICAL DATA:  Postop right hip  EXAM: DG C-ARM 1-60 MIN - NRPT [facility]; PORTABLE PELVIS 1-2 VIEWS  COMPARISON:  None.  FINDINGS: Right hip prosthesis appears well seated and aligned on this single AP view. There is no acute fracture or evidence of an operative complication.  IMPRESSION: Well-aligned right hip prosthesis.   Electronically Signed   By: Lajean Manes M.D.   On: 08/21/2013 15:52   Dg C-arm 1-60 Min-no Report  08/21/2013   CLINICAL DATA:  Postop right hip  EXAM: DG C-ARM 1-60 MIN - NRPT [facility]; PORTABLE PELVIS 1-2 VIEWS  COMPARISON:  None.  FINDINGS: Right hip prosthesis appears well seated and aligned on this single AP view. There is no acute fracture or evidence of an operative complication.  IMPRESSION: Well-aligned right hip prosthesis.   Electronically Signed   By: Lajean Manes M.D.   On: 08/21/2013 15:52    EKG: Orders placed in visit on 12/26/12  . EKG 12-LEAD  . EKG 12-LEAD     Hospital Course: Patient was admitted to [facility] and taken to the OR and underwent the above  state procedure without complications.  Patient tolerated the procedure well and was later transferred to the recovery room and then to the orthopaedic floor for postoperative care.  They were given PO and IV analgesics for pain control following their surgery.  They were given 24 hours of postoperative antibiotics of  Anti-infectives   Start     Dose/Rate Route Frequency Ordered Stop   08/22/13 0200  vancomycin  (VANCOCIN) IVPB 1000 mg/200 mL premix     1,000 mg 200 mL/hr over 60 Minutes Intravenous  Once 08/21/13 1656 08/22/13 0326   08/21/13 1036  vancomycin (VANCOCIN) IVPB 1000 mg/200 mL premix     1,000 mg 200 mL/hr over 60 Minutes Intravenous On call to O.R. 08/21/13 1036 08/21/13 1425     and started on DVT prophylaxis in the form of Xarelto.   PT and OT were ordered for total hip protocol.  The patient was allowed to be WBAT with therapy. Discharge planning was consulted to help with postop disposition and equipment needs.  Patient had a decent night on the evening of surgery.  They started to get up OOB with therapy on day one.  Hemovac drain was pulled without difficulty.  Continued to work with therapy into day two.  Dressing was changed on day two and the incision was healing well.  By day three, the patient had progressed with therapy and meeting their goals.  Incision was healing well.  Patient was seen in rounds and was ready to go home.  Discharge home with home health  Diet - Cardiac diet  Follow up - in 2 weeks  Activity - WBAT  Disposition - Home  Condition Upon Discharge - Good  D/C Meds - See DC Summary  DVT Prophylaxis - Xarelto   Discharge Instructions   Call MD / Call 911    Complete by:  As directed   If you experience chest pain or shortness of breath, CALL 911 and be transported to the hospital emergency room.  If you develope a fever above 101 F, pus (white drainage) or increased drainage or redness at the wound, or calf pain, call your surgeon's office.     Change dressing    Complete by:  As directed   You may change your dressing dressing daily with sterile 4 x 4 inch gauze dressing and paper tape.  Do not submerge the incision under water.     Constipation Prevention    Complete by:  As directed   Drink plenty of fluids.  Prune juice may be helpful.  You may use a stool softener, such as Colace (over the counter) 100 mg twice a day.  Use MiraLax (over the counter)  for constipation as needed.     Diet - low sodium heart healthy    Complete by:  As directed      Discharge instructions    Complete by:  As directed   Pick up stool softner and laxative for home. Do not submerge incision under water. May shower. Continue to use ice for pain and swelling from surgery.  Total Hip Protocol.  Take Xarelto for two and a half more weeks, then discontinue Xarelto. Once the patient has completed the Xarelto, they may resume the 81 mg Aspirin.     Do not sit on low chairs, stoools or toilet seats, as it may be difficult to get up from low surfaces    Complete by:  As directed      Driving restrictions  Complete by:  As directed   No driving until released by the physician.     Increase activity slowly as tolerated    Complete by:  As directed      Lifting restrictions    Complete by:  As directed   No lifting until released by the physician.     Patient may shower    Complete by:  As directed   You may shower without a dressing once there is no drainage.  Do not wash over the wound.  If drainage remains, do not shower until drainage stops.     TED hose    Complete by:  As directed   Use stockings (TED hose) for 3 weeks on both leg(s).  You may remove them at night for sleeping.     Weight bearing as tolerated    Complete by:  As directed             Medication List    STOP taking these medications       aspirin 81 MG tablet     Fish Oil 1000 MG Caps     multivitamin tablet     VAGIFEM 10 MCG Tabs vaginal tablet  Generic drug:  Estradiol      TAKE these medications       BENADRYL 25 MG tablet  Generic drug:  diphenhydrAMINE  Take 25 mg by mouth at bedtime as needed for sleep.     buPROPion 150 MG 12 hr tablet  Commonly known as:  WELLBUTRIN SR  Take 150 mg by mouth 3 (three) times a week.     hydroxypropyl methylcellulose 2.5 % ophthalmic solution  Commonly known as:  ISOPTO TEARS  Place 1 drop into both eyes 3 (three) times  daily as needed for dry eyes.     levothyroxine 25 MCG tablet  Commonly known as:  SYNTHROID, LEVOTHROID  Take 25 mcg by mouth every Monday, Wednesday, and Friday.     loperamide 2 MG tablet  Commonly known as:  IMODIUM A-D  Take 2 mg by mouth as needed for diarrhea or loose stools.     loteprednol 0.5 % ophthalmic suspension  Commonly known as:  LOTEMAX  Place 1 drop into both eyes 2 (two) times daily as needed (dry eyes).     methocarbamol 500 MG tablet  Commonly known as:  ROBAXIN  Take 1 tablet (500 mg total) by mouth every 6 (six) hours as needed for muscle spasms.     OVER THE COUNTER MEDICATION  Silversulfadi/Triamcinolone cream as needed for anal itching     OVER THE COUNTER MEDICATION  [employer] brand sugar free cough drops as needed     oxyCODONE 5 MG immediate release tablet  Commonly known as:  Oxy IR/ROXICODONE  Take 1-2 tablets (5-10 mg total) by mouth every 3 (three) hours as needed for breakthrough pain.     polyethylene glycol packet  Commonly known as:  MIRALAX / GLYCOLAX  Take 17 g by mouth as needed.     ranitidine 150 MG tablet  Commonly known as:  ZANTAC  Take 150 mg by mouth daily as needed for heartburn.     rivaroxaban 10 MG Tabs tablet  Commonly known as:  XARELTO  - Take 1 tablet (10 mg total) by mouth daily with breakfast. Take Xarelto for two and a half more weeks, then discontinue Xarelto.  - Once the patient has completed the Xarelto, they may resume the 81 mg Aspirin.  sodium chloride 0.65 % nasal spray  Commonly known as:  OCEAN  Place 1 spray into the nose 2 (two) times daily as needed for congestion.     triamterene-hydrochlorothiazide 37.5-25 MG per tablet  Commonly known as:  MAXZIDE-25  Take 1 tablet by mouth every morning.           Follow-up Information   Follow up with Gearlean Alf, MD. Schedule an appointment as soon as possible for a visit on 09/05/2013.   Specialty:  Orthopedic Surgery   Contact information:   [address] [place] [address] [phone]       Signed: [name], PA-C Orthopaedic Surgery 08/24/2013, 7:21 AM

## 2013-08-24 NOTE — Clinical Social Work Note (Signed)
CSW visited patient in room to confirm her plan to discharge home. Handoff received states [place] vs Home with HHPT. Patient states she will be going home today. Patient appeared to be happy to be returning home. CSW signing off at this time.    Liz Beach MSW, [place], [place], [phone]

## 2013-08-24 NOTE — Progress Notes (Signed)
Physical Therapy Treatment Patient Details Name: Roylene Heaton MRN: [id number] DOB: 02-Dec-1950 Today's Date: 08/24/2013    History of Present Illness Pt is s/p R direct anterior THA    PT Comments    Progressing well with increased confidence regarding return home.  Reviewed therex, car transfers and stairs.    Follow Up Recommendations  Home health PT;SNF     Equipment Recommendations  None recommended by PT    Recommendations for Other Services OT consult     Precautions / Restrictions Precautions Precautions: Fall Restrictions Weight Bearing Restrictions: No Other Position/Activity Restrictions: WBAT    Mobility  Bed Mobility Overal bed mobility: Modified Independent Bed Mobility: Supine to Sit     Supine to sit: Modified independent (Device/Increase time)     General bed mobility comments: pt states she retunred to bed this morning without assist  Transfers Overall transfer level: Modified independent Equipment used: Rolling walker (2 wheeled) Transfers: Sit to/from Stand Sit to Stand: Supervision         General transfer comment: vcs for RLE placement when sitting  Ambulation/Gait Ambulation/Gait assistance: Supervision Ambulation Distance (Feet): 160 Feet Assistive device: Rolling walker (2 wheeled) Gait Pattern/deviations: Step-to pattern;Step-through pattern;Decreased step length - right;Decreased step length - left;Shuffle;Trunk flexed     General Gait Details: Min cues for position from [employer] Stairs: Yes Stairs assistance: Min assist Stair Management: No rails;One rail Right;Forwards;Step to pattern;With walker;With crutches Number of Stairs: 5 General stair comments: cues for sequence and foot/cane/RW placement.  4 steps fwd with cane and one step fwd and bkwd with RW  Wheelchair Mobility    Modified Rankin (Stroke Patients Only)       Balance                                    Cognition Arousal/Alertness:  Awake/alert Behavior During Therapy: WFL for tasks assessed/performed Overall Cognitive Status: Within Functional Limits for tasks assessed                      Exercises Total Joint Exercises Ankle Circles/Pumps: AROM;Both;15 reps;Supine Quad Sets: AROM;Both;10 reps;Supine Gluteal Sets: AROM;Both;10 reps;Supine Heel Slides: AAROM;Right;Supine;20 reps Hip ABduction/ADduction: AAROM;Right;Supine;20 reps Long Arc Quad: AAROM;AROM;Right;15 reps;Seated    General Comments        Pertinent Vitals/Pain Pain Assessment: 0-10 Pain Score: 2  Pain Location: R hip Pain Descriptors / Indicators: Sore Pain Intervention(s): Repositioned    Home Living                      Prior Function            PT Goals (current goals can now be found in the care plan section) Acute Rehab PT Goals Patient Stated Goal: return to independence. PT Goal Formulation: With patient Time For Goal Achievement: 08/29/13 Potential to Achieve Goals: Good Progress towards PT goals: Progressing toward goals    Frequency  7X/week    PT Plan Current plan remains appropriate    Co-evaluation             End of Session Equipment Utilized During Treatment: Gait belt Activity Tolerance: Patient tolerated treatment well Patient left: in chair;with call bell/phone within reach     Time: 3536-1443 PT Time Calculation (min): 42 min  Charges:  $Gait Training: 8-22 mins $Therapeutic Exercise: 8-22 mins $Therapeutic Activity: 8-22 mins  G Codes:      Noa Constante 09/19/2013, 12:29 PM

## 2013-08-24 NOTE — Progress Notes (Signed)
Patient discharged by wheelchair with family.

## 2013-08-24 NOTE — Progress Notes (Signed)
Occupational Therapy Treatment Patient Details Name: Cheree Fowles MRN: [id number] DOB: Mar 25, 1950 Today's Date: 08/24/2013    History of present illness Pt is s/p R direct anterior THA   OT comments  Pt now plans home alone.  Making good progress.  Needs reinforcement with AE--mod cues given during this session.  Recommend continued HHOT  Follow Up Recommendations  Home health OT    Equipment Recommendations  None recommended by OT    Recommendations for Other Services      Precautions / Restrictions Precautions Precautions: Fall Restrictions Weight Bearing Restrictions: No Other Position/Activity Restrictions: WBAT       Mobility Bed Mobility                  Transfers   Equipment used: Rolling walker (2 wheeled) Transfers: Sit to/from Stand Sit to Stand: Supervision         General transfer comment: vcs for RLE placement when sitting    Balance                                   ADL       Grooming: Wash/dry hands;Wash/dry face;Oral care;Standing;Supervision/safety       Lower Body Bathing: Minimal assistance;Sit to/from stand       Lower Body Dressing: Minimal assistance;Sit to/from stand;With adaptive equipment   Toilet Transfer: Supervision/safety;BSC;Ambulation   Toileting- Water quality scientist and Hygiene: Supervision/safety;Sit to/from stand         General ADL Comments: Pt is planning home today and wanted to take a shower.  Practiced shower transfer at supervision level, cues for technique. Pt plans to buy AE kit.  She needed min A to wash bottom of foot.  She used AE to don pants, underwear, socks, and shoes.  Min A for R shoe as fit was a little tight.  Showed use of shoehorn, but there was not enough room for foot and shoehorn.        Vision                     Perception     Praxis      Cognition   Behavior During Therapy: WFL for tasks assessed/performed Overall Cognitive Status: Within  Functional Limits for tasks assessed                       Extremity/Trunk Assessment               Exercises     Shoulder Instructions       General Comments      Pertinent Vitals/ Pain       Pain Assessment: 0-10 Pain Score: 2  Pain Descriptors / Indicators: Sore Pain Intervention(s): Repositioned  Home Living                                          Prior Functioning/Environment              Frequency       Progress Toward Goals  OT Goals(current goals can now be found in the care plan section)  Progress towards OT goals: Progressing toward goals     Plan Discharge plan needs to be updated    Co-evaluation  End of Session     Activity Tolerance Patient tolerated treatment well   Patient Left in chair;with call bell/phone within reach   Nurse Communication          Time: 1696-7893 OT Time Calculation (min): 48 min  Charges: OT General Charges $OT Visit: 1 Procedure OT Treatments $Self Care/Home Management : 38-52 mins  Gemini Beaumier 08/24/2013, 10:44 AM  Lesle Chris, OTR/L [phone] 08/24/2013

## 2013-08-25 NOTE — Progress Notes (Signed)
08/25/2013 1700 NCM spoke to [facility] and they will be following for [facility] PT. Soc 8/9. Jonnie Finner RN CCM Case Mgmt phone [phone]

## 2013-11-26 ENCOUNTER — Other Ambulatory Visit: Payer: Self-pay | Admitting: Internal Medicine

## 2014-01-12 ENCOUNTER — Other Ambulatory Visit: Payer: Self-pay | Admitting: Internal Medicine

## 2014-01-21 ENCOUNTER — Other Ambulatory Visit: Payer: Self-pay | Admitting: Internal Medicine

## 2014-02-12 ENCOUNTER — Ambulatory Visit (INDEPENDENT_AMBULATORY_CARE_PROVIDER_SITE_OTHER)
Admission: RE | Admit: 2014-02-12 | Discharge: 2014-02-12 | Disposition: A | Discharge status: Home / Self Care | Payer: Medicare Other | Source: Ambulatory Visit | Attending: Internal Medicine | Admitting: Internal Medicine

## 2014-02-12 ENCOUNTER — Other Ambulatory Visit (INDEPENDENT_AMBULATORY_CARE_PROVIDER_SITE_OTHER): Payer: Medicare Other

## 2014-02-12 ENCOUNTER — Ambulatory Visit (INDEPENDENT_AMBULATORY_CARE_PROVIDER_SITE_OTHER): Payer: Medicare Other | Admitting: Internal Medicine

## 2014-02-12 ENCOUNTER — Encounter: Payer: Self-pay | Admitting: Internal Medicine

## 2014-02-12 VITALS — BP 128/84 | HR 74 | Ht 64.5 in | Wt 160.0 lb

## 2014-02-12 DIAGNOSIS — I10 Essential (primary) hypertension: Secondary | ICD-10-CM

## 2014-02-12 DIAGNOSIS — E785 Hyperlipidemia, unspecified: Secondary | ICD-10-CM

## 2014-02-12 DIAGNOSIS — E039 Hypothyroidism, unspecified: Secondary | ICD-10-CM

## 2014-02-12 DIAGNOSIS — M8580 Other specified disorders of bone density and structure, unspecified site: Secondary | ICD-10-CM

## 2014-02-12 DIAGNOSIS — Z23 Encounter for immunization: Secondary | ICD-10-CM

## 2014-02-12 LAB — HEPATIC FUNCTION PANEL
ALT: 32 U/L (ref 0–35)
AST: 34 U/L (ref 0–37)
Albumin: 4.6 g/dL (ref 3.5–5.2)
Alkaline Phosphatase: 77 U/L (ref 39–117)
Bilirubin, Direct: 0.1 mg/dL (ref 0.0–0.3)
TOTAL PROTEIN: 7.7 g/dL (ref 6.0–8.3)
Total Bilirubin: 1 mg/dL (ref 0.2–1.2)

## 2014-02-12 LAB — LIPID PANEL
CHOLESTEROL: 268 mg/dL — AB (ref 0–200)
HDL: 66.2 mg/dL (ref >39.00)
NonHDL: 201.8
TRIGLYCERIDES: 218 mg/dL — AB (ref 0.0–149.0)
Total CHOL/HDL Ratio: 4
VLDL: 43.6 mg/dL — AB (ref 0.0–40.0)

## 2014-02-12 LAB — TSH: TSH: 3.57 u[IU]/mL (ref 0.35–4.50)

## 2014-02-12 LAB — LDL CHOLESTEROL, DIRECT: LDL DIRECT: 181 mg/dL

## 2014-02-12 NOTE — Progress Notes (Signed)
Quick Note:  Spoke with pt and notified of results per Dr. Wert. Pt verbalized understanding and denied any questions.  ______ 

## 2014-02-12 NOTE — Progress Notes (Signed)
Subjective:    Patient ID: [name], female    DOB: October 10, 1950    MRN: [id number]  HPI   71 yowf quit smoking 2003 with h/o HBP and ? rml syndrome.  June 26, 2008 cpx only c/o = hoarseness and indigestion off and on for up to sev years, worse for several months, better with tums and not eating as much late in evening. no cough or sob over baseline rec trial of diet only to reduce likelihood of gerd    11/08/2010 f/u ov/Dominie Benedick CPX  No new complaints rec Ok to try reducing the wellbutrin to every other day for a month and then stop           02/12/2014 f/u ov/Farrell Broerman re: hbp/ hypothyroidism/ rml syndrome  Chief Complaint  Patient presents with  . Annual Exam    Pt fasting. She is feeling well and denies any co's today.    Not limited by breathing from desired activities    No tia, claudication, ex cp or sob/ cough  Sleeping ok without nocturnal  or early am exacerbation  of respiratory  c/o's. Also denies any obvious fluctuation of symptoms with weather or environmental changes or other aggravating or alleviating factors except as outlined above   ROS  The following are not active complaints unless bolded sore throat, dysphagia, dental problems, itching, sneezing,  Variable nasal congestion or excess secretions, ear ache,   fever, chills, sweats, unintended wt loss, pleuritic or exertional cp, hemoptysis,  orthopnea pnd or leg swelling, presyncope, palpitations, heartburn, abdominal pain, anorexia, nausea, vomiting, diarrhea  or change in bowel or urinary habits, change in stools or urine, dysuria,hematuria,  rash, arthralgias R Hip, visual complaints, headache, numbness weakness or ataxia or problems with walking or coordination,  change in mood/affect or memory.         Allergies  1) ! Pcn     Past Medical History:  HYPERTENSION (ICD-401.9)  HYPOTHYROIDISM (ICD-244.9)  DYSLIPIDEMIA (ICD-272.4)  - Taret ldl < 130 pos fm hx, h/o smoking, hbp  OSTEOPENIA (ICD-733.90)  -  DEXA 09/20/06  T spine-.7, Left Fem Neck -1.9, Right Fem neck -1.1 > declined f/u study August 18, 2009  RML Syndorme  - FOB 09/18/1996  DJD R Hip..............................................................................................Marland KitchenMarland KitchenAlusio  HEALTH MAINTENANCE.........................................................................Marland KitchenWert  - GYN = Grubb PA  - Colonoscopy neg 04/23/2004  - Td 06/2008  - Pneumovax 07/2004  second one, prevnar 13 02/12/2014  - CPX  02/12/2014     Family History:  allergies in mother  lymphoma in mother  asthma in brother 82 years older only sibling  colon ca brother  Prostate ca same brother neg cva/aneurysms  IHD Father onset 70s   Social History:  Quit smokng Jan 2003   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           Objective:   Physical Exam   wt 158 June 26, 2008 > 163 August 18, 2009 > 162 11/08/2010 > 03/16/2011  165 > 11/30/2011  159 > 12/26/2012 164 > 02/12/2014 160  in general this is a very pleasant healthy alert ambulatory white female in no acute distress     HEENT: nl dentition, turbinates, and orophanx. Nl external ear canals without cough reflex  Neck without JVD/Nodes/TM  Lungs clear to A and P bilaterally without cough on insp or exp maneuvers  RRR no s3 or murmur or increase in P2  Abd soft and non tender  Ext warm without calf tenderness, cyanosis clubbing or edema  Skin warm and  dry without lesions  MS   nl gait. No major joint restrictions  Neuro alert, approp, no motor or cerebellar def or pathologic reflexes  Rectal/ gyn:  Per Waldemar Dickens      CXR:  02/12/14 I personally reviewed images and agree with radiology impression as follows:    Stable changes of COPD and prominent interstitial markings consistent with the patient's smoking history. There is no active cardiopulmonary disease.   Lab Results  Component Value Date   TSH 3.57 02/12/2014          Chemistry      Component Value Date/Time   NA 135* 08/23/2013 0443   K  4.2 08/23/2013 0443   CL 99 08/23/2013 0443   CO2 27 08/23/2013 0443   BUN 10 08/23/2013 0443   CREATININE 0.69 08/23/2013 0443      Component Value Date/Time   CALCIUM 8.8 08/23/2013 0443   ALKPHOS 77 02/12/2014 0935   AST 34 02/12/2014 0935   ALT 32 02/12/2014 0935   BILITOT 1.0 02/12/2014 0935       Lab Results  Component Value Date   WBC 8.8 08/24/2013   HGB 11.9* 08/24/2013   HCT 35.4* 08/24/2013   MCV 88.3 08/24/2013   PLT 175 08/24/2013    Assessment & Plan:

## 2014-02-12 NOTE — Progress Notes (Signed)
Quick Note:  LMTCB ______ 

## 2014-02-12 NOTE — Patient Instructions (Signed)
prevnar 13 is the last pneumonia shot you'll need - if have any reaction ice the location and take benadryl up to 50 mg every 4 hours and call if needed  Please remember to go to the lab and x-ray department downstairs for your tests - we will call you with the results when they are available.

## 2014-02-16 ENCOUNTER — Encounter: Payer: Self-pay | Admitting: Internal Medicine

## 2014-02-16 NOTE — Assessment & Plan Note (Signed)
-   DEXA 08/06/2004   - 1.25  PA Spine  Vs  -1.96  L Hip  and - 2.1 R Hip    -  DEXA 09/20/06  T spine-.7, Left Fem Neck -1.9, Right Fem neck -1.1 > declined f/u study August 18, 2009 and 02/12/14

## 2014-02-16 NOTE — Assessment & Plan Note (Signed)
Target ldl < 130 pos fm hx, h/o smoking, hbp  Lab Results  Component Value Date   CHOL 268* 02/12/2014   HDL 66.20 02/12/2014   LDLDIRECT 181.0 02/12/2014   TRIG 218.0* 02/12/2014   CHOLHDL 4 02/12/2014     LDL well above target though hdl impressively elevated > rec diet/ ex and return w/in 6 m to consider statin

## 2014-02-16 NOTE — Assessment & Plan Note (Signed)
Adequate control on present rx, reviewed > no change in rx needed  = maxzide 25 daily

## 2014-02-16 NOTE — Assessment & Plan Note (Signed)
Adequate control on present rx, reviewed > no change in rx needed  = synthroid 25 mcg daily

## 2014-02-25 ENCOUNTER — Other Ambulatory Visit: Payer: Self-pay | Admitting: Internal Medicine

## 2014-03-26 ENCOUNTER — Other Ambulatory Visit: Payer: Self-pay | Admitting: Nurse Practitioner

## 2014-03-26 DIAGNOSIS — Z1231 Encounter for screening mammogram for malignant neoplasm of breast: Secondary | ICD-10-CM

## 2014-04-28 ENCOUNTER — Ambulatory Visit (HOSPITAL_COMMUNITY)
Admission: RE | Admit: 2014-04-28 | Discharge: 2014-04-28 | Disposition: A | Discharge status: Home / Self Care | Payer: Medicare Other | Source: Ambulatory Visit | Attending: Nurse Practitioner | Admitting: Nurse Practitioner

## 2014-04-28 ENCOUNTER — Other Ambulatory Visit: Payer: Self-pay | Admitting: Nurse Practitioner

## 2014-04-28 DIAGNOSIS — Z1231 Encounter for screening mammogram for malignant neoplasm of breast: Secondary | ICD-10-CM

## 2014-05-02 ENCOUNTER — Ambulatory Visit: Payer: Medicare Other | Admitting: Nurse Practitioner

## 2014-05-22 ENCOUNTER — Ambulatory Visit: Payer: Medicare Other | Admitting: Nurse Practitioner

## 2014-05-27 ENCOUNTER — Encounter: Payer: Self-pay | Admitting: Internal Medicine

## 2014-06-05 ENCOUNTER — Encounter: Payer: Self-pay | Admitting: *Deleted

## 2014-07-24 ENCOUNTER — Other Ambulatory Visit: Payer: Self-pay | Admitting: Internal Medicine

## 2014-07-24 ENCOUNTER — Telehealth: Payer: Self-pay | Admitting: Internal Medicine

## 2014-07-24 NOTE — Telephone Encounter (Signed)
lmtcb x1 for pt. 

## 2014-07-25 NOTE — Telephone Encounter (Signed)
Patient received call from our office from our recall file to schedule appointment for hyperlipidemia.  Patient says that she thinks this appointment would be a waste of time because she is not going to go on any medications for this.  She said that she will come in if Dr. Melvyn Novas wants her too, but she would prefer to wait and come back in January for her yearly exam.  Dr. Melvyn Novas, please advise.

## 2014-07-25 NOTE — Telephone Encounter (Signed)
Pt is aware. Nothing further needed 

## 2014-07-25 NOTE — Telephone Encounter (Signed)
Pt returned call  [phone]

## 2014-07-25 NOTE — Telephone Encounter (Signed)
That's fine - she's aware of risks

## 2014-07-25 NOTE — Telephone Encounter (Signed)
lmomtcb x1 

## 2014-07-25 NOTE — Telephone Encounter (Signed)
Pt cb, [phone]

## 2014-07-30 ENCOUNTER — Ambulatory Visit (INDEPENDENT_AMBULATORY_CARE_PROVIDER_SITE_OTHER): Payer: Medicare Other | Admitting: Nurse Practitioner

## 2014-07-30 ENCOUNTER — Encounter: Payer: Self-pay | Admitting: Nurse Practitioner

## 2014-07-30 VITALS — BP 124/70 | HR 76 | Ht 64.75 in | Wt 162.0 lb

## 2014-07-30 DIAGNOSIS — E2839 Other primary ovarian failure: Secondary | ICD-10-CM

## 2014-07-30 DIAGNOSIS — Z0000 Encounter for general adult medical examination without abnormal findings: Secondary | ICD-10-CM

## 2014-07-30 DIAGNOSIS — Z124 Encounter for screening for malignant neoplasm of cervix: Secondary | ICD-10-CM

## 2014-07-30 DIAGNOSIS — Z01419 Encounter for gynecological examination (general) (routine) without abnormal findings: Secondary | ICD-10-CM | POA: Diagnosis not present

## 2014-07-30 NOTE — Progress Notes (Signed)
Patient ID: [name], female   DOB: [birth year], 64 y.o.   MRN: [id number] 64 y.o. G0P0 Single  Caucasian Fe here for annual exam.  No vaginal bleeding or spotting. She did have right hip replacement last 08/2013.  She followed instructions and did stay off Vagifem before and during recuperation.  She never did restart and feels OK without medications.  She is not SA or dating.  She is now back to riding her house and doing most things  Patient's last menstrual period was 01/17/2001 (approximate).          Sexually active: No.  The current method of family planning is none.    Exercising: Yes.    walking 5 times per week when not so hot outside Smoker:  no  Health Maintenance: Pap:  10/15/09, Negative  MMG:  04/28/14, Bi-Rads 1: Negative Colonoscopy:  04/23/04, repeat in 10 years; IFOB 04/2013 not returned BMD:  07/2004, -1.25/-1.98 TDaP:  06/2008 Labs:  01/2014 in EPIC   reports that she quit smoking about 13 years ago. Her smoking use included Cigarettes. She has a 10 pack-year smoking history. She has never used smokeless tobacco. She reports that she drinks about 6.6 oz of alcohol per week. She reports that she does not use illicit drugs.  Past Medical History  Diagnosis Date  . Hypertension   . Hypothyroidism   . Dyslipidemia   . Osteopenia   . DJD (degenerative joint disease) of hip     right - Alusio  . Vitreous floaters of left eye 04/2013  . PONV (postoperative nausea and vomiting)   . Pneumonia     hx of pneumonia - lst time 1998  . Anxiety   . GERD (gastroesophageal reflux disease)   . Cancer     basal skin cancer     Past Surgical History  Procedure Laterality Date  . Tonsillectomy and adenoidectomy  child age 39  . Flexible bronchoscopy  1998    for pneumonia recurrent  . Total hip arthroplasty Right 08/21/2013    Procedure: RIGHT TOTAL HIP ARTHROPLASTY ANTERIOR APPROACH;  Surgeon: Gearlean Alf, MD;  Location: [facility];  Service: Orthopedics;  Laterality: Right;     Current Outpatient Prescriptions  Medication Sig Dispense Refill  . aspirin 81 MG tablet Take 81 mg by mouth daily.    Marland Kitchen buPROPion (WELLBUTRIN SR) 150 MG 12 hr tablet Take 150 mg by mouth 3 (three) times a week.    . clindamycin (CLEOCIN) 300 MG capsule As directed prior to dental appts  1  . diphenhydrAMINE (BENADRYL) 25 MG tablet Take 25 mg by mouth at bedtime as needed for sleep.     . hydroxypropyl methylcellulose (ISOPTO TEARS) 2.5 % ophthalmic solution Place 1 drop into both eyes 3 (three) times daily as needed for dry eyes.    Marland Kitchen levothyroxine (SYNTHROID, LEVOTHROID) 25 MCG tablet Take 25 mcg by mouth every Monday, Wednesday, and Friday.    . loperamide (IMODIUM A-D) 2 MG tablet Take 2 mg by mouth as needed for diarrhea or loose stools.    Marland Kitchen loteprednol (LOTEMAX) 0.5 % ophthalmic suspension Place 1 drop into both eyes 2 (two) times daily as needed (dry eyes).    . Multiple Vitamin (MULTIVITAMIN) capsule Take 1 capsule by mouth every other day.    Marland Kitchen OVER THE COUNTER MEDICATION Silversulfadi/Triamcinolone cream as needed for anal itching    . OVER THE COUNTER MEDICATION [employer] brand sugar free cough drops as needed    .  ranitidine (ZANTAC) 150 MG tablet Take 150 mg by mouth daily as needed for heartburn.     . sodium chloride (OCEAN) 0.65 % nasal spray Place 1 spray into the nose 2 (two) times daily as needed for congestion.     . triamterene-hydrochlorothiazide (MAXZIDE-25) 37.5-25 MG per tablet TAKE 1 TABLET BY MOUTH EVERY DAY 30 tablet 11   No current facility-administered medications for this visit.    Family History  Problem Relation Age of Onset  . Allergies Mother   . Lymphoma Mother   . Asthma Brother   . Cancer Brother   . Colon cancer Brother 47  . Other Father     IHD    ROS:  Pertinent items are noted in HPI.  Otherwise, a comprehensive ROS was negative.  Exam:   BP 124/70 mmHg  Pulse 76  Ht 5' 4.75" (1.645 m)  Wt 162 lb (73.483 kg)  BMI 27.16 kg/m2  LMP  01/17/2001 (Approximate) Height: 5' 4.75" (164.5 cm) Ht Readings from Last 3 Encounters:  07/30/14 5' 4.75" (1.645 m)  02/12/14 5' 4.5" (1.638 m)  08/21/13 '5\' 4"'  (1.626 m)    General appearance: alert, cooperative and appears stated age Head: Normocephalic, without obvious abnormality, atraumatic Neck: no adenopathy, supple, symmetrical, trachea midline and thyroid normal to inspection and palpation Lungs: clear to auscultation bilaterally Breasts: normal appearance, no masses or tenderness Heart: regular rate and rhythm Abdomen: soft, non-tender; no masses,  no organomegaly Extremities: extremities normal, atraumatic, no cyanosis or edema Skin: Skin color, texture, turgor normal. No rashes or lesions Lymph nodes: Cervical, supraclavicular, and axillary nodes normal. No abnormal inguinal nodes palpated Neurologic: Grossly normal   Pelvic: External genitalia:  no lesions              Urethra:  normal appearing urethra with no masses, tenderness or lesions              Bartholin's and Skene's: normal                 Vagina: normal appearing vagina with normal color and discharge, no lesions              Cervix: anteverted              Pap taken: Yes.   Bimanual Exam:  Uterus:  normal size, contour, position, consistency, mobility, non-tender              Adnexa: no mass, fullness, tenderness               Rectovaginal: Confirms               Anus:  normal sphincter tone, no lesions  Chaperone present:  yes  A:  Well Woman with normal exam  Postmenopausal Atrophic vaginitis on Vagifem until 07/2013 Right hip pain secondary to arthritis with Right hip replacement 08/21/2013 Situational depression   P:   Reviewed health and wellness pertinent to exam  Pap smear as above  Mammogram is due 04/2015  She will get BMD done later this year  Counseled on breast self exam, mammography screening, adequate intake of calcium and vitamin D, diet and  exercise, Kegel's exercises return annually or prn  An After Visit Summary was printed and given to the patient.

## 2014-07-30 NOTE — Patient Instructions (Signed)

## 2014-07-31 LAB — IPS PAP SMEAR ONLY

## 2014-08-01 NOTE — Progress Notes (Signed)
Encounter reviewed by Dr. Victormanuel Mclure Amundson C. Silva.  

## 2014-08-18 ENCOUNTER — Other Ambulatory Visit: Payer: Self-pay | Admitting: Nurse Practitioner

## 2014-08-19 MED ORDER — NONFORMULARY OR COMPOUNDED ITEM
Status: DC
Start: 2014-08-19 — End: 2016-10-21

## 2014-08-19 NOTE — Telephone Encounter (Signed)
Rx printed signed and faxed to [facility].

## 2014-08-19 NOTE — Telephone Encounter (Signed)
Medication refill request: Triamcinolone 0.1% Last AEX:  07/30/14 with PG  Next AEX: 08/04/15 with PG Last MMG (if hormonal medication request): n/a Refill authorized: Please advise.

## 2014-08-21 ENCOUNTER — Other Ambulatory Visit: Payer: Self-pay | Admitting: Nurse Practitioner

## 2014-08-21 NOTE — Telephone Encounter (Signed)
I would suggest aveeno eczema cream she can buy OTC it works well for this.

## 2014-08-21 NOTE — Telephone Encounter (Signed)
Medication refill request: [email or web address]  Last AEX:  07/30/14 PG Next AEX: 08/04/15 PG Last MMG (if hormonal medication request): 04/29/14 [id number]:Neg Refill authorized: please advise.  Routed to DL

## 2014-08-21 NOTE — Telephone Encounter (Signed)
Patient needs a call . Let her know she should not be using medication daily.  If she is using twice weekly this is ok, but too frequently can cause thinning of the skin or irritation from medication. Ok to refill

## 2014-08-21 NOTE — Telephone Encounter (Signed)
Patient notified. Verbalized understanding and very appreciative for suggestions.

## 2014-08-21 NOTE — Telephone Encounter (Signed)
Patient has refill pending and called to check status and request a call from a nurse. Patient states she is using a container per month and is unsure if this is too frequently. Patient states she uses it only as needed and was unaware it had cortizone in it. States she would like to speak with a nurse regarding this prior to refill being completed.  [phone]

## 2014-08-21 NOTE — Telephone Encounter (Signed)
Patient for status of refill request triamcinolone. Pharmacy told her they have sent request with no response from our office.

## 2014-08-21 NOTE — Telephone Encounter (Signed)
Patient states she has been using it everyday but will start using it twice a week from now on.   Sometimes "aquaform" used for diaper rash in babies. Is there any other suggestions for what she can use every day? Patient uses Kenalog for anal itching and sometimes it flares up worse than others. Please advise.

## 2014-08-25 ENCOUNTER — Ambulatory Visit: Payer: Medicare Other | Admitting: Internal Medicine

## 2014-10-06 ENCOUNTER — Encounter: Payer: Self-pay | Admitting: Internal Medicine

## 2015-01-22 ENCOUNTER — Other Ambulatory Visit: Payer: Self-pay | Admitting: Internal Medicine

## 2015-01-22 MED ORDER — TRIAMTERENE-HCTZ 37.5-25 MG PO TABS: 1.0000 | ORAL_TABLET | Freq: Every day | ORAL | Status: DC

## 2015-02-24 DIAGNOSIS — L821 Other seborrheic keratosis: Secondary | ICD-10-CM | POA: Diagnosis not present

## 2015-02-24 DIAGNOSIS — Z85828 Personal history of other malignant neoplasm of skin: Secondary | ICD-10-CM | POA: Diagnosis not present

## 2015-02-24 DIAGNOSIS — L720 Epidermal cyst: Secondary | ICD-10-CM | POA: Diagnosis not present

## 2015-02-24 DIAGNOSIS — D2239 Melanocytic nevi of other parts of face: Secondary | ICD-10-CM | POA: Diagnosis not present

## 2015-02-24 DIAGNOSIS — D1801 Hemangioma of skin and subcutaneous tissue: Secondary | ICD-10-CM | POA: Diagnosis not present

## 2015-03-31 DIAGNOSIS — Z85828 Personal history of other malignant neoplasm of skin: Secondary | ICD-10-CM | POA: Diagnosis not present

## 2015-03-31 DIAGNOSIS — D2211 Melanocytic nevi of right eyelid, including canthus: Secondary | ICD-10-CM | POA: Diagnosis not present

## 2015-03-31 DIAGNOSIS — D485 Neoplasm of uncertain behavior of skin: Secondary | ICD-10-CM | POA: Diagnosis not present

## 2015-03-31 DIAGNOSIS — L570 Actinic keratosis: Secondary | ICD-10-CM | POA: Diagnosis not present

## 2015-04-03 ENCOUNTER — Ambulatory Visit: Payer: Medicare Other | Admitting: Internal Medicine

## 2015-05-08 ENCOUNTER — Other Ambulatory Visit (INDEPENDENT_AMBULATORY_CARE_PROVIDER_SITE_OTHER): Payer: Medicare Other

## 2015-05-08 ENCOUNTER — Ambulatory Visit (INDEPENDENT_AMBULATORY_CARE_PROVIDER_SITE_OTHER)
Admission: RE | Admit: 2015-05-08 | Discharge: 2015-05-08 | Disposition: A | Discharge status: Home / Self Care | Payer: Medicare Other | Source: Ambulatory Visit | Attending: Internal Medicine | Admitting: Internal Medicine

## 2015-05-08 ENCOUNTER — Encounter: Payer: Self-pay | Admitting: Internal Medicine

## 2015-05-08 ENCOUNTER — Ambulatory Visit (INDEPENDENT_AMBULATORY_CARE_PROVIDER_SITE_OTHER): Payer: Medicare Other | Admitting: Internal Medicine

## 2015-05-08 VITALS — BP 128/70 | HR 69 | Ht 64.5 in | Wt 160.0 lb

## 2015-05-08 DIAGNOSIS — E785 Hyperlipidemia, unspecified: Secondary | ICD-10-CM | POA: Diagnosis not present

## 2015-05-08 DIAGNOSIS — E039 Hypothyroidism, unspecified: Secondary | ICD-10-CM

## 2015-05-08 DIAGNOSIS — I10 Essential (primary) hypertension: Secondary | ICD-10-CM | POA: Diagnosis not present

## 2015-05-08 DIAGNOSIS — Z0000 Encounter for general adult medical examination without abnormal findings: Secondary | ICD-10-CM | POA: Diagnosis not present

## 2015-05-08 DIAGNOSIS — D62 Acute posthemorrhagic anemia: Secondary | ICD-10-CM | POA: Diagnosis not present

## 2015-05-08 DIAGNOSIS — Z8371 Family history of colonic polyps: Secondary | ICD-10-CM

## 2015-05-08 DIAGNOSIS — J449 Chronic obstructive pulmonary disease, unspecified: Secondary | ICD-10-CM | POA: Diagnosis not present

## 2015-05-08 LAB — CBC WITH DIFFERENTIAL/PLATELET
BASOS PCT: 0.4 % (ref 0.0–3.0)
Basophils Absolute: 0 10*3/uL (ref 0.0–0.1)
EOS ABS: 0.1 10*3/uL (ref 0.0–0.7)
Eosinophils Relative: 1.7 % (ref 0.0–5.0)
HCT: 37.4 % (ref 36.0–46.0)
HEMOGLOBIN: 13 g/dL (ref 12.0–15.0)
Lymphocytes Relative: 30.3 % (ref 12.0–46.0)
Lymphs Abs: 1.6 10*3/uL (ref 0.7–4.0)
MCHC: 34.7 g/dL (ref 30.0–36.0)
MCV: 87.4 fl (ref 78.0–100.0)
MONO ABS: 0.6 10*3/uL (ref 0.1–1.0)
Monocytes Relative: 11.2 % (ref 3.0–12.0)
Neutro Abs: 3 10*3/uL (ref 1.4–7.7)
Neutrophils Relative %: 56.4 % (ref 43.0–77.0)
Platelets: 218 10*3/uL (ref 150.0–400.0)
RBC: 4.28 Mil/uL (ref 3.87–5.11)
RDW: 13.1 % (ref 11.5–15.5)
WBC: 5.3 10*3/uL (ref 4.0–10.5)

## 2015-05-08 LAB — BASIC METABOLIC PANEL
BUN: 15 mg/dL (ref 6–23)
CO2: 29 mEq/L (ref 19–32)
Calcium: 9.8 mg/dL (ref 8.4–10.5)
Chloride: 100 mEq/L (ref 96–112)
Creatinine, Ser: 0.88 mg/dL (ref 0.40–1.20)
GFR: 68.12 mL/min (ref >60.00)
GLUCOSE: 117 mg/dL — AB (ref 70–99)
POTASSIUM: 4 meq/L (ref 3.5–5.1)
SODIUM: 139 meq/L (ref 135–145)

## 2015-05-08 LAB — URINALYSIS
BILIRUBIN URINE: NEGATIVE
HGB URINE DIPSTICK: NEGATIVE
Ketones, ur: NEGATIVE
LEUKOCYTES UA: NEGATIVE
NITRITE: NEGATIVE
PH: 5.5 (ref 5.0–8.0)
Specific Gravity, Urine: 1.015 (ref 1.000–1.030)
Total Protein, Urine: NEGATIVE
Urine Glucose: NEGATIVE
Urobilinogen, UA: 0.2 (ref 0.0–1.0)

## 2015-05-08 LAB — HEPATIC FUNCTION PANEL
ALBUMIN: 4.6 g/dL (ref 3.5–5.2)
ALT: 68 U/L — AB (ref 0–35)
AST: 61 U/L — AB (ref 0–37)
Alkaline Phosphatase: 69 U/L (ref 39–117)
Bilirubin, Direct: 0.1 mg/dL (ref 0.0–0.3)
Total Bilirubin: 0.8 mg/dL (ref 0.2–1.2)
Total Protein: 7.5 g/dL (ref 6.0–8.3)

## 2015-05-08 LAB — LIPID PANEL
CHOLESTEROL: 252 mg/dL — AB (ref 0–200)
HDL: 54.4 mg/dL (ref >39.00)
LDL Cholesterol: 166 mg/dL — ABNORMAL HIGH (ref 0–99)
NonHDL: 197.49
TRIGLYCERIDES: 158 mg/dL — AB (ref 0.0–149.0)
Total CHOL/HDL Ratio: 5
VLDL: 31.6 mg/dL (ref 0.0–40.0)

## 2015-05-08 LAB — TSH: TSH: 3.15 u[IU]/mL (ref 0.35–4.50)

## 2015-05-08 NOTE — Progress Notes (Signed)
Quick Note:  LMOM with results ______ 

## 2015-05-08 NOTE — Patient Instructions (Addendum)
Please remember to go to the lab and x-ray department downstairs for your tests - we will call you with the results when they are available.  Weight control is simply a matter of calorie balance which needs to be tilted in your favor by eating less and exercising more.  To get the most out of exercise, you need to be continuously aware that you are short of breath, but never out of breath, for 30 minutes daily. As you improve, it will actually be easier for you to do the same amount of exercise  in  30 minutes so always push to the level where you are short of breath.  If this does not result in gradual weight reduction then I strongly recommend you see a nutritionist with a food diary x 2 weeks so that we can work out a negative calorie balance which is universally effective in steady weight loss programs.  Think of your calorie balance like you do your bank account where in this case you want the balance to go down so you must take in less calories than you burn up.  It's just that simple:  Hard to do, but easy to understand.  Good luck!   Return in one year for cpx - call sooner if needed

## 2015-05-08 NOTE — Progress Notes (Signed)
Subjective:    Patient ID: [name], female    DOB: [birth year]    MRN: [id number]     Brief patient profile:  56 yowf quit smoking 2003 with h/o HBP and ? rml syndrome.   History of Present Illness  June 26, 2008 cpx only c/o = hoarseness and indigestion off and on for up to sev years, worse for several months, better with tums and not eating as much late in evening. no cough or sob over baseline rec trial of diet only to reduce likelihood of gerd   11/08/2010 f/u ov/Meghen Akopyan CPX  No new complaints rec Ok to try reducing the wellbutrin to every other day for a month and then stop     02/12/2014 f/u ov/Leni Pankonin re: hbp/ hypothyroidism/ rml syndrome  Chief Complaint  Patient presents with  . Annual Exam    Pt fasting. She is feeling well and denies any co's today.   rec Prevnar    05/08/2015  f/u ov/Thurma Priego re: hbp/hypothyroid/ rml syndrome  Chief Complaint  Patient presents with  . Follow-up    Annual Exam - no current complaints. No refill needed.   Not limited by breathing from desired activities  But not aerobically active      No tia, claudication, ex cp or sob/ cough  Sleeping ok without nocturnal  or early am exacerbation  of respiratory  c/o's. Also denies any obvious fluctuation of symptoms with weather or environmental changes or other aggravating or alleviating factors except as outlined above   ROS  The following are not active complaints unless bolded sore throat, dysphagia, dental problems, itching, sneezing,  Variable nasal congestion or excess secretions, ear ache,   fever, chills, sweats, unintended wt loss, pleuritic or exertional cp, hemoptysis,  orthopnea pnd or leg swelling, presyncope, palpitations, heartburn, abdominal pain, anorexia, nausea, vomiting, diarrhea  or change in bowel or urinary habits, change in stools or urine, dysuria,hematuria,  rash, arthralgias R Hip, visual complaints, headache, numbness weakness or ataxia or problems with walking or  coordination,  change in mood/affect or memory.        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/1996  DJD R Hip..............................................................................................Marland KitchenMarland KitchenAlusio  HEALTH MAINTENANCE.........................................................................Marland KitchenWert  - GYN = Grubb PA  - Colonoscopy neg 04/23/2004  - Td 06/2008  - Pneumovax 07/2004  Severe localized reaction/ prevnar 13 02/12/2014  - CPX  05/08/2015     Family History:  allergies in mother  lymphoma in mother  asthma in brother 55 years older only sibling  colon ca brother  Prostate ca same brother neg cva/aneurysms  IHD Father onset 51s  IPF in brother 61 y older   Social History:  Quit smokng Jan 2003   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 Nl external ear canals without cough  reflex  Neck without JVD/Nodes/TM  Lungs clear to A and P bilaterally without cough on insp or exp maneuvers  RRR no s3 or murmur or increase in P2  Abd soft and non tender  Ext warm without calf tenderness, cyanosis clubbing or edema  Skin warm and dry without lesions  MS   nl gait. No major joint restrictions  Neuro alert, approp, no motor or cerebellar def or pathologic reflexes  Rectal/ gyn:  Per P Grubb       Labs ordered/ reviewed:      Chemistry      Component Value Date/Time    NA 139 05/08/2015 0948   K 4.0 05/08/2015 0948   CL 100 05/08/2015 0948   CO2 29 05/08/2015 0948   BUN 15 05/08/2015 0948   CREATININE 0.88 05/08/2015 0948      Component Value Date/Time   CALCIUM 9.8 05/08/2015 0948   ALKPHOS 69 05/08/2015 0948   AST 61* 05/08/2015 0948   ALT 68* 05/08/2015 0948   BILITOT 0.8 05/08/2015 0948        Lab Results  Component Value Date   WBC 5.3 05/08/2015   HGB 13.0 05/08/2015   HCT 37.4 05/08/2015   MCV 87.4 05/08/2015   PLT 218.0 05/08/2015        Lab Results  Component Value Date   TSH 3.15 05/08/2015           CXR PA and Lateral:   05/08/2015 :    I personally reviewed images and agree with radiology impression as follows:    COPD with scarring, stable. No superimposed acute abnormality.   Assessment & Plan:

## 2015-05-10 NOTE — Assessment & Plan Note (Addendum)
  Lab Results  Component Value Date   HGB 13.0 05/08/2015   HGB 11.9* 08/24/2013   HGB 11.7* 08/23/2013     Resolved despite recent blood donation

## 2015-05-10 NOTE — Assessment & Plan Note (Signed)
-   neg colonoscopy 04/23/2004  Appears to be due for repeat

## 2015-05-10 NOTE — Assessment & Plan Note (Signed)
Adequate control on present rx, reviewed > no change in rx needed   

## 2015-05-10 NOTE — Assessment & Plan Note (Signed)
Target ldl < 130 pos fm hx, h/o smoking, hbp  Lab Results  Component Value Date   CHOL 252* 05/08/2015   HDL 54.40 05/08/2015   LDLCALC 166* 05/08/2015   LDLDIRECT 181.0 02/12/2014   TRIG 158.0* 05/08/2015   CHOLHDL 5 05/08/2015    Not ideal though HDL relatively high/ rec diet/ ext for now

## 2015-05-10 NOTE — Assessment & Plan Note (Addendum)
ekg wnl/ cxr s cardiomegarly   Adequate control on present rx, reviewed > no change in rx needed    I had an extended discussion with the patient reviewing all relevant studies completed to date and  lasting 15 to 20 minutes of a 25 minute visit    Each maintenance medication was reviewed in detail including most importantly the difference between maintenance and prns and under what circumstances the prns are to be triggered using an action plan format that is not reflected in the computer generated alphabetically organized AVS.    Please see instructions for details which were reviewed in writing and the patient given a copy highlighting the part that I personally wrote and discussed at today's ov.

## 2015-05-11 ENCOUNTER — Telehealth: Payer: Self-pay | Admitting: *Deleted

## 2015-05-11 DIAGNOSIS — R7989 Other specified abnormal findings of blood chemistry: Secondary | ICD-10-CM

## 2015-05-11 DIAGNOSIS — Z83719 Family history of colon polyps, unspecified: Secondary | ICD-10-CM

## 2015-05-11 DIAGNOSIS — R945 Abnormal results of liver function studies: Principal | ICD-10-CM

## 2015-05-11 DIAGNOSIS — Z8371 Family history of colonic polyps: Secondary | ICD-10-CM

## 2015-05-11 NOTE — Telephone Encounter (Signed)
-----   Message from [name], MD sent at 05/11/2015  9:37 AM EDT ----- Let her know after further review she needs to see GI  1) overdue to colonoscopy   2) her lfts are just a slt bit elevated vs previous numbers   3) set up for consult for Dr Carlean Purl for both

## 2015-05-11 NOTE — Telephone Encounter (Signed)
LMTCB

## 2015-05-11 NOTE — Telephone Encounter (Signed)
Patient returned call, states she is leaving but should be back by 4 pm today, CB [phone].

## 2015-05-11 NOTE — Telephone Encounter (Signed)
Called spoke with patient and discussed MW's recs to refer to GI as stated below.  Pt okay with this and voiced her understanding.  Referral placed.  Nothing further needed; will sign off.

## 2015-05-22 ENCOUNTER — Other Ambulatory Visit: Payer: Self-pay | Admitting: Internal Medicine

## 2015-07-03 DIAGNOSIS — H2513 Age-related nuclear cataract, bilateral: Secondary | ICD-10-CM | POA: Diagnosis not present

## 2015-07-03 DIAGNOSIS — L719 Rosacea, unspecified: Secondary | ICD-10-CM | POA: Diagnosis not present

## 2015-07-03 DIAGNOSIS — H04123 Dry eye syndrome of bilateral lacrimal glands: Secondary | ICD-10-CM | POA: Diagnosis not present

## 2015-07-03 DIAGNOSIS — H401131 Primary open-angle glaucoma, bilateral, mild stage: Secondary | ICD-10-CM | POA: Diagnosis not present

## 2015-07-03 DIAGNOSIS — H35371 Puckering of macula, right eye: Secondary | ICD-10-CM | POA: Diagnosis not present

## 2015-07-29 ENCOUNTER — Other Ambulatory Visit: Payer: Self-pay | Admitting: Nurse Practitioner

## 2015-07-29 DIAGNOSIS — Z1231 Encounter for screening mammogram for malignant neoplasm of breast: Secondary | ICD-10-CM

## 2015-08-04 ENCOUNTER — Ambulatory Visit: Payer: Medicare Other | Admitting: Nurse Practitioner

## 2015-08-07 DIAGNOSIS — H35371 Puckering of macula, right eye: Secondary | ICD-10-CM | POA: Diagnosis not present

## 2015-08-07 DIAGNOSIS — H04123 Dry eye syndrome of bilateral lacrimal glands: Secondary | ICD-10-CM | POA: Diagnosis not present

## 2015-08-07 DIAGNOSIS — H401131 Primary open-angle glaucoma, bilateral, mild stage: Secondary | ICD-10-CM | POA: Diagnosis not present

## 2015-08-07 DIAGNOSIS — L719 Rosacea, unspecified: Secondary | ICD-10-CM | POA: Diagnosis not present

## 2015-08-07 DIAGNOSIS — H2513 Age-related nuclear cataract, bilateral: Secondary | ICD-10-CM | POA: Diagnosis not present

## 2015-08-08 ENCOUNTER — Other Ambulatory Visit: Payer: Self-pay | Admitting: Internal Medicine

## 2015-08-12 ENCOUNTER — Ambulatory Visit: Payer: Medicare Other

## 2015-08-31 ENCOUNTER — Ambulatory Visit
Admission: RE | Admit: 2015-08-31 | Discharge: 2015-08-31 | Disposition: A | Discharge status: Home / Self Care | Payer: Medicare Other | Source: Ambulatory Visit | Attending: Nurse Practitioner | Admitting: Nurse Practitioner

## 2015-08-31 DIAGNOSIS — Z1231 Encounter for screening mammogram for malignant neoplasm of breast: Secondary | ICD-10-CM | POA: Diagnosis not present

## 2015-10-16 ENCOUNTER — Other Ambulatory Visit: Payer: Self-pay | Admitting: Internal Medicine

## 2015-10-16 MED ORDER — BUPROPION HCL ER (SR) 150 MG PO TB12: 150.0000 mg | ORAL_TABLET | Freq: Every day | ORAL | 1 refills | Status: DC

## 2015-12-22 DIAGNOSIS — H04123 Dry eye syndrome of bilateral lacrimal glands: Secondary | ICD-10-CM | POA: Diagnosis not present

## 2015-12-22 DIAGNOSIS — H43813 Vitreous degeneration, bilateral: Secondary | ICD-10-CM | POA: Diagnosis not present

## 2015-12-22 DIAGNOSIS — L719 Rosacea, unspecified: Secondary | ICD-10-CM | POA: Diagnosis not present

## 2015-12-22 DIAGNOSIS — H35371 Puckering of macula, right eye: Secondary | ICD-10-CM | POA: Diagnosis not present

## 2015-12-22 DIAGNOSIS — H401131 Primary open-angle glaucoma, bilateral, mild stage: Secondary | ICD-10-CM | POA: Diagnosis not present

## 2015-12-22 DIAGNOSIS — H2513 Age-related nuclear cataract, bilateral: Secondary | ICD-10-CM | POA: Diagnosis not present

## 2016-01-10 DIAGNOSIS — J209 Acute bronchitis, unspecified: Secondary | ICD-10-CM | POA: Diagnosis not present

## 2016-01-14 ENCOUNTER — Telehealth: Payer: Self-pay | Admitting: Internal Medicine

## 2016-01-14 MED ORDER — AZITHROMYCIN 250 MG PO TABS: ORAL_TABLET | ORAL | 0 refills | Status: AC

## 2016-01-14 NOTE — Telephone Encounter (Signed)
Patient is returning phone call.  °

## 2016-01-14 NOTE — Telephone Encounter (Signed)
Called and spoke with pt and she stated that she has been sick since Saturday with a temp of 100.  She did go on Sunday to the [facility] and was told that she had bronchitis and that this was viral.  She was told to take mucinex and benzonatate which she has been doing. She was given rx for prednisone, but did not take any of this yet.  She has been sneezing and head congestion and non productive cough.  She stated that her temp today is 98.8, which is higher than her normal 97.0.  She is wanting to know if MW has any other recs?  Please advise. Thanks  Allergies  Allergen Reactions  . Penicillins     unknown  . Nickel Rash

## 2016-01-14 NOTE — Telephone Encounter (Signed)
lmtcb

## 2016-01-14 NOTE — Telephone Encounter (Signed)
Called and spoke with pt and she is aware of ZPak that has been sent to her pharmacy. Nothing further is needed.

## 2016-01-14 NOTE — Telephone Encounter (Signed)
z-pak 

## 2016-01-22 DIAGNOSIS — H2513 Age-related nuclear cataract, bilateral: Secondary | ICD-10-CM | POA: Diagnosis not present

## 2016-01-22 DIAGNOSIS — L719 Rosacea, unspecified: Secondary | ICD-10-CM | POA: Diagnosis not present

## 2016-01-22 DIAGNOSIS — H35371 Puckering of macula, right eye: Secondary | ICD-10-CM | POA: Diagnosis not present

## 2016-01-22 DIAGNOSIS — H04123 Dry eye syndrome of bilateral lacrimal glands: Secondary | ICD-10-CM | POA: Diagnosis not present

## 2016-01-22 DIAGNOSIS — H43813 Vitreous degeneration, bilateral: Secondary | ICD-10-CM | POA: Diagnosis not present

## 2016-01-22 DIAGNOSIS — H401131 Primary open-angle glaucoma, bilateral, mild stage: Secondary | ICD-10-CM | POA: Diagnosis not present

## 2016-04-29 DIAGNOSIS — L918 Other hypertrophic disorders of the skin: Secondary | ICD-10-CM | POA: Diagnosis not present

## 2016-04-29 DIAGNOSIS — L814 Other melanin hyperpigmentation: Secondary | ICD-10-CM | POA: Diagnosis not present

## 2016-04-29 DIAGNOSIS — D1801 Hemangioma of skin and subcutaneous tissue: Secondary | ICD-10-CM | POA: Diagnosis not present

## 2016-04-29 DIAGNOSIS — L821 Other seborrheic keratosis: Secondary | ICD-10-CM | POA: Diagnosis not present

## 2016-04-29 DIAGNOSIS — Z85828 Personal history of other malignant neoplasm of skin: Secondary | ICD-10-CM | POA: Diagnosis not present

## 2016-05-06 DIAGNOSIS — H10412 Chronic giant papillary conjunctivitis, left eye: Secondary | ICD-10-CM | POA: Diagnosis not present

## 2016-05-06 DIAGNOSIS — H04123 Dry eye syndrome of bilateral lacrimal glands: Secondary | ICD-10-CM | POA: Diagnosis not present

## 2016-05-06 DIAGNOSIS — H401131 Primary open-angle glaucoma, bilateral, mild stage: Secondary | ICD-10-CM | POA: Diagnosis not present

## 2016-05-13 ENCOUNTER — Other Ambulatory Visit: Payer: Self-pay | Admitting: Internal Medicine

## 2016-05-24 DIAGNOSIS — H10412 Chronic giant papillary conjunctivitis, left eye: Secondary | ICD-10-CM | POA: Diagnosis not present

## 2016-05-24 DIAGNOSIS — H401131 Primary open-angle glaucoma, bilateral, mild stage: Secondary | ICD-10-CM | POA: Diagnosis not present

## 2016-05-24 DIAGNOSIS — H2513 Age-related nuclear cataract, bilateral: Secondary | ICD-10-CM | POA: Diagnosis not present

## 2016-05-24 DIAGNOSIS — L719 Rosacea, unspecified: Secondary | ICD-10-CM | POA: Diagnosis not present

## 2016-07-05 DIAGNOSIS — L821 Other seborrheic keratosis: Secondary | ICD-10-CM | POA: Diagnosis not present

## 2016-07-05 DIAGNOSIS — Z85828 Personal history of other malignant neoplasm of skin: Secondary | ICD-10-CM | POA: Diagnosis not present

## 2016-07-14 ENCOUNTER — Ambulatory Visit (INDEPENDENT_AMBULATORY_CARE_PROVIDER_SITE_OTHER): Payer: Medicare Other | Admitting: Internal Medicine

## 2016-07-14 ENCOUNTER — Encounter: Payer: Self-pay | Admitting: Internal Medicine

## 2016-07-14 ENCOUNTER — Ambulatory Visit (INDEPENDENT_AMBULATORY_CARE_PROVIDER_SITE_OTHER)
Admission: RE | Admit: 2016-07-14 | Discharge: 2016-07-14 | Disposition: A | Discharge status: Home / Self Care | Payer: Medicare Other | Source: Ambulatory Visit | Attending: Internal Medicine | Admitting: Internal Medicine

## 2016-07-14 VITALS — BP 124/84 | HR 93 | Temp 99.1°F | Ht 64.0 in | Wt 162.4 lb

## 2016-07-14 DIAGNOSIS — R509 Fever, unspecified: Secondary | ICD-10-CM | POA: Diagnosis not present

## 2016-07-14 DIAGNOSIS — J069 Acute upper respiratory infection, unspecified: Secondary | ICD-10-CM | POA: Insufficient documentation

## 2016-07-14 MED ORDER — PREDNISONE 10 MG PO TABS: ORAL_TABLET | ORAL | 0 refills | Status: DC

## 2016-07-14 MED ORDER — AZITHROMYCIN 250 MG PO TABS: ORAL_TABLET | ORAL | 0 refills | Status: DC

## 2016-07-14 NOTE — Patient Instructions (Addendum)
Try prilosec otc 20mg   Take 30-60 min before first meal of the day and  ranitidine 150  mg one @  bedtime until cough is completely gone for at least a week without the need for cough suppression  GERD (REFLUX)  is an extremely common cause of respiratory symptoms just like yours , many times with no obvious heartburn at all.    It can be treated with medication, but also with lifestyle changes including elevation of the head of your bed (ideally with 6 inch  bed blocks),  Smoking cessation, avoidance of late meals, excessive alcohol, and avoid fatty foods, chocolate, peppermint, colas, red wine, and acidic juices such as orange juice.  NO MINT OR MENTHOL PRODUCTS SO NO COUGH DROPS   USE SUGARLESS CANDY INSTEAD (Jolley ranchers or Stover's or Life Savers) or even ice chips will also do - the key is to swallow to prevent all throat clearing. NO OIL BASED VITAMINS - use powdered substitutes.   zpak   Prednisone 10 mg take  4 each am x 2 days,   2 each am x 2 days,  1 each am x 2 days and stop   Please remember to go to the  x-ray department downstairs in the basement  for your tests - we will call you with the results when they are available.      Please schedule a follow up visit in 3 months with cpx -   but call sooner if needed for ent referral if not improved on above rx

## 2016-07-14 NOTE — Progress Notes (Signed)
Subjective:    Patient ID: [name], female    DOB: October 09, 1950    MRN: [id number]     Brief patient profile:  52  yowf quit smoking 2003 with h/o HBP and ? rml syndrome.   History of Present Illness  June 26, 2008 cpx only c/o = hoarseness and indigestion off and on for up to sev years, worse for several months, better with tums and not eating as much late in evening. no cough or sob over baseline rec trial of diet only to reduce likelihood of gerd      07/14/2016 acute extended ov/[name] re: cough  ? [name]  Chief Complaint  Patient presents with  . Acute Visit    Pt states last two weeks feels like s/t is in her throat when swollowing here and there not all the time. No issues breathing, and sleeping well at night. Pt is coughing some,nothing comes up. Pt states mouth dryer than usual in last two weeks.   cough and sensation of throat congestion worse in am's   Assoc with nasal d/c / some sneezing  But no excess/ purulent sputum or mucus plugs    No obvious day to day or daytime variability or assoc sob or cp or chest tightness, subjective wheeze or overt sinus or hb symptoms. No unusual exp hx or h/o childhood pna/ asthma or knowledge of premature birth.  Sleeping ok without nocturnal  or early am exacerbation  of respiratory  c/o's or need for noct saba. Also denies any obvious fluctuation of symptoms with weather or environmental changes or other aggravating or alleviating factors except as outlined above   Current Medications, Allergies, Complete Past Medical History, Past Surgical History, Family History, and Social History were reviewed in [employer] record.  ROS  The following are not active complaints unless bolded sore throat, dysphagia, dental problems, itching, sneezing,  nasal congestion or excess/ purulent secretions, ear ache,   fever, chills, sweats, unintended wt loss, classically pleuritic or exertional cp, hemoptysis,  orthopnea pnd or leg  swelling, presyncope, palpitations, abdominal pain, anorexia, nausea, vomiting, diarrhea  or change in bowel or bladder habits, change in stools or urine, dysuria,hematuria,  rash, arthralgias, visual complaints, headache, numbness, weakness or ataxia or problems with walking or coordination,  change in mood/affect or memory.               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/1996  DJD R Hip..............................................................................................Marland KitchenMarland KitchenAlusio  HEALTH MAINTENANCE.........................................................................Marland KitchenWert  - GYN = [facility]  - Colonoscopy neg 04/23/2004  - Td 06/2008  - Pneumovax 07/2004  Severe localized reaction/ prevnar 13 02/12/2014  - CPX  05/08/2015     Family History:  allergies in mother  lymphoma in mother  asthma in brother 4 years older only sibling  colon ca brother  Prostate ca same brother neg cva/aneurysms  IHD Father onset 67s  IPF in brother 52 y older   Social History:  Quit smokng Jan 2003   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 Nl external ear canals without cough reflex / eyes injected bilaterally  Neck without JVD/Nodes/TM  Lungs clear to A and P bilaterally without cough on insp or exp maneuvers  RRR no  s3 or murmur or increase in P2  Abd soft and non tender  Ext warm without calf tenderness, cyanosis clubbing or edema  Skin warm and dry without lesions  MS   nl gait. No major joint restrictions  Neuro alert, approp, no motor or cerebellar def or pathologic reflexes      CXR PA and Lateral:   07/14/2016 :    I personally reviewed images and agree with radiology impression as follows:    minimal increased in markings, no def as dz     .   Assessment & Plan:

## 2016-07-15 NOTE — Assessment & Plan Note (Addendum)
Probably viral or allergic or non-specific tracheitis.  Of the three most common causes of  Sub-acute or recurrent or chronic cough, only one (GERD)  can actually contribute to/ trigger  the other two (asthma and post nasal drip syndrome)  and perpetuate the cylce of cough.  While not intuitively obvious, many patients with chronic low grade reflux do not cough until there is a primary insult that disturbs the protective epithelial barrier and exposes sensitive nerve endings.   This is typically viral as is probably the case here  but can be direct physical injury such as with an endotracheal tube.   The point is that once this occurs, it is difficult to eliminate the cycle  using anything but a maximally effective acid suppression regimen at least in the short run, accompanied by an appropriate diet to address non acid GERD and control  And rx empirically with zpak and pred x 6 days only    I had an extended discussion with the patient reviewing all relevant studies completed to date and  lasting 15 to 20 minutes of a 25 minute acute  visit  For pt not seen here > 1 year  Each maintenance medication was reviewed in detail including most importantly the difference between maintenance and prns and under what circumstances the prns are to be triggered using an action plan format that is not reflected in the computer generated alphabetically organized AVS.    Please see AVS for specific instructions unique to this visit that I personally wrote and verbalized to the the pt in detail and then reviewed with pt  by my nurse highlighting any  changes in therapy recommended at today's visit to their plan of care.

## 2016-08-06 ENCOUNTER — Other Ambulatory Visit: Payer: Self-pay | Admitting: Internal Medicine

## 2016-08-08 ENCOUNTER — Telehealth: Payer: Self-pay | Admitting: Internal Medicine

## 2016-08-08 DIAGNOSIS — M7989 Other specified soft tissue disorders: Secondary | ICD-10-CM

## 2016-08-08 DIAGNOSIS — M79675 Pain in left toe(s): Secondary | ICD-10-CM

## 2016-08-08 NOTE — Telephone Encounter (Signed)
Pt calling with toe pain and swelling x 12 days --- 2nd toe on left foot Pt states that it just started with redness and pain.  Pt states that symptoms subsided for a few days then returned with severe swelling on 08/04/16.  Pt states that her toe is not so much swollen today, no heat to the touch.  Took 2 Aleve PM and the swelling has gone down a lot and not tender right now.  Pt states that she remembers stumping her toes on some wooden steps about 2 weeks ago but there did not seem to be any pain associated with the injury.   Pt has tried ice -- seems to not help 100% Wrapping the toe did not work Aleve has seemed to be a temporary fix.  Please advise Dr Melvyn Novas. Thanks.

## 2016-08-08 NOTE — Telephone Encounter (Signed)
Ortho eval Dr Lynann Bologna or she can to to [facility] podiatry

## 2016-08-08 NOTE — Telephone Encounter (Signed)
Spoke with patient. Referral placed for Podiatry. Nothing further needed.

## 2016-08-09 ENCOUNTER — Ambulatory Visit (INDEPENDENT_AMBULATORY_CARE_PROVIDER_SITE_OTHER): Payer: Medicare Other | Admitting: Podiatry

## 2016-08-09 ENCOUNTER — Encounter: Payer: Self-pay | Admitting: Podiatry

## 2016-08-09 ENCOUNTER — Ambulatory Visit (INDEPENDENT_AMBULATORY_CARE_PROVIDER_SITE_OTHER): Payer: Medicare Other

## 2016-08-09 DIAGNOSIS — M109 Gout, unspecified: Secondary | ICD-10-CM

## 2016-08-09 DIAGNOSIS — M2042 Other hammer toe(s) (acquired), left foot: Secondary | ICD-10-CM

## 2016-08-09 DIAGNOSIS — L03032 Cellulitis of left toe: Secondary | ICD-10-CM | POA: Diagnosis not present

## 2016-08-09 NOTE — Progress Notes (Signed)
She presents today as a new patient with a chief complaint of pain to the second toe of the left foot. States this than aching since July 11. She states that is suddenly turned red hot and swollen and hurts so badly she can hardly touch it. She goes on to say that she injured this by jamming about a month ago. She states that she's been taking Aleve for her. She denies any other traumas denies having gout in the past denies fever chills nausea vomiting muscle aches and pains. She states that currently he feels better today I can actually start to bend it. She denies that there are any other joints look like this or have ever been like this she also denies any tick bites.  Objective: Vital signs are stable alert and oriented 3. Pulses are palpable. Neurologic sensorium is intact bilaterally. Deep tendon reflexes are brisk and equal bilateral. Muscle strength +5 over 5 dorsiflexion plantar flexors and inverters everters all into the musculature is intact. 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 The majority of the swelling is located over the DIPJ as well as the redness. It is minimally tender on palpation but there is postinflammatory hyperpigmentation extending down to the level of the metatarsophalangeal joint. Radiographs taken in the office today 3 views of the left foot does demonstrate a swollen toe with bone to bone contact at the DIPJ I see no fractures. There is a small cyst in the intermediate phalanx which created a sub-be associated with gout. Cutaneous evaluation demonstrates swollen warm toe no open lesions or wounds the toenail does appear to be slightly loosened and does demonstrate some bruising proximally.  Assessment: At this point cannot rule out a gouty capsulitis of the second toe or even a paronychia of the nail.  Plan: I'm going to put her on doxycycline and a six-day dose of methylprednisolone. I  will follow-up with her in about 2-3 weeks if this is not improved blood will be necessary. I also mentioned to her that if this ever happens again she is to come in and at least let a nurse right a prescription for arthritic profile to rule out gout.

## 2016-08-09 NOTE — Progress Notes (Signed)
   Subjective:    Patient ID: [name], female    DOB: 03-Jul-1950, 66 y.o.   MRN: [id number]  HPI    Review of Systems  Eyes: Positive for redness.  Respiratory: Positive for cough.   All other systems reviewed and are negative.      Objective:   Physical Exam        Assessment & Plan:

## 2016-08-10 MED ORDER — METHYLPREDNISOLONE 4 MG PO TBPK: ORAL_TABLET | ORAL | 0 refills | Status: DC

## 2016-08-10 MED ORDER — DOXYCYCLINE HYCLATE 100 MG PO TABS: 100.0000 mg | ORAL_TABLET | Freq: Two times a day (BID) | ORAL | 0 refills | Status: DC

## 2016-08-11 ENCOUNTER — Telehealth: Payer: Self-pay | Admitting: Podiatry

## 2016-08-11 DIAGNOSIS — M109 Gout, unspecified: Secondary | ICD-10-CM

## 2016-08-11 NOTE — Telephone Encounter (Signed)
I saw Dr. Milinda Pointer this past Tuesday for possible gout or a bacterial infection. He prescribed an antibiotic and prednisone. I filled both and I started the prednisone which has helped immensely with the pain. The only thing is I have a new place on my foot that is the same thing so I'm sure it is probably gout. It is more where typical gout would be which is the side of the foot by the big toe. I have not started taking the antibiotic yet and it light of this I was wondering if I should start taking it or wait. If you would please call me back at [phone].

## 2016-08-11 NOTE — Telephone Encounter (Addendum)
Left message informing pt I had reviewed the LOV and Dr. Milinda Pointer had wanted her to have labs drawn if new event occurred and I had sent labs to [facility] to be performed at her convenience. I also told her I would call with instructions concerning the antibiotic. Pt called states she is not sure if she needs the labs. I read pt, Dr. Stephenie Acres last sentence in her PLAN, and pt states she now remembers and will get the labs on Monday. I told her I would call with instructions.

## 2016-08-11 NOTE — Telephone Encounter (Signed)
Yes correct.

## 2016-08-12 ENCOUNTER — Other Ambulatory Visit: Payer: Self-pay | Admitting: Internal Medicine

## 2016-08-15 DIAGNOSIS — M109 Gout, unspecified: Secondary | ICD-10-CM | POA: Diagnosis not present

## 2016-08-15 LAB — CBC WITH DIFFERENTIAL/PLATELET
BASOS ABS: 0 {cells}/uL (ref 0–200)
Basophils Relative: 0 %
EOS PCT: 2 %
Eosinophils Absolute: 176 cells/uL (ref 15–500)
HEMATOCRIT: 45 % (ref 35.0–45.0)
Hemoglobin: 15.3 g/dL (ref 11.7–15.5)
LYMPHS ABS: 2552 {cells}/uL (ref 850–3900)
Lymphocytes Relative: 29 %
MCH: 30.7 pg (ref 27.0–33.0)
MCHC: 34 g/dL (ref 32.0–36.0)
MCV: 90.4 fL (ref 80.0–100.0)
MONO ABS: 968 {cells}/uL — AB (ref 200–950)
MPV: 8.9 fL (ref 7.5–12.5)
Monocytes Relative: 11 %
NEUTROS ABS: 5104 {cells}/uL (ref 1500–7800)
NEUTROS PCT: 58 %
Platelets: 291 10*3/uL (ref 140–400)
RBC: 4.98 MIL/uL (ref 3.80–5.10)
RDW: 13.7 % (ref 11.0–15.0)
WBC: 8.8 10*3/uL (ref 3.8–10.8)

## 2016-08-16 LAB — SEDIMENTATION RATE: SED RATE: 1 mm/h (ref 0–30)

## 2016-08-16 LAB — C-REACTIVE PROTEIN: CRP: 1.2 mg/L (ref <8.0)

## 2016-08-16 LAB — ANA: ANA: NEGATIVE

## 2016-08-16 LAB — URIC ACID: URIC ACID, SERUM: 6 mg/dL (ref 2.5–7.0)

## 2016-08-16 LAB — RHEUMATOID FACTOR: Rhuematoid fact SerPl-aCnc: <14 IU/mL (ref <14)

## 2016-08-17 ENCOUNTER — Telehealth: Payer: Self-pay | Admitting: *Deleted

## 2016-08-17 NOTE — Telephone Encounter (Addendum)
-----   Message from Garrel Ridgel, [place] sent at 08/16/2016  5:00 PM EDT ----- Blood work is negative.08/17/2016-I informed pt of Dr. Stephenie Acres review of results.

## 2016-08-25 ENCOUNTER — Encounter: Payer: Self-pay | Admitting: Podiatry

## 2016-08-25 ENCOUNTER — Ambulatory Visit (INDEPENDENT_AMBULATORY_CARE_PROVIDER_SITE_OTHER): Payer: Medicare Other | Admitting: Podiatry

## 2016-08-25 DIAGNOSIS — M109 Gout, unspecified: Secondary | ICD-10-CM | POA: Diagnosis not present

## 2016-08-27 NOTE — Progress Notes (Signed)
She presents today for follow-up of her second toe left foot. She states it seems to be doing better than it was the swelling has gone gone down considerably I did not take the antibiotic but I did take the steroid. She states that she did notice some redness around the first metatarsophalangeal joint but really not a lot of tenderness or soreness.  Objective: Vital signs are stable alert and oriented 3. Pulses are palpable. Neurologic sensorium is intact. Due to reflexes are intact. Toe has gone down considerably minimal edema and minimal erythema no cellulitis drainage or odor still appears to be slightly ecchymotic. Capillary fill time is immediate. Mild erythema. Very mild erythema to the medial aspect of the first metatarsophalangeal joint with mild bunion deformity. There is minimal tenderness on palpation no tenderness on range of motion of the joint.  Assessment: Resolving gout second digit left foot. Possible gout first metatarsophalangeal joint.  Plan: I instructed her to follow up with our office should she develop any new signs or symptoms of this. I expressed to her that she needs to come in immediately and have a nurse provide her with a requisition for a uric acid. She will need to have blood drawn immediately to assess the level of uric acid even if she does not see a doctor that day. She will need to see a doctor immediately however. We may need to consider changing her triamterene hydrochlorothiazide.

## 2016-08-28 ENCOUNTER — Encounter: Payer: Self-pay | Admitting: Internal Medicine

## 2016-08-28 DIAGNOSIS — M109 Gout, unspecified: Secondary | ICD-10-CM | POA: Insufficient documentation

## 2016-10-04 DIAGNOSIS — H401131 Primary open-angle glaucoma, bilateral, mild stage: Secondary | ICD-10-CM | POA: Diagnosis not present

## 2016-10-04 DIAGNOSIS — H35371 Puckering of macula, right eye: Secondary | ICD-10-CM | POA: Diagnosis not present

## 2016-10-04 DIAGNOSIS — H10412 Chronic giant papillary conjunctivitis, left eye: Secondary | ICD-10-CM | POA: Diagnosis not present

## 2016-10-04 DIAGNOSIS — H43813 Vitreous degeneration, bilateral: Secondary | ICD-10-CM | POA: Diagnosis not present

## 2016-10-04 DIAGNOSIS — L719 Rosacea, unspecified: Secondary | ICD-10-CM | POA: Diagnosis not present

## 2016-10-04 DIAGNOSIS — H04123 Dry eye syndrome of bilateral lacrimal glands: Secondary | ICD-10-CM | POA: Diagnosis not present

## 2016-10-04 DIAGNOSIS — H2513 Age-related nuclear cataract, bilateral: Secondary | ICD-10-CM | POA: Diagnosis not present

## 2016-10-14 ENCOUNTER — Ambulatory Visit: Payer: Medicare Other | Admitting: Internal Medicine

## 2016-10-21 ENCOUNTER — Ambulatory Visit (INDEPENDENT_AMBULATORY_CARE_PROVIDER_SITE_OTHER): Payer: Medicare Other | Admitting: Internal Medicine

## 2016-10-21 ENCOUNTER — Other Ambulatory Visit (INDEPENDENT_AMBULATORY_CARE_PROVIDER_SITE_OTHER): Payer: Medicare Other

## 2016-10-21 ENCOUNTER — Encounter: Payer: Self-pay | Admitting: Internal Medicine

## 2016-10-21 VITALS — BP 124/82 | HR 79 | Temp 98.9°F | Ht 64.25 in | Wt 156.0 lb

## 2016-10-21 DIAGNOSIS — J069 Acute upper respiratory infection, unspecified: Secondary | ICD-10-CM

## 2016-10-21 DIAGNOSIS — Z23 Encounter for immunization: Secondary | ICD-10-CM | POA: Diagnosis not present

## 2016-10-21 DIAGNOSIS — I10 Essential (primary) hypertension: Secondary | ICD-10-CM

## 2016-10-21 DIAGNOSIS — E785 Hyperlipidemia, unspecified: Secondary | ICD-10-CM

## 2016-10-21 DIAGNOSIS — E039 Hypothyroidism, unspecified: Secondary | ICD-10-CM

## 2016-10-21 LAB — URINALYSIS
BILIRUBIN URINE: NEGATIVE
Hgb urine dipstick: NEGATIVE
KETONES UR: NEGATIVE
LEUKOCYTES UA: NEGATIVE
NITRITE: NEGATIVE
PH: 6 (ref 5.0–8.0)
TOTAL PROTEIN, URINE-UPE24: NEGATIVE
Urine Glucose: NEGATIVE
Urobilinogen, UA: 0.2 (ref 0.0–1.0)

## 2016-10-21 LAB — BASIC METABOLIC PANEL
BUN: 11 mg/dL (ref 6–23)
CALCIUM: 10 mg/dL (ref 8.4–10.5)
CO2: 28 meq/L (ref 19–32)
Chloride: 94 mEq/L — ABNORMAL LOW (ref 96–112)
Creatinine, Ser: 0.76 mg/dL (ref 0.40–1.20)
GFR: 80.33 mL/min (ref >60.00)
Glucose, Bld: 104 mg/dL — ABNORMAL HIGH (ref 70–99)
Potassium: 4.1 mEq/L (ref 3.5–5.1)
SODIUM: 132 meq/L — AB (ref 135–145)

## 2016-10-21 LAB — HEPATIC FUNCTION PANEL
ALBUMIN: 4.9 g/dL (ref 3.5–5.2)
ALK PHOS: 64 U/L (ref 39–117)
ALT: 24 U/L (ref 0–35)
AST: 23 U/L (ref 0–37)
BILIRUBIN DIRECT: 0.2 mg/dL (ref 0.0–0.3)
TOTAL PROTEIN: 7.9 g/dL (ref 6.0–8.3)
Total Bilirubin: 1 mg/dL (ref 0.2–1.2)

## 2016-10-21 LAB — LIPID PANEL
CHOL/HDL RATIO: 5
Cholesterol: 237 mg/dL — ABNORMAL HIGH (ref 0–200)
HDL: 52.5 mg/dL (ref >39.00)
LDL CALC: 156 mg/dL — AB (ref 0–99)
NonHDL: 184.75
Triglycerides: 142 mg/dL (ref 0.0–149.0)
VLDL: 28.4 mg/dL (ref 0.0–40.0)

## 2016-10-21 LAB — CBC WITH DIFFERENTIAL/PLATELET
BASOS ABS: 0 10*3/uL (ref 0.0–0.1)
Basophils Relative: 0.6 % (ref 0.0–3.0)
EOS ABS: 0.2 10*3/uL (ref 0.0–0.7)
Eosinophils Relative: 2.8 % (ref 0.0–5.0)
HCT: 44.2 % (ref 36.0–46.0)
HEMOGLOBIN: 15 g/dL (ref 12.0–15.0)
LYMPHS PCT: 22.4 % (ref 12.0–46.0)
Lymphs Abs: 1.3 10*3/uL (ref 0.7–4.0)
MCHC: 34 g/dL (ref 30.0–36.0)
MCV: 89.6 fl (ref 78.0–100.0)
MONOS PCT: 11.6 % (ref 3.0–12.0)
Monocytes Absolute: 0.7 10*3/uL (ref 0.1–1.0)
Neutro Abs: 3.6 10*3/uL (ref 1.4–7.7)
Neutrophils Relative %: 62.6 % (ref 43.0–77.0)
Platelets: 255 10*3/uL (ref 150.0–400.0)
RBC: 4.94 Mil/uL (ref 3.87–5.11)
RDW: 12.8 % (ref 11.5–15.5)
WBC: 5.8 10*3/uL (ref 4.0–10.5)

## 2016-10-21 LAB — TSH: TSH: 7.78 u[IU]/mL — ABNORMAL HIGH (ref 0.35–4.50)

## 2016-10-21 NOTE — Progress Notes (Signed)
Subjective:   Patient ID: [name], female    DOB: 1950-09-08    MRN: [id number]     Brief patient profile:  34  yowf quit smoking 2003 with h/o HBP and ? rml syndrome   History of Present Illness  June 26, 2008 cpx only c/o = hoarseness and indigestion off and on for up to sev years, worse for several months, better with tums and not eating as much late in evening. no cough or sob over baseline rec trial of diet only to reduce likelihood of gerd      07/14/2016 acute extended ov/[name] re: cough  ? [name]  Chief Complaint  Patient presents with  . Acute Visit    Pt states last two weeks feels like s/t is in her throat when swollowing here and there not all the time. No issues breathing, and sleeping well at night. Pt is coughing some,nothing comes up. Pt states mouth dryer than usual in last two weeks.   cough and sensation of throat congestion worse in am's   Assoc with nasal d/c / some sneezing  But no excess/ purulent sputum or mucus plugs   rec Try prilosec otc 20mg   Take 30-60 min before first meal of the day and  ranitidine 150  mg one @  bedtime until cough is completely gone for at least a week without the need for cough suppression GERD  zpak  Prednisone 10 mg take  4 each am x 2 days,   2 each am x 2 days,  1 each am x 2 days and stop  Please schedule a follow up visit in 3 months with cpx -   but call sooner if needed for ent referral if not improved on above rx    10/21/2016  f/u ov/[name] re: hbp/ s/p gout flare on hctz completely resolved/ hyperlipidemia  Chief Complaint  Patient presents with  . Follow-up    She has occ cough with clear sputum. She is sneezing and watery eyes.    fall sneezing and watery eyes for decades worse x sev weeks better with otcs Good ex tol/ sleeping ok   Not limited by breathing from desired activities  /nor claudication/ cp  No obvious day to day or daytime variability or assoc excess/ purulent sputum or mucus plugs or hemoptysis  or  chest tightness, subjective wheeze or overt sinus or hb symptoms. No unusual exp hx or h/o childhood pna/ asthma or knowledge of premature birth.  Sleeping ok flat without nocturnal  or early am exacerbation  of respiratory  c/o's or need for noct saba. Also denies any obvious fluctuation of symptoms with weather or environmental changes or other aggravating or alleviating factors except as outlined above   Current Allergies, Complete Past Medical History, Past Surgical History, Family History, and Social History were reviewed in [employer] record.  ROS  The following are not active complaints unless bolded Hoarseness, sore throat, dysphagia, dental problems, itching, sneezing,  nasal congestion or discharge of excess mucus or purulent secretions, ear ache,   fever, chills, sweats, unintended wt loss or wt gain, classically pleuritic or exertional cp,  orthopnea pnd or leg swelling, presyncope, palpitations, abdominal pain, anorexia, nausea, vomiting, diarrhea  or change in bowel habits or change in bladder habits, change in stools or change in urine, dysuria, hematuria,  rash, arthralgias, visual complaints, headache, numbness, weakness or ataxia or problems with walking or coordination,  change in mood/affect or memory.  Current Meds  Medication Sig  . aspirin 81 MG tablet Take 81 mg by mouth daily.  Marland Kitchen buPROPion (WELLBUTRIN SR) 150 MG 12 hr tablet Take 150 mg by mouth 3 (three) times a week.  . diphenhydrAMINE (BENADRYL) 25 MG tablet Take 25 mg by mouth at bedtime as needed for sleep.   . hydroxypropyl methylcellulose (ISOPTO TEARS) 2.5 % ophthalmic solution Place 1 drop into both eyes 3 (three) times daily as needed for dry eyes.  Marland Kitchen levothyroxine (SYNTHROID, LEVOTHROID) 25 MCG tablet Take 25 mcg by mouth every Monday, Wednesday, and Friday.  . loperamide (IMODIUM A-D) 2 MG tablet Take 2 mg by mouth as needed for diarrhea or loose stools.  Marland Kitchen loteprednol (LOTEMAX) 0.5  % ophthalmic suspension Place 1 drop into both eyes 2 (two) times daily as needed (dry eyes).  . Multiple Vitamin (MULTIVITAMIN) capsule Take 1 capsule by mouth every other day.  . Olopatadine HCl 0.2 % SOLN   . OVER THE COUNTER MEDICATION [employer] brand sugar free cough drops as needed  . Probiotic CAPS Take 1 capsule by mouth daily.  . ranitidine (ZANTAC) 150 MG tablet Take 150 mg by mouth daily as needed for heartburn.   . sodium chloride (OCEAN) 0.65 % nasal spray Place 1 spray into the nose 2 (two) times daily as needed for congestion.   . Travoprost, BAK Free, (TRAVATAN) 0.004 % SOLN ophthalmic solution Place 1 drop into both eyes at bedtime.  . triamterene-hydrochlorothiazide (MAXZIDE-25) 37.5-25 MG tablet TAKE 1 TABLET BY MOUTH EVERY DAY                 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/1996  DJD R Hip..............................................................................................Marland KitchenMarland KitchenAlusio  HEALTH MAINTENANCE.........................................................................Marland KitchenWert  - GYN = [facility] PA  - Colonoscopy neg 04/23/2004  - Td 06/2008  - Pneumovax 07/2004  Severe localized reaction/ prevnar 13 02/12/2014  - CPX  10/21/2016     Family History:  allergies in mother  lymphoma in mother  asthma in brother 13 years older only sibling  colon ca brother  Prostate ca same brother neg cva/aneurysms  IHD Father onset 66s  IPF in brother 4 y older   Social History:  Quit smokng Jan 2003   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 Nl external ear canals without cough reflex  Neck without JVD/Nodes/TM  Lungs clear to A and P bilaterally without cough on insp or exp maneuvers  RRR no s3 or murmur or increase in P2  Abd soft and non tender  Ext warm without calf tenderness, cyanosis clubbing or edema  Skin warm and dry without  lesions  MS   nl gait. No major joint restrictions  Neuro alert, approp, no motor or cerebellar def or pathologic reflexes   Labs ordered/ reviewed:      Chemistry      Component Value Date/Time   NA 132 (L) 10/21/2016 1101   K 4.1 10/21/2016 1101   CL 94 (L) 10/21/2016 1101   CO2 28 10/21/2016 1101   BUN 11 10/21/2016 1101   CREATININE 0.76 10/21/2016 1101      Component Value Date/Time   CALCIUM 10.0 10/21/2016 1101   ALKPHOS 64 10/21/2016 1101   AST 23 10/21/2016 1101   ALT 24 10/21/2016 1101   BILITOT 1.0 10/21/2016 1101        Lab Results  Component Value Date   WBC 5.8 10/21/2016   HGB 15.0 10/21/2016   HCT 44.2 10/21/2016   MCV 89.6 10/21/2016   PLT 255.0 10/21/2016       EOS                       0.2                                                                    10/21/2016       Lab Results  Component Value Date   TSH 7.78 (H) 10/21/2016      Labs ordered 10/21/2016  Allergy profile           .   Assessment & Plan:

## 2016-10-21 NOTE — Patient Instructions (Addendum)
Please remember to go to the lab department downstairs in the basement  for your tests - we will call you with the results when they are available.      Let me know if you have another gout attack as you will need to stop the maxzide   Return in 6 months if you have not established with [facility]

## 2016-10-22 NOTE — Assessment & Plan Note (Signed)
Not optimally controlled on present regimen - rec take the synthroid daily, not tiw, and  Follow up per Primary Care planned

## 2016-10-22 NOTE — Assessment & Plan Note (Signed)
Probably allergic / not infections > check allergy profile, no abx

## 2016-10-22 NOTE — Assessment & Plan Note (Signed)
Adequate control on present rx, reviewed in detail with pt > no change in rx needed  For now but if another attack of gout will need to change to ARB or Beta blocker and stop hctz/ advised    I had an extended discussion with the patient reviewing all relevant studies completed to date and  lasting 15 to 20 minutes of a 25 minute visit    Each maintenance medication was reviewed in detail including most importantly the difference between maintenance and prns and under what circumstances the prns are to be triggered using an action plan format that is not reflected in the computer generated alphabetically organized AVS.    Please see AVS for specific instructions unique to this visit that I personally wrote and verbalized to the the pt in detail and then reviewed with pt  by my nurse highlighting any  changes in therapy recommended at today's visit to their plan of care.

## 2016-10-22 NOTE — Assessment & Plan Note (Signed)
Target ldl < 130 pos fm hx, h/o smoking, hbp  Lab Results  Component Value Date   CHOL 237 (H) 10/21/2016   HDL 52.50 10/21/2016   LDLCALC 156 (H) 10/21/2016   LDLDIRECT 181.0 02/12/2014   TRIG 142.0 10/21/2016   CHOLHDL 5 10/21/2016    High ldl may be partly related to high tsh/ will correct this first, encourage diet /ex and defer further rx to PCP to establish in [place] with [place] there

## 2016-10-24 ENCOUNTER — Telehealth: Payer: Self-pay | Admitting: Internal Medicine

## 2016-10-24 ENCOUNTER — Other Ambulatory Visit: Payer: Self-pay | Admitting: Internal Medicine

## 2016-10-24 LAB — RESPIRATORY ALLERGY PROFILE REGION II ~~LOC~~
Allergen, Cedar tree, t12: <0.1 kU/L
Allergen, Mouse Urine Protein, e78: <0.1 kU/L
Allergen, Mulberry, t76: <0.1 kU/L
Allergen, Oak,t7: <0.1 kU/L
Allergen, P. notatum, m1: <0.1 kU/L
Aspergillus fumigatus, m3: <0.1 kU/L
Box Elder IgE: <0.1 kU/L
CLASS: 0
CLASS: 0
CLASS: 0
CLASS: 0
CLASS: 0
CLASS: 0
CLASS: 0
CLASS: 0
CLASS: 0
Cat Dander: <0.1 kU/L
Class: 0
Class: 0
Class: 0
Class: 0
Class: 0
Class: 0
Class: 0
Class: 0
Class: 0
Class: 0
Class: 0
Class: 0
Class: 0
Class: 0
Class: 0
Cockroach: <0.1 kU/L
D. farinae: <0.1 kU/L
Elm IgE: <0.1 kU/L
Johnson Grass: <0.1 kU/L
Rough Pigweed  IgE: <0.1 kU/L
Timothy Grass: <0.1 kU/L

## 2016-10-24 LAB — INTERPRETATION:

## 2016-10-24 NOTE — Telephone Encounter (Signed)
Notes recorded by [name], MD on 10/21/2016 at 1:22 PM EDT Call patient : Studies are ok x thyroid too low > change synthroid to daily dosing   Pt is aware of results and voiced her understanding. Nothing further needed.

## 2016-10-24 NOTE — Progress Notes (Signed)
LMTCB

## 2016-11-01 ENCOUNTER — Telehealth: Payer: Self-pay | Admitting: Internal Medicine

## 2016-11-01 NOTE — Telephone Encounter (Signed)
Spoke with pt, advised allergy testing normal. Pt understood and nothing further is needed.

## 2017-02-01 DIAGNOSIS — H10412 Chronic giant papillary conjunctivitis, left eye: Secondary | ICD-10-CM | POA: Diagnosis not present

## 2017-02-01 DIAGNOSIS — H2513 Age-related nuclear cataract, bilateral: Secondary | ICD-10-CM | POA: Diagnosis not present

## 2017-02-01 DIAGNOSIS — H04123 Dry eye syndrome of bilateral lacrimal glands: Secondary | ICD-10-CM | POA: Diagnosis not present

## 2017-02-01 DIAGNOSIS — H5213 Myopia, bilateral: Secondary | ICD-10-CM | POA: Diagnosis not present

## 2017-02-01 DIAGNOSIS — H401131 Primary open-angle glaucoma, bilateral, mild stage: Secondary | ICD-10-CM | POA: Diagnosis not present

## 2017-02-03 ENCOUNTER — Telehealth: Payer: Self-pay | Admitting: Internal Medicine

## 2017-02-03 NOTE — Telephone Encounter (Signed)
Spoke with Dr. Marigene Ehlers office to [employer].  Nothing further needed.

## 2017-02-03 NOTE — Telephone Encounter (Signed)
MW is it ok to place pt on Timolol? Please advise.

## 2017-02-03 NOTE — Telephone Encounter (Signed)
Should be fine but be on the lookout for wheezing/sob  and if develops will need to consider an alternative like betoptic but Dr Jerline Pain is fully aware of this issue

## 2017-03-29 DIAGNOSIS — H10412 Chronic giant papillary conjunctivitis, left eye: Secondary | ICD-10-CM | POA: Diagnosis not present

## 2017-03-29 DIAGNOSIS — H401132 Primary open-angle glaucoma, bilateral, moderate stage: Secondary | ICD-10-CM | POA: Diagnosis not present

## 2017-03-29 DIAGNOSIS — H04123 Dry eye syndrome of bilateral lacrimal glands: Secondary | ICD-10-CM | POA: Diagnosis not present

## 2017-07-07 DIAGNOSIS — H2513 Age-related nuclear cataract, bilateral: Secondary | ICD-10-CM | POA: Diagnosis not present

## 2017-07-07 DIAGNOSIS — H10412 Chronic giant papillary conjunctivitis, left eye: Secondary | ICD-10-CM | POA: Diagnosis not present

## 2017-07-07 DIAGNOSIS — L719 Rosacea, unspecified: Secondary | ICD-10-CM | POA: Diagnosis not present

## 2017-07-07 DIAGNOSIS — H401132 Primary open-angle glaucoma, bilateral, moderate stage: Secondary | ICD-10-CM | POA: Diagnosis not present

## 2017-07-10 ENCOUNTER — Telehealth: Payer: Self-pay | Admitting: Internal Medicine

## 2017-07-10 DIAGNOSIS — K219 Gastro-esophageal reflux disease without esophagitis: Secondary | ICD-10-CM

## 2017-07-10 NOTE — Telephone Encounter (Signed)
Fine with me  Dx gerd

## 2017-07-10 NOTE — Telephone Encounter (Signed)
Pt aware of GI referral by MW -order placed and pt will await a call for apt date and time.

## 2017-07-10 NOTE — Telephone Encounter (Signed)
Spoke with pt. She is requesting a referral to [facility]. This referral would be for unresolved GERD.  Dr. Melvyn Novas - please advise. Thanks.

## 2017-07-13 ENCOUNTER — Ambulatory Visit (INDEPENDENT_AMBULATORY_CARE_PROVIDER_SITE_OTHER): Payer: Medicare Other | Admitting: Physician Assistant

## 2017-07-13 ENCOUNTER — Other Ambulatory Visit (INDEPENDENT_AMBULATORY_CARE_PROVIDER_SITE_OTHER): Payer: Medicare Other

## 2017-07-13 ENCOUNTER — Encounter: Payer: Self-pay | Admitting: Physician Assistant

## 2017-07-13 VITALS — BP 122/68 | HR 68 | Ht 64.5 in | Wt 160.0 lb

## 2017-07-13 DIAGNOSIS — R05 Cough: Secondary | ICD-10-CM | POA: Diagnosis not present

## 2017-07-13 DIAGNOSIS — R531 Weakness: Secondary | ICD-10-CM

## 2017-07-13 DIAGNOSIS — Z1211 Encounter for screening for malignant neoplasm of colon: Secondary | ICD-10-CM

## 2017-07-13 DIAGNOSIS — T17308A Unspecified foreign body in larynx causing other injury, initial encounter: Secondary | ICD-10-CM

## 2017-07-13 DIAGNOSIS — K219 Gastro-esophageal reflux disease without esophagitis: Secondary | ICD-10-CM | POA: Diagnosis not present

## 2017-07-13 DIAGNOSIS — R059 Cough, unspecified: Secondary | ICD-10-CM

## 2017-07-13 LAB — COMPREHENSIVE METABOLIC PANEL
ALBUMIN: 4.8 g/dL (ref 3.5–5.2)
ALT: 33 U/L (ref 0–35)
AST: 39 U/L — AB (ref 0–37)
Alkaline Phosphatase: 67 U/L (ref 39–117)
BILIRUBIN TOTAL: 0.6 mg/dL (ref 0.2–1.2)
BUN: 12 mg/dL (ref 6–23)
CO2: 32 mEq/L (ref 19–32)
CREATININE: 0.75 mg/dL (ref 0.40–1.20)
Calcium: 9.5 mg/dL (ref 8.4–10.5)
Chloride: 89 mEq/L — ABNORMAL LOW (ref 96–112)
GFR: 81.39 mL/min (ref >60.00)
GLUCOSE: 101 mg/dL — AB (ref 70–99)
Potassium: 3.2 mEq/L — ABNORMAL LOW (ref 3.5–5.1)
SODIUM: 128 meq/L — AB (ref 135–145)
Total Protein: 7.8 g/dL (ref 6.0–8.3)

## 2017-07-13 LAB — CBC WITH DIFFERENTIAL/PLATELET
BASOS ABS: 0 10*3/uL (ref 0.0–0.1)
Basophils Relative: 0.4 % (ref 0.0–3.0)
EOS ABS: 0.1 10*3/uL (ref 0.0–0.7)
EOS PCT: 1.6 % (ref 0.0–5.0)
HCT: 38.9 % (ref 36.0–46.0)
Hemoglobin: 13.7 g/dL (ref 12.0–15.0)
LYMPHS ABS: 1 10*3/uL (ref 0.7–4.0)
Lymphocytes Relative: 22.4 % (ref 12.0–46.0)
MCHC: 35.3 g/dL (ref 30.0–36.0)
MCV: 87.6 fl (ref 78.0–100.0)
MONO ABS: 0.5 10*3/uL (ref 0.1–1.0)
Monocytes Relative: 11.8 % (ref 3.0–12.0)
NEUTROS PCT: 63.8 % (ref 43.0–77.0)
Neutro Abs: 2.8 10*3/uL (ref 1.4–7.7)
PLATELETS: 211 10*3/uL (ref 150.0–400.0)
RBC: 4.45 Mil/uL (ref 3.87–5.11)
RDW: 13.2 % (ref 11.5–15.5)
WBC: 4.4 10*3/uL (ref 4.0–10.5)

## 2017-07-13 LAB — TSH: TSH: 26.71 u[IU]/mL — AB (ref 0.35–4.50)

## 2017-07-13 MED ORDER — OMEPRAZOLE 40 MG PO CPDR: DELAYED_RELEASE_CAPSULE | ORAL | 8 refills | Status: DC

## 2017-07-13 NOTE — Patient Instructions (Addendum)
Your provider has requested that you go to the basement level for lab work before leaving today. Press "B" on the elevator. The lab is located at the first door on the left as you exit the elevator.  You may have a light breakfast the morning of prep day (the day before the procedure).   You may choose from one of the following items: eggs and toast OR chicken noodle soup and crackers.   You should have your breakfast completed between 8:00 and 9:00 am the day before your procedure.    After you have had your light breakfast you should start a clear liquid diet only, NO SOLIDS. No additional solid food is allowed. You may continue to have clear liquid up to 3 hours prior to your procedure.    We sent refills for Omeprazole 40 mg to your pharmacy. We have provided you with the antireflux diet information.

## 2017-07-13 NOTE — Progress Notes (Addendum)
Subjective:    Patient ID: [name], female    DOB: 07-18-1950, 67 y.o.   MRN: [id number]  HPI [name] is a pleasant 67 year old white female, referred today by Dr.Wert, and known remotely to Dr. Carlean Purl from prior colonoscopy done in 2006 for screening.  This was a normal exam. Patient has history of hypertension, osteoarthritis and hypothyroidism. She says she is been having a difficult time over the past 4 to 6 weeks with ongoing issues with intermittent coughing episodes choking and gagging and says she frequently will bring up clear material.  Is been waking her at night as well.  He has also developed increase in belching burping and says she frequently feels like she needs to burp but cannot.  She denies any chest pain.  No clear heartburn or indigestion and no dysphagia to liquids or solids.  She tried over-the-counter omeprazole 20 mg for 14 days says that her symptoms did improve but she stopped it as the package directed.  She is recently just gone back on it and says it does not seem to be helping as much this time. He has had some intermittent acid reflux in the past but nothing significant and has used Tums etc. Appetite has been fine, weight has been stable. She says she feels like some of her symptoms may be allergy related but was told by Dr. Melvyn Novas that that was not the case.  She has tried OTC allergy meds but finds them too sedating. She has not had any coughing or choking episodes directly related to eating or swallowing and just says this is occurring off and on during the day and at nighttime. No prior EGD. Patient has no lower GI complaints, specifically no problems with rectal bleeding, no changes in bowel habits melena or hematochezia.  She does mention that her brother has had polyps  Review of Systems Pertinent positive and negative review of systems were noted in the above HPI section.  All other review of systems was otherwise negative.  Outpatient Encounter  Medications as of 07/13/2017  Medication Sig  . aspirin 81 MG tablet Take 81 mg by mouth daily.  . diphenhydrAMINE (BENADRYL) 25 MG tablet Take 25 mg by mouth at bedtime as needed for sleep.   . hydroxypropyl methylcellulose (ISOPTO TEARS) 2.5 % ophthalmic solution Place 1 drop into both eyes 3 (three) times daily as needed for dry eyes.  Marland Kitchen levothyroxine (SYNTHROID, LEVOTHROID) 25 MCG tablet Take 1 tablet (25 mcg total) by mouth daily before breakfast.  . loperamide (IMODIUM A-D) 2 MG tablet Take 2 mg by mouth as needed for diarrhea or loose stools.  . Multiple Vitamin (MULTIVITAMIN) capsule Take 1 capsule by mouth every other day.  . Olopatadine HCl 0.2 % SOLN   . Omega-3 Fatty Acids (FISH OIL) 1000 MG CAPS Take by mouth every other day.  Marland Kitchen omeprazole (PRILOSEC) 20 MG capsule Take 20 mg by mouth daily.  Marland Kitchen OVER THE COUNTER MEDICATION [employer] brand sugar free cough drops as needed  . Probiotic CAPS Take 1 capsule by mouth daily.  . ranitidine (ZANTAC) 150 MG tablet Take 150 mg by mouth daily as needed for heartburn.   . sodium chloride (OCEAN) 0.65 % nasal spray Place 1 spray into the nose 2 (two) times daily as needed for congestion.   . Travoprost, BAK Free, (TRAVATAN) 0.004 % SOLN ophthalmic solution Place 1 drop into both eyes at bedtime.  . triamterene-hydrochlorothiazide (MAXZIDE-25) 37.5-25 MG tablet TAKE 1 TABLET BY MOUTH EVERY  DAY  . Vitamin D, Cholecalciferol, 400 units CAPS Take by mouth once a week.  Marland Kitchen omeprazole (PRILOSEC) 40 MG capsule Take 1 tab by mouth daily before breakfast.  . [DISCONTINUED] buPROPion (WELLBUTRIN SR) 150 MG 12 hr tablet Take 150 mg by mouth 3 (three) times a week.  . [DISCONTINUED] loteprednol (LOTEMAX) 0.5 % ophthalmic suspension Place 1 drop into both eyes 2 (two) times daily as needed (dry eyes).   No facility-administered encounter medications on file as of 07/13/2017.    Allergies  Allergen Reactions  . Penicillins     unknown  . Nickel Rash   Patient  Active Problem List   Diagnosis Date Noted  . Acute gout of foot 08/28/2016  . Upper respiratory infection, acute 07/14/2016  . Hyponatremia 08/23/2013  . Postoperative anemia due to acute blood loss 08/22/2013  . OA (osteoarthritis) of hip 08/21/2013  . Family history of colonic polyps 11/08/2010  . ARTHRALGIA UNSPECIFIED SITE 06/26/2008  . Hypothyroidism 06/29/2007  . Hyperlipidemia LDL goal <130 06/29/2007  . Essential hypertension 06/29/2007  . Osteopenia 06/29/2007   Social History   Socioeconomic History  . Marital status: Single    Spouse name: Not on file  . Number of children: 0  . Years of education: Not on file  . Highest education level: Not on file  Occupational History  . Occupation: LAWYER    Employer: [employer]  . Financial resource strain: Not on file  . Food insecurity:    Worry: Not on file    Inability: Not on file  . Transportation needs:    Medical: Not on file    Non-medical: Not on file  Tobacco Use  . Smoking status: Former Smoker    Packs/day: 0.50    Years: 20.00    Pack years: 10.00    Types: Cigarettes    Last attempt to quit: 01/17/2001    Years since quitting: 16.4  . Smokeless tobacco: Never Used  Substance and Sexual Activity  . Alcohol use: Yes    Alcohol/week: 3.0 oz    Types: 5 Glasses of wine per week    Comment: wine  . Drug use: No  . Sexual activity: Not on file  Lifestyle  . Physical activity:    Days per week: Not on file    Minutes per session: Not on file  . Stress: Not on file  Relationships  . Social connections:    Talks on phone: Not on file    Gets together: Not on file    Attends religious service: Not on file    Active member of club or organization: Not on file    Attends meetings of clubs or organizations: Not on file    Relationship status: Not on file  . Intimate partner violence:    Fear of current or ex partner: Not on file    Emotionally abused: Not on file    Physically  abused: Not on file    Forced sexual activity: Not on file  Other Topics Concern  . Not on file  Social History Narrative   Works in front of computer   Father anesthesiologist          [name] family history includes Allergies in her mother; Asthma in her brother; Colon polyps in her brother; Lymphoma in her mother; Other in her father; Pulmonary fibrosis (age of onset: 7) in her brother.      Objective:    Vitals:  07/13/17 0909  BP: 122/68  Pulse: 68    Physical Exam; well-developed white female in no acute distress, pleasant blood pressure 122/68 pulse 68, height 5 foot 4, weight 160, BMI 27.  HEENT ;nontraumatic normocephalic EOMI PERRLA sclera anicteric, Oropharynx clear, Cardiovascular ;regular rate and rhythm with S1-S2 no murmur rub or gallop, Pulmonary ;clear bilaterally, Abdomen ;soft, nontender nondistended bowel sounds are active there is no palpable mass or hepatosplenomegaly, Rectal ;exam not done, Extremities; no clubbing cyanosis or edema skin warm and dry, Neuro psych; alert and oriented, grossly nonfocal mood and affect appropriate       Assessment & Plan:   #43 67 year old white female with 4 to 6-week history of persistent frequent cough, intermittent choking episodes gagging and increase in belching and burping. It  is not clear whether all of her symptoms are related to acid reflux and may have more than one process causing symptoms.  #2 colon cancer surveillance-due for follow-up colonoscopy, normal exam 2006 #3 hypertension #4.  Osteoarthritis #5.  Hypothyroid  Plan; Start omeprazole 40 mg p.o. every morning AC breakfast Strict antireflux regimen.  She was provided with a diet, and we discussed n.p.o. for 3 hours prior to bedtime and elevation of the head of the bed with the back elevated at least 45 degrees. Patient will be scheduled for upper endoscopy and colonoscopy with Dr. Arelia Longest.  Both procedures were discussed in detail with the patient  including indications risks and benefits and she is agreeable to proceed. CBC, CMET and TSH  Tajah Schreiner S Elmo Rio PA-C 07/13/2017   Cc: [name], MD  Agree with [name] assessment and plan. Gatha Mayer, MD, [facility]

## 2017-07-14 ENCOUNTER — Telehealth: Payer: Self-pay | Admitting: Internal Medicine

## 2017-07-14 NOTE — Telephone Encounter (Signed)
Go ahead and take two daily 1 hour before first meal and make appt to see me after 4 weeks to let that take effect

## 2017-07-14 NOTE — Telephone Encounter (Signed)
Levothyroxine 25 mcg take 2 daily for 4 weeks then ov to recheck tsh and get her on a higher strength - should be taken 1 h before 1st meal

## 2017-07-14 NOTE — Telephone Encounter (Signed)
Attempted to call patient today regarding RX Levothyroxine 22mcg tabs. I did not receive an answer at time of call. I have left a voicemail message for pt to return call. X1

## 2017-07-14 NOTE — Telephone Encounter (Signed)
Patient returned phone call; pt contact # [phone]

## 2017-07-14 NOTE — Telephone Encounter (Signed)
Attempted to call patient today regarding results. I did not receive an answer at time of call. I have left a voicemail message for pt to return call. X1  

## 2017-07-14 NOTE — Telephone Encounter (Signed)
Called and spoke with the patient regarding RX Levothyroxine 98mcg tabs Pt had OV with Gastrologist yesterday, had labs completed and results were given this morning. Pt's TSH was 26.7; requesting if the medication could be changed  MW please advise.

## 2017-07-14 NOTE — Telephone Encounter (Signed)
Called and gave her MW levothyroxine recommendations.  Patient stated understanding. Follow up appointment made with MW 08/15/17 at 11:30.

## 2017-07-14 NOTE — Telephone Encounter (Signed)
Pt is calling back with a question about dosage. Cb is [phone].

## 2017-07-14 NOTE — Telephone Encounter (Signed)
MW can you please clarify the below message, pt was calling regarding her RX Levothyroxine 58mcg tabs

## 2017-07-17 NOTE — Telephone Encounter (Signed)
Spoke with pt. I have answered her questions about her dosing to the best of my ability. Nothing further was needed.

## 2017-07-18 DIAGNOSIS — D485 Neoplasm of uncertain behavior of skin: Secondary | ICD-10-CM | POA: Diagnosis not present

## 2017-07-18 DIAGNOSIS — D1801 Hemangioma of skin and subcutaneous tissue: Secondary | ICD-10-CM | POA: Diagnosis not present

## 2017-07-18 DIAGNOSIS — D2272 Melanocytic nevi of left lower limb, including hip: Secondary | ICD-10-CM | POA: Diagnosis not present

## 2017-07-18 DIAGNOSIS — D692 Other nonthrombocytopenic purpura: Secondary | ICD-10-CM | POA: Diagnosis not present

## 2017-07-18 DIAGNOSIS — L720 Epidermal cyst: Secondary | ICD-10-CM | POA: Diagnosis not present

## 2017-07-18 DIAGNOSIS — L814 Other melanin hyperpigmentation: Secondary | ICD-10-CM | POA: Diagnosis not present

## 2017-07-18 DIAGNOSIS — L821 Other seborrheic keratosis: Secondary | ICD-10-CM | POA: Diagnosis not present

## 2017-07-18 DIAGNOSIS — Z85828 Personal history of other malignant neoplasm of skin: Secondary | ICD-10-CM | POA: Diagnosis not present

## 2017-07-18 DIAGNOSIS — B078 Other viral warts: Secondary | ICD-10-CM | POA: Diagnosis not present

## 2017-07-18 DIAGNOSIS — L918 Other hypertrophic disorders of the skin: Secondary | ICD-10-CM | POA: Diagnosis not present

## 2017-07-18 DIAGNOSIS — D2112 Benign neoplasm of connective and other soft tissue of left upper limb, including shoulder: Secondary | ICD-10-CM | POA: Diagnosis not present

## 2017-08-02 ENCOUNTER — Other Ambulatory Visit: Payer: Self-pay | Admitting: Internal Medicine

## 2017-08-15 ENCOUNTER — Ambulatory Visit: Payer: Medicare Other | Admitting: Internal Medicine

## 2017-08-16 ENCOUNTER — Ambulatory Visit: Payer: Medicare Other | Admitting: Internal Medicine

## 2017-08-19 ENCOUNTER — Encounter (HOSPITAL_COMMUNITY): Payer: Self-pay | Admitting: *Deleted

## 2017-08-19 ENCOUNTER — Emergency Department (HOSPITAL_COMMUNITY): Payer: Medicare Other

## 2017-08-19 ENCOUNTER — Emergency Department (HOSPITAL_COMMUNITY)
Admission: EM | Admit: 2017-08-19 | Discharge: 2017-08-19 | Disposition: A | Discharge status: Home / Self Care | Payer: Medicare Other | Attending: Emergency Medicine | Admitting: Emergency Medicine

## 2017-08-19 ENCOUNTER — Other Ambulatory Visit: Payer: Self-pay

## 2017-08-19 DIAGNOSIS — R072 Precordial pain: Secondary | ICD-10-CM | POA: Diagnosis not present

## 2017-08-19 DIAGNOSIS — E039 Hypothyroidism, unspecified: Secondary | ICD-10-CM | POA: Insufficient documentation

## 2017-08-19 DIAGNOSIS — M546 Pain in thoracic spine: Secondary | ICD-10-CM | POA: Diagnosis not present

## 2017-08-19 DIAGNOSIS — Z7982 Long term (current) use of aspirin: Secondary | ICD-10-CM | POA: Diagnosis not present

## 2017-08-19 DIAGNOSIS — M549 Dorsalgia, unspecified: Secondary | ICD-10-CM

## 2017-08-19 DIAGNOSIS — Z87891 Personal history of nicotine dependence: Secondary | ICD-10-CM | POA: Diagnosis not present

## 2017-08-19 DIAGNOSIS — Z79899 Other long term (current) drug therapy: Secondary | ICD-10-CM | POA: Diagnosis not present

## 2017-08-19 DIAGNOSIS — J189 Pneumonia, unspecified organism: Secondary | ICD-10-CM

## 2017-08-19 DIAGNOSIS — R079 Chest pain, unspecified: Secondary | ICD-10-CM | POA: Diagnosis not present

## 2017-08-19 DIAGNOSIS — J181 Lobar pneumonia, unspecified organism: Secondary | ICD-10-CM | POA: Diagnosis not present

## 2017-08-19 DIAGNOSIS — Z85828 Personal history of other malignant neoplasm of skin: Secondary | ICD-10-CM | POA: Insufficient documentation

## 2017-08-19 DIAGNOSIS — I700 Atherosclerosis of aorta: Secondary | ICD-10-CM | POA: Diagnosis not present

## 2017-08-19 DIAGNOSIS — K219 Gastro-esophageal reflux disease without esophagitis: Secondary | ICD-10-CM | POA: Diagnosis not present

## 2017-08-19 DIAGNOSIS — Z96641 Presence of right artificial hip joint: Secondary | ICD-10-CM | POA: Insufficient documentation

## 2017-08-19 DIAGNOSIS — R918 Other nonspecific abnormal finding of lung field: Secondary | ICD-10-CM | POA: Diagnosis not present

## 2017-08-19 DIAGNOSIS — I10 Essential (primary) hypertension: Secondary | ICD-10-CM

## 2017-08-19 DIAGNOSIS — Z8719 Personal history of other diseases of the digestive system: Secondary | ICD-10-CM

## 2017-08-19 DIAGNOSIS — R0789 Other chest pain: Secondary | ICD-10-CM | POA: Diagnosis present

## 2017-08-19 LAB — CBC
HEMATOCRIT: 41.4 % (ref 36.0–46.0)
HEMOGLOBIN: 14.4 g/dL (ref 12.0–15.0)
MCH: 31.1 pg (ref 26.0–34.0)
MCHC: 34.8 g/dL (ref 30.0–36.0)
MCV: 89.4 fL (ref 78.0–100.0)
Platelets: 225 10*3/uL (ref 150–400)
RBC: 4.63 MIL/uL (ref 3.87–5.11)
RDW: 13.4 % (ref 11.5–15.5)
WBC: 5.3 10*3/uL (ref 4.0–10.5)

## 2017-08-19 LAB — I-STAT TROPONIN, ED
Troponin i, poc: 0 ng/mL (ref 0.00–0.08)
Troponin i, poc: 0 ng/mL (ref 0.00–0.08)

## 2017-08-19 LAB — BASIC METABOLIC PANEL
Anion gap: 11 (ref 5–15)
BUN: 11 mg/dL (ref 8–23)
CALCIUM: 9.3 mg/dL (ref 8.9–10.3)
CO2: 28 mmol/L (ref 22–32)
Chloride: 94 mmol/L — ABNORMAL LOW (ref 98–111)
Creatinine, Ser: 0.7 mg/dL (ref 0.44–1.00)
GLUCOSE: 117 mg/dL — AB (ref 70–99)
POTASSIUM: 3.8 mmol/L (ref 3.5–5.1)
Sodium: 133 mmol/L — ABNORMAL LOW (ref 135–145)

## 2017-08-19 MED ORDER — LEVOFLOXACIN 750 MG PO TABS: 750.0000 mg | ORAL_TABLET | Freq: Every day | ORAL | 0 refills | Status: DC

## 2017-08-19 MED ORDER — ASPIRIN 81 MG PO CHEW
243.0000 mg | CHEWABLE_TABLET | Freq: Once | ORAL | Status: AC
  Administered 2017-08-19: 243 mg via ORAL
  Filled 2017-08-19: qty 3

## 2017-08-19 MED ORDER — IOPAMIDOL (ISOVUE-370) INJECTION 76%
INTRAVENOUS | Status: DC
Start: 2017-08-19 — End: 2017-08-19
  Filled 2017-08-19: qty 100

## 2017-08-19 MED ORDER — IOPAMIDOL (ISOVUE-370) INJECTION 76%
100.0000 mL | Freq: Once | INTRAVENOUS | Status: AC | PRN
  Administered 2017-08-19: 100 mL via INTRAVENOUS

## 2017-08-19 MED ORDER — GI COCKTAIL ~~LOC~~
30.0000 mL | Freq: Once | ORAL | Status: AC
  Administered 2017-08-19: 30 mL via ORAL
  Filled 2017-08-19: qty 30

## 2017-08-19 NOTE — ED Notes (Signed)
Unable to collect labs patient going to xray will collect when she returns

## 2017-08-19 NOTE — ED Notes (Signed)
Bed: WA06 Expected date:  Expected time:  Means of arrival:  Comments: 

## 2017-08-19 NOTE — Discharge Instructions (Addendum)
Your work up today has been fairly reassuring, but your CT scan showed an area in your lungs that appears to show pneumonia. Take the antibiotic as directed until completed. This is possibly the cause of your pain, however there are also a variety of other causes including indigestion, gas pain, muscle pain, and other nonemergent issues. Alternate between tylenol and motrin as directed as needed for pain, always taking these on a full stomach and NEVER on an empty stomach. Stay well hydrated. You may consider using heat to the areas of pain, no more than 20 minutes every hour. If you think indigestion could be contributing to your symptoms, you may use over the counter tums, maalox, pepto bismol, zantac, etc to help with symptoms; avoid spicy/fatty/fried/acidic foods. Take all of your usual home medications as prescribed.   Also, your blood pressure was elevated today. Eat a low salt/low sodium diet, and take all of your home blood pressure medications as directed. Keep a log of your blood pressure readings from every morning and evening (making sure to give yourself at least 15 minutes of rest prior to checking it) and take it to your doctor's office at your next appointment for ongoing management of your blood pressure. Stay well hydrated and get plenty of rest. Avoid caffeine and other over the counter products that would make your blood pressure go up (such as decongestants, excedrin, etc).     Follow up with your regular doctor in 1 week for recheck of symptoms and ongoing management of your blood pressure. Return to the ER for changes or worsening symptoms.  SEEK IMMEDIATE MEDICAL ATTENTION IF: You develop a fever.  Your chest pains become severe or intolerable.  You develop new, unexplained symptoms (problems).  You develop shortness of breath, nausea, vomiting, sweating or feel light headed.  You develop a new cough or you cough up blood. You develop new leg swelling

## 2017-08-19 NOTE — ED Provider Notes (Signed)
[facility] DEPT Provider Note   CSN: [id number] Arrival date & time: 08/19/17  1043     History   Chief Complaint Chief Complaint  Patient presents with  . Back Pain    HPI [name] is a 67 y.o. female with a PMHx of HTN, hypothyroidism, HLD, GERD, gout, and other conditions listed below who presents to the ED with complaints of upper back pain that began yesterday around 7 PM and has been intermittent since then, returning in the morning today which is what prompted her to come for evaluation.  She describes the pain as 3-4/10 intermittent tightness between her shoulder blades, radiating somewhat into the center of her chest, with no known aggravating factors, unchanged with exertion or inspiration, and with no treatments tried prior to arrival.  She mentions that she has reflux, and recently had her Prilosec increased to 40 mg a day which initially helped however recently it has not been helping as much.  She was concerned that her symptoms could be related to a heart attack or something more severe so she wanted to be assessed.  She also wonders whether this could just be from her indigestion.  She states that she will have a dry cough from her reflux occasionally, but denies having increased/worsening cough recently, or any sputum production.  She mentions that she washed her horses leg yesterday prior to onset of symptoms and thought this could be related but she was not sure.  She took her triamterene 37.5 mg this morning.  She has not taken her Prilosec today because she has not eaten anything.  She has taken a baby aspirin which she takes on most days.  She is a non-smoker.  Her father had an MI sometime in his 68s, but she has no other known family history of cardiac disease that she is aware of.  She denies diaphoresis, lightheadedness, fevers, chills, productive cough, SOB, LE swelling, recent travel/surgery/immobilization, estrogen use,  personal/family hx of DVT/PE, abd pain, N/V/D/C, hematuria, dysuria, other myalgias, arthralgias, claudication, orthopnea, numbness, tingling, focal weakness, HA, vision changes, or any other complaints at this time.  The history is provided by the patient and medical records. No language interpreter was used.  Back Pain   Associated symptoms include chest pain. Pertinent negatives include no fever, no numbness, no headaches, no abdominal pain, no dysuria and no weakness.    Past Medical History:  Diagnosis Date  . Anxiety   . Cancer ([place])    basal skin cancer   . DJD (degenerative joint disease) of hip    right - [place]  . Dyslipidemia   . GERD (gastroesophageal reflux disease)   . Glaucoma (increased eye pressure)   . Hypertension   . Hypothyroidism   . Osteopenia   . Pneumonia    hx of pneumonia - lst time 1998  . PONV (postoperative nausea and vomiting)   . Vitreous floaters of left eye 04/2013    Patient Active Problem List   Diagnosis Date Noted  . Acute gout of foot 08/28/2016  . Upper respiratory infection, acute 07/14/2016  . Hyponatremia 08/23/2013  . Postoperative anemia due to acute blood loss 08/22/2013  . OA (osteoarthritis) of hip 08/21/2013  . Family history of colonic polyps 11/08/2010  . ARTHRALGIA UNSPECIFIED SITE 06/26/2008  . Hypothyroidism 06/29/2007  . Hyperlipidemia LDL goal <130 06/29/2007  . Essential hypertension 06/29/2007  . Osteopenia 06/29/2007    Past Surgical History:  Procedure Laterality Date  . FLEXIBLE  BRONCHOSCOPY  1998   for pneumonia recurrent  . TONSILLECTOMY AND ADENOIDECTOMY  child age 82  . TOTAL HIP ARTHROPLASTY Right 08/21/2013   Procedure: RIGHT TOTAL HIP ARTHROPLASTY ANTERIOR APPROACH;  Surgeon: Gearlean Alf, MD;  Location: [facility]S;  Service: Orthopedics;  Laterality: Right;     OB History    Gravida  0   Para  0   Term      Preterm      AB      Living        SAB      TAB      Ectopic      Multiple       Live Births               Home Medications    Prior to Admission medications   Medication Sig Start Date End Date Taking? Authorizing Provider  aspirin 81 MG tablet Take 81 mg by mouth daily.    [provider]  diphenhydrAMINE (BENADRYL) 25 MG tablet Take 25 mg by mouth at bedtime as needed for sleep.     [provider]  hydroxypropyl methylcellulose (ISOPTO TEARS) 2.5 % ophthalmic solution Place 1 drop into both eyes 3 (three) times daily as needed for dry eyes.    [provider]  levothyroxine (SYNTHROID, LEVOTHROID) 25 MCG tablet Take 1 tablet (25 mcg total) by mouth daily before breakfast. 10/24/16   [name], MD  loperamide (IMODIUM A-D) 2 MG tablet Take 2 mg by mouth as needed for diarrhea or loose stools.    [provider]  Multiple Vitamin (MULTIVITAMIN) capsule Take 1 capsule by mouth every other day.    [provider]  Olopatadine HCl 0.2 % SOLN  07/28/16   [provider]  Omega-3 Fatty Acids (FISH OIL) 1000 MG CAPS Take by mouth every other day.    [provider]  omeprazole (PRILOSEC) 20 MG capsule Take 20 mg by mouth daily.    [provider]  omeprazole (PRILOSEC) 40 MG capsule Take 1 tab by mouth daily before breakfast. 07/13/17   Esterwood, Amy S, PA-C  OVER THE COUNTER MEDICATION [employer] brand sugar free cough drops as needed    [provider]  Probiotic CAPS Take 1 capsule by mouth daily.    [provider]  ranitidine (ZANTAC) 150 MG tablet Take 150 mg by mouth daily as needed for heartburn.     [provider]  sodium chloride (OCEAN) 0.65 % nasal spray Place 1 spray into the nose 2 (two) times daily as needed for congestion.     [provider]  Travoprost, BAK Free, (TRAVATAN) 0.004 % SOLN ophthalmic solution Place 1 drop into both eyes at bedtime.    [provider]  triamterene-hydrochlorothiazide (MAXZIDE-25) 37.5-25 MG tablet TAKE 1  TABLET BY MOUTH EVERY DAY 08/02/17   [name], MD  Vitamin D, Cholecalciferol, 400 units CAPS Take by mouth once a week.    [provider]    Family History Family History  Problem Relation Age of Onset  . Allergies Mother   . Lymphoma Mother   . Asthma Brother   . Colon polyps Brother   . Pulmonary fibrosis Brother 72       ideopathic  . Other Father        IHD    Social History Social History   Tobacco Use  . Smoking status: Former Smoker    Packs/day: 0.50  Years: 20.00    Pack years: 10.00    Types: Cigarettes    Last attempt to quit: 01/17/2001    Years since quitting: 16.5  . Smokeless tobacco: Never Used  Substance Use Topics  . Alcohol use: Yes    Alcohol/week: 3.0 oz    Types: 5 Glasses of wine per week    Comment: wine  . Drug use: No     Allergies   Penicillins and Nickel   Review of Systems Review of Systems  Constitutional: Negative for chills, diaphoresis and fever.  Eyes: Negative for visual disturbance.  Respiratory: Negative for cough and shortness of breath.   Cardiovascular: Positive for chest pain. Negative for leg swelling.  Gastrointestinal: Negative for abdominal pain, constipation, diarrhea, nausea and vomiting.       +reflux  Genitourinary: Negative for dysuria and hematuria.  Musculoskeletal: Positive for back pain. Negative for arthralgias and myalgias.  Skin: Negative for color change.  Allergic/Immunologic: Negative for immunocompromised state.  Neurological: Negative for weakness, light-headedness, numbness and headaches.  Psychiatric/Behavioral: Negative for confusion.   All other systems reviewed and are negative for acute change except as noted in the HPI.    Physical Exam Updated Vital Signs BP (!) 183/97 (BP Location: Left Arm) Comment: pt took bp meds this morning  Pulse 81   Temp 98.4 F (36.9 C) (Oral)   Resp 16   Ht '5\' 4"'  (1.626 m)   Wt 72.6 kg (160 lb)   LMP 01/17/2001 (Approximate)   SpO2 95%    BMI 27.46 kg/m  Updated VS during exam: BP (!) 159/96   Pulse 76   Temp 98.4 F (36.9 C) (Oral)   Resp 14   Ht '5\' 4"'  (1.626 m)   Wt 72.6 kg (160 lb)   LMP 01/17/2001 (Approximate)   SpO2 98%   BMI 27.46 kg/m    Physical Exam  Constitutional: She is oriented to person, place, and time. Vital signs are normal. She appears well-developed and well-nourished.  Non-toxic appearance. No distress.  Afebrile, nontoxic, NAD, BP initially 183/97 but down to 159/96 during exam  HENT:  Head: Normocephalic and atraumatic.  Mouth/Throat: Oropharynx is clear and moist and mucous membranes are normal.  Eyes: Conjunctivae and EOM are normal. Right eye exhibits no discharge. Left eye exhibits no discharge.  Neck: Normal range of motion. Neck supple.  Cardiovascular: Normal rate, regular rhythm, normal heart sounds and intact distal pulses. Exam reveals no gallop and no friction rub.  No murmur heard. RRR, nl s1/s2, no m/r/g, distal pulses intact, no pedal edema   Pulmonary/Chest: Effort normal and breath sounds normal. No respiratory distress. She has no decreased breath sounds. She has no wheezes. She has no rhonchi. She has no rales. She exhibits no tenderness, no crepitus, no deformity and no retraction.  CTAB in all lung fields, no w/r/r, no hypoxia or increased WOB, speaking in full sentences, SpO2 98% on RA Chest wall nonTTP anteriorly and posteriorly, no midline spinal TTP to C/T spine, no paraspinous muscle TTP or spasms, and chest wall without crepitus, deformities, or retractions   Abdominal: Soft. Normal appearance and bowel sounds are normal. She exhibits no distension. There is no tenderness. There is no rigidity, no rebound, no guarding, no CVA tenderness, no tenderness at McBurney's point and negative Murphy's sign.  Musculoskeletal: Normal range of motion.  MAE x4 Strength and sensation grossly intact in all extremities Distal pulses intact No pedal edema, neg homan's bilaterally    All spinal  levels nonTTP without bony stepoffs or deformities, no midline or paraspinous muscle TTP  Neurological: She is alert and oriented to person, place, and time. She has normal strength. No sensory deficit.  Skin: Skin is warm, dry and intact. No rash noted.  Psychiatric: She has a normal mood and affect.  Nursing note and vitals reviewed.    ED Treatments / Results  Labs (all labs ordered are listed, but only abnormal results are displayed) Labs Reviewed  BASIC METABOLIC PANEL - Abnormal; Notable for the following components:      Result Value   Sodium 133 (*)    Chloride 94 (*)    Glucose, Bld 117 (*)    All other components within normal limits  CBC  I-STAT TROPONIN, ED  I-STAT TROPONIN, ED    EKG EKG Interpretation  Date/Time:  Saturday August 19 2017 11:38:24 EDT Ventricular Rate:  76 PR Interval:    QRS Duration: 83 QT Interval:  415 QTC Calculation: 467 R Axis:   102 Text Interpretation:  Sinus rhythm Right axis deviation Borderline T abnormalities, anterior leads Confirmed by Milton Ferguson [phone]) on 08/19/2017 2:05:10 PM   Radiology Dg Chest 2 View  Result Date: 08/19/2017 CLINICAL DATA:  Pt reports discomfort between shoulder blades that began around 19:00 yesterday. Pt describes the pain as intermittent pressure. Hx of HTN EXAM: CHEST - 2 VIEW COMPARISON:  Chest x-ray dated 07/14/2016. FINDINGS: Heart size and mediastinal contours are within normal limits. Subtle opacities within each lung are stable, compatible with previous description of chronic scarring/fibrosis. No new lung findings. No pleural effusion or pneumothorax seen. No acute or suspicious osseous finding. IMPRESSION: No active cardiopulmonary disease. No evidence of pneumonia or pulmonary edema. Electronically Signed   By: Franki Cabot M.D.   On: 08/19/2017 11:51   Ct Angio Chest/abd/pel For Dissection W And/or Wo Contrast  Result Date: 08/19/2017 CLINICAL DATA:  Superior back pain. Evaluate  for thoracic dissection and/or pulmonary embolism. EXAM: CT ANGIOGRAPHY CHEST, ABDOMEN AND PELVIS TECHNIQUE: Multidetector CT imaging through the chest, abdomen and pelvis was performed using the standard protocol during bolus administration of intravenous contrast. Multiplanar reconstructed images and MIPs were obtained and reviewed to evaluate the vascular anatomy. CONTRAST:  133m ISOVUE-370 IOPAMIDOL (ISOVUE-370) INJECTION 76% COMPARISON:  Chest radiograph-earlier same day FINDINGS: CTA CHEST FINDINGS Vascular Findings: No evidence of thoracic aortic aneurysm or dissection on this nongated examination. Review of the precontrast images is negative for the presence of an intramural hematoma. Normal heart size.  No pericardial effusion. Although this examination was not tailored for the evaluation the pulmonary arteries, there are no discrete filling defects within the central pulmonary arterial tree to suggest central pulmonary embolism. Normal caliber of the main pulmonary artery. ------------------------------------------------------------- Thoracic aortic measurements: Sinotubular junction 34 mm as measured in greatest oblique coronal dimension. Proximal ascending aorta 31 mm as measured in greatest oblique axial dimension at the level of the main pulmonary artery. Aortic arch aorta 26 mm as measured in greatest oblique sagittal dimension. Proximal descending thoracic aorta 23 mm as measured in greatest oblique axial dimension at the level of the main pulmonary artery. Distal descending thoracic aorta 23 mm as measured in greatest oblique axial dimension at the level of the diaphragmatic hiatus. Review of the MIP images confirms the above findings. ------------------------------------------------------------- Non-Vascular Findings: Mediastinum/Lymph Nodes: No mediastinal, hilar or axillary lymphadenopathy. Lungs/Pleura: Consolidative opacities with associated bronchiectasis within the right middle lobe and  lingula (representative images 89, series 6). There are additional areas  of bronchiectasis within the right upper lobe (representative image 49, series 6). Minimal architectural distortion and scarring within medial aspect of the right lung apex adjacent to the superior aspect of the right-side of the mediastinum. Punctate granuloma within the right upper lobe (image 32, series 6). No pleural effusion or pneumothorax. Musculoskeletal: Stigmata of DISH within the thoracic spine. Degenerative change C6-C7 with disc space height loss endplate irregularity and sclerosis. Regional soft tissues appear normal. Normal appearance of the thyroid gland. _________________________________________________________ CTA ABDOMEN AND PELVIS FINDINGS VASCULAR Aorta: Minimal amount of predominantly calcified atherosclerotic plaque with a normal caliber abdominal aorta, not resulting in a hemodynamically significant stenosis. No abdominal aortic dissection or periaortic stranding. Celiac: There is a minimal amount of calcified atherosclerotic plaque involving the origin of the celiac artery, not definitely resulting in hemodynamically significant stenosis. SMA: Widely patent without hemodynamically significant stenosis. A replaced right hepatic artery arises from the proximal SMA. The distal tributaries of the SMA appear widely patent without discrete intraluminal filling defect to suggest distal embolism. Renals: The bilateral renal arteries appear widely patent. IMA: Widely patent. Inflow: There is a minimal amount of mixed calcified and noncalcified atherosclerotic plaque within the bilateral normal caliber common and internal iliac arteries, not resulting in hemodynamically significant stenosis. The bilateral external iliac arteries are tortuous though of normal caliber and widely patent without hemodynamically significant narrowing. Minimal amount of mixed calcified and noncalcified atherosclerotic plaque within the bilateral common  femoral arteries, not resulting in hemodynamically significant stenosis. Veins: The IVC and pelvic venous system appear widely patent on this arterial phase examination. Review of the MIP images confirms the above findings. NON-VASCULAR Evaluation of the abdominal organs is limited to the arterial phase of enhancement. Hepatobiliary: Normal hepatic contour. There is a ill-defined punctate (subcentimeter) area of hyperenhancement involving the caudal aspect the right lobe liver (image 129, series 5, favored to represent a perfusion abnormality. Otherwise, no discrete hyperenhancing hepatic lesions. Normal appearance of the gallbladder given degree distention. No radiopaque gallstones. No intra extrahepatic biliary duct dilatation. No ascites. Pancreas: Normal appearance of the pancreas Spleen: Normal appearance of the spleen Adrenals/Urinary Tract: There is symmetric enhancement of the bilateral kidneys. No definite renal stones on this postcontrast examination. No discrete renal lesions. No urine obstruction or perinephric stranding. Normal appearance the bilateral adrenal glands. Normal appearance of the urinary bladder given degree distention, however note, evaluation degraded secondary streak artifact from patient's right total hip prosthesis. Stomach/Bowel: Moderate colonic stool burden without evidence of enteric obstruction. The cecum is located within the right mid hemiabdomen. Normal appearance of the terminal ileum and retrocecal appendix. No discrete areas of bowel wall thickening. No pneumoperitoneum, pneumatosis or portal venous gas. Lymphatic: No bulky retroperitoneal, mesenteric, pelvic or inguinal lymphadenopathy. Reproductive: Normal appearance of the pelvic organs for age. No free fluid the pelvic cul-de-sac. Other: Regional soft tissues appear normal. Musculoskeletal: No acute or aggressive osseous abnormalities. Post right total hip replacement, incompletely evaluated. Severe DDD of L4-L5 with  near complete disc space height loss, endplate irregularity and sclerosis. Review of the MIP images confirms the above findings. IMPRESSION: Chest CTA impression: 1. No acute cardiopulmonary disease. Specifically, no evidence of thoracic aortic aneurysm or dissection on this nongated examination. No evidence of central pulmonary embolism. 2. 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 Abdominal and pelvic CTA impression: 1. Minimal amount of atherosclerotic plaque with a normal caliber abdominal aorta, not resulting in a  hemodynamically significant stenosis. Aortic Atherosclerosis (ICD10-I70.0). 2. No acute findings within the abdomen or pelvis. 3. Severe DDD of L4-L5. Electronically Signed   By: Sandi Mariscal M.D.   On: 08/19/2017 14:49    Procedures Procedures (including critical care time)  Medications Ordered in ED Medications  iopamidol (ISOVUE-370) 76 % injection (has no administration in time range)  aspirin chewable tablet 243 mg (243 mg Oral Given 08/19/17 1404)  gi cocktail (Maalox,Lidocaine,Donnatal) (30 mLs Oral Given 08/19/17 1404)  iopamidol (ISOVUE-370) 76 % injection 100 mL (100 mLs Intravenous Contrast Given 08/19/17 1422)     Initial Impression / Assessment and Plan / ED Course  I have reviewed the triage vital signs and the nursing notes.  Pertinent labs & imaging results that were available during my care of the patient were reviewed by me and considered in my medical decision making (see chart for details).     67 y.o. female here with upper back pain intermittently x1 day, some radiation into central chest. On exam, no reproducible tenderness to spine/back or chest, no tachycardia or hypoxia, clear lungs, BP initially elevated at 183/97 but on recheck in the room it's 159/96, no pedal edema or calf tenderness. Extremities NVI with soft compartments. No abdominal  tenderness. Work up thus far reveals: EKG nonischemic; trop neg; BMP essentially WNL; CBC WNL; CXR neg. DDx includes GERD/indigestion vs dissection, etc. Will proceed with CTA to confirm no dissection/PE/etc, and do second trop at 3hrs post-first one, will give ASA 22m since she already took 836m and GI cocktail. Will reassess shortly. Discussed case with my attending Dr. ZaRoderic Palauho agrees with plan.   4:45 PM CTA chest/abd/pelv showing no PE or dissection, but shows consolidative opacities in RML and lingula with associated bronchiectasis, suggestive of chronic atypical infection; will empirically treat with levaquin which would cover for atypicals. Second trop neg. Pt feeling better, BP continues to come down from initial value (150s/90s on recheck). Overall reassuring work up, doubt need for further emergent work up at this time. Will send home with levaquin, advised adequate hydration/rest, tylenol/motrin for pain, GERD tx, additional OTC remedies for symptomatic relief, and f/up with PCP in 1wk for recheck of symptoms. Advised DASH diet and avoidance of things that would make her BP go up, keep log of BPs, and f/up with PCP for this. I explained the diagnosis and have given explicit precautions to return to the ER including for any other new or worsening symptoms. The patient understands and accepts the medical plan as it's been dictated and I have answered their questions. Discharge instructions concerning home care and prescriptions have been given. The patient is STABLE and is discharged to home in good condition.    Final Clinical Impressions(s) / ED Diagnoses   Final diagnoses:  Precordial chest pain  Upper back pain  Community acquired pneumonia of right middle lobe of lung [place] Hx of gastroesophageal reflux (GERD)  Essential hypertension    ED Discharge Orders        Ordered    levofloxacin (LEVAQUIN) 750 MG tablet  Daily     08/19/17 1691 Catherine CourtMeMelmore PAVermont8/03/19 1646    ZaMilton FergusonMD 08/20/17 [id number]

## 2017-08-19 NOTE — ED Triage Notes (Signed)
Pt reports discomfort between shoulder blades that began around 19:00 yesterday. Pt describes the pain as intermittent pressure. Pt denies pain in triage.  Pt reports hx of indigestion.  Pt also denies n/v or SOB.

## 2017-08-21 ENCOUNTER — Telehealth: Payer: Self-pay | Admitting: Internal Medicine

## 2017-08-21 MED ORDER — DOXYCYCLINE HYCLATE 100 MG PO TABS: 100.0000 mg | ORAL_TABLET | Freq: Two times a day (BID) | ORAL | 0 refills | Status: DC

## 2017-08-21 NOTE — Telephone Encounter (Signed)
Doxycycline 100 mg twice daily x one week

## 2017-08-21 NOTE — Telephone Encounter (Signed)
Called spoke with patient and advised of MW's recommendations as stated below. Rx sent to verified pharmacy. Patient aware to keep 8.7.19 appt with Kenney Houseman NP.

## 2017-08-21 NOTE — Telephone Encounter (Signed)
Patient seen in the ED 8.3.19 Per PA in the ED: CTA chest/abd/pelv showing no PE or dissection, but shows consolidative opacities in RML and lingula with associated bronchiectasis, suggestive of chronic atypical infection; will empirically treat with levaquin which would cover for atypicals.   Follow up in 1 week w/ PCP was recommended to patient (MW is listed as the PCP) Patient is scheduled to see Kenney Houseman NP on 8.7.19  Called spoke with patient who reported that she spoke with a friend who told her that she knows 2 other people that snapped a tendon with the Levaquin and she is now afraid to continue taking it.  Patient only took 1 dose.  Patient denies that SHE experienced any symptoms.  Patient would like to know if she can switch antibiotics. [facility] Allergies  Allergen Reactions  . Penicillins     unknown  . Nickel Rash     Dr Melvyn Novas please advise, thank you

## 2017-08-23 ENCOUNTER — Other Ambulatory Visit (INDEPENDENT_AMBULATORY_CARE_PROVIDER_SITE_OTHER): Payer: Medicare Other

## 2017-08-23 ENCOUNTER — Ambulatory Visit (INDEPENDENT_AMBULATORY_CARE_PROVIDER_SITE_OTHER): Payer: Medicare Other | Admitting: Nurse Practitioner

## 2017-08-23 ENCOUNTER — Encounter: Payer: Self-pay | Admitting: Nurse Practitioner

## 2017-08-23 VITALS — BP 126/76 | HR 81 | Ht 64.0 in | Wt 160.2 lb

## 2017-08-23 DIAGNOSIS — J181 Lobar pneumonia, unspecified organism: Secondary | ICD-10-CM | POA: Diagnosis not present

## 2017-08-23 DIAGNOSIS — E039 Hypothyroidism, unspecified: Secondary | ICD-10-CM | POA: Diagnosis not present

## 2017-08-23 DIAGNOSIS — E871 Hypo-osmolality and hyponatremia: Secondary | ICD-10-CM | POA: Diagnosis not present

## 2017-08-23 LAB — COMPREHENSIVE METABOLIC PANEL
ALT: 30 U/L (ref 0–35)
AST: 34 U/L (ref 0–37)
Albumin: 4.7 g/dL (ref 3.5–5.2)
Alkaline Phosphatase: 60 U/L (ref 39–117)
BILIRUBIN TOTAL: 0.7 mg/dL (ref 0.2–1.2)
BUN: 12 mg/dL (ref 6–23)
CO2: 30 meq/L (ref 19–32)
Calcium: 9.7 mg/dL (ref 8.4–10.5)
Chloride: 90 mEq/L — ABNORMAL LOW (ref 96–112)
Creatinine, Ser: 0.8 mg/dL (ref 0.40–1.20)
GFR: 75.53 mL/min (ref >60.00)
GLUCOSE: 117 mg/dL — AB (ref 70–99)
POTASSIUM: 4.1 meq/L (ref 3.5–5.1)
SODIUM: 126 meq/L — AB (ref 135–145)
TOTAL PROTEIN: 7.7 g/dL (ref 6.0–8.3)

## 2017-08-23 LAB — TSH: TSH: 19.9 u[IU]/mL — ABNORMAL HIGH (ref 0.35–4.50)

## 2017-08-23 MED ORDER — FUROSEMIDE 20 MG PO TABS: 20.0000 mg | ORAL_TABLET | Freq: Every day | ORAL | 0 refills | Status: DC

## 2017-08-23 MED ORDER — LEVOTHYROXINE SODIUM 75 MCG PO TABS
75.0000 ug | ORAL_TABLET | Freq: Every day | ORAL | 1 refills | Status: DC
Start: 2017-08-23 — End: 2017-10-06

## 2017-08-23 NOTE — Progress Notes (Signed)
'@Patient'  ID: [name], female    DOB: Aug 01, 1950, 67 y.o.   MRN: [id number]  Chief Complaint  Patient presents with  . Follow-up    Seen in ER Saturday due to pain in upper back. Was told she had lung infection.    Referring provider: [name], MD  67 year old patient being followed by Dr. Melvyn Novas. Past health history includes former smoker (quit 2003), HTN, and ? rml syndrome  HPI: Patient is being seen today for a hospital follow up and follow up on hypothyroidism. She was evaluated in the ED at [facility] on 08-19-17 and diagnosed with Right mid lobe PNA found on chest CT. She was started on levaquin, but called the office on 08-21-17 to request a change in antibiotic because she felt that the Levaquin was too harsh. Doxycycline was ordered on 08-21-17.   Note: Patient was seen by GI in June. TSH was checked and found to be elevated. At that time Dr. Melvyn Novas increased levothyroxine from 25 mcg to 50 mcg. Follow up visit was made for today for recheck. Patient states that she has more energy has felt much better since the medication change.    Pneumonia  She complains of cough. There is no hemoptysis, shortness of breath or wheezing. The current episode started 1 to 4 weeks ago. The problem has been gradually improving. The cough is dry. Pertinent negatives include no appetite change, chest pain, dyspnea on exertion, fever, nasal congestion, postnasal drip or sore throat. Her symptoms are aggravated by exposure to fumes and exposure to smoke. Relieved by: doxycycline. She reports moderate improvement on treatment.     Recent Encounters:   ED NOTE 08-19-17 CTA chest/abd/pelv showing no PE or dissection, but shows consolidative opacities in RML and lingula with associated bronchiectasis, suggestive of chronic atypical infection; will empirically treat with levaquin which would cover for atypicals. Second trop neg. Pt feeling better, BP continues to come down from initial value (150s/90s  on recheck). Overall reassuring work up, doubt need for further emergent work up at this time. Will send home with levaquin, advised adequate hydration/rest, tylenol/motrin for pain, GERD tx, additional OTC remedies for symptomatic relief, and f/up with PCP in 1wk for recheck of symptoms. Advised DASH diet and avoidance of things that would make her BP go up, keep log of BPs, and f/up with PCP for this. I explained the diagnosis and have given explicit precautions to return to the ER including for any other new or worsening symptoms. The patient understands and accepts the medical plan as it's been dictated and I have answered their questions. Discharge instructions concerning home care and prescriptions have been given. The patient is STABLE and is discharged to home in good condition.      Allergies  Allergen Reactions  . Penicillins     unknown  . Nickel Rash    Immunization History  Administered Date(s) Administered  . Influenza, High Dose Seasonal PF 10/21/2016  . Pneumococcal Conjugate-13 02/12/2014  . Td 06/26/2008    Past Medical History:  Diagnosis Date  . Anxiety   . Cancer ([place])    basal skin cancer   . DJD (degenerative joint disease) of hip    right - [place]  . Dyslipidemia   . GERD (gastroesophageal reflux disease)   . Glaucoma (increased eye pressure)   . Hypertension   . Hypothyroidism   . Osteopenia   . Pneumonia    hx of pneumonia - lst time 1998  .  PONV (postoperative nausea and vomiting)   . Vitreous floaters of left eye 04/2013    Tobacco History: Social History   Tobacco Use  Smoking Status Former Smoker  . Packs/day: 0.50  . Years: 20.00  . Pack years: 10.00  . Types: Cigarettes  . Last attempt to quit: 01/17/2001  . Years since quitting: 16.6  Smokeless Tobacco Never Used   Counseling given: former smoker   Outpatient Encounter Medications as of 08/23/2017  Medication Sig  . aspirin 81 MG tablet Take 81 mg by mouth daily.  . calcium  carbonate (TUMS EX) 750 MG chewable tablet Chew 1 tablet by mouth as needed for heartburn.  . diphenhydrAMINE (BENADRYL) 25 MG tablet Take 25 mg by mouth at bedtime as needed for sleep.   Marland Kitchen doxycycline (VIBRA-TABS) 100 MG tablet Take 1 tablet (100 mg total) by mouth 2 (two) times daily.  . hydroxypropyl methylcellulose (ISOPTO TEARS) 2.5 % ophthalmic solution Place 1 drop into both eyes 3 (three) times daily as needed for dry eyes.  Marland Kitchen levothyroxine (SYNTHROID, LEVOTHROID) 50 MCG tablet Take 50 mcg by mouth daily before breakfast.  . loperamide (IMODIUM A-D) 2 MG tablet Take 2 mg by mouth as needed for diarrhea or loose stools.  . Multiple Vitamin (MULTIVITAMIN) capsule Take 1 capsule by mouth every other day.  . Olopatadine HCl 0.2 % SOLN   . Omega-3 Fatty Acids (FISH OIL) 1000 MG CAPS Take by mouth every other day.  Marland Kitchen omeprazole (PRILOSEC) 40 MG capsule Take 1 tab by mouth daily before breakfast.  . OVER THE COUNTER MEDICATION [employer] brand sugar free cough drops as needed  . Probiotic CAPS Take 1 capsule by mouth daily.  . ranitidine (ZANTAC) 150 MG tablet Take 150 mg by mouth daily as needed for heartburn.   . sodium chloride (OCEAN) 0.65 % nasal spray Place 1 spray into the nose 2 (two) times daily as needed for congestion.   . Travoprost, BAK Free, (TRAVATAN) 0.004 % SOLN ophthalmic solution Place 1 drop into both eyes at bedtime.  . triamterene-hydrochlorothiazide (MAXZIDE-25) 37.5-25 MG tablet TAKE 1 TABLET BY MOUTH EVERY DAY  . Vitamin D, Cholecalciferol, 400 units CAPS Take by mouth once a week.  . [DISCONTINUED] levofloxacin (LEVAQUIN) 750 MG tablet Take 1 tablet (750 mg total) by mouth daily. X 7 days (Patient not taking: Reported on 08/23/2017)  . [DISCONTINUED] levothyroxine (SYNTHROID, LEVOTHROID) 25 MCG tablet Take 1 tablet (25 mcg total) by mouth daily before breakfast. (Patient not taking: Reported on 08/23/2017)  . [DISCONTINUED] omeprazole (PRILOSEC) 20 MG capsule Take 40 mg by mouth  daily.    No facility-administered encounter medications on file as of 08/23/2017.      Review of Systems  Review of Systems  Constitutional: Negative.  Negative for appetite change and fever.  HENT: Negative.  Negative for postnasal drip and sore throat.   Respiratory: Positive for cough. Negative for hemoptysis, chest tightness, shortness of breath and wheezing.   Cardiovascular: Negative.  Negative for chest pain and dyspnea on exertion.  Gastrointestinal: Negative.   Allergic/Immunologic: Negative.   Neurological: Negative.   Psychiatric/Behavioral: Negative.        Physical Exam  BP 126/76 (BP Location: Left Arm, Patient Position: Sitting, Cuff Size: Normal)   Pulse 81   Ht '5\' 4"'  (1.626 m)   Wt 160 lb 3.2 oz (72.7 kg)   LMP 01/17/2001 (Approximate)   SpO2 96%   BMI 27.50 kg/m   Wt Readings from Last 5 Encounters:  08/23/17 160 lb 3.2 oz (72.7 kg)  08/19/17 160 lb (72.6 kg)  07/13/17 160 lb (72.6 kg)  10/21/16 156 lb (70.8 kg)  07/14/16 162 lb 6.4 oz (73.7 kg)     Physical Exam  Constitutional: She is oriented to person, place, and time. She appears well-developed and well-nourished. No distress.  Neck: No thyroid mass and no thyromegaly present.  Cardiovascular: Normal rate and regular rhythm.  Pulmonary/Chest: Effort normal and breath sounds normal.  Neurological: She is alert and oriented to person, place, and time.  Psychiatric: She has a normal mood and affect.  Nursing note and vitals reviewed.    Lab Results:  CBC    Component Value Date/Time   WBC 5.3 08/19/2017 1148   RBC 4.63 08/19/2017 1148   HGB 14.4 08/19/2017 1148   HCT 41.4 08/19/2017 1148   PLT 225 08/19/2017 1148   MCV 89.4 08/19/2017 1148   MCH 31.1 08/19/2017 1148   MCHC 34.8 08/19/2017 1148   RDW 13.4 08/19/2017 1148   LYMPHSABS 1.0 07/13/2017 1032   MONOABS 0.5 07/13/2017 1032   EOSABS 0.1 07/13/2017 1032   BASOSABS 0.0 07/13/2017 1032    BMET    Component Value  Date/Time   NA 133 (L) 08/19/2017 1148   K 3.8 08/19/2017 1148   CL 94 (L) 08/19/2017 1148   CO2 28 08/19/2017 1148   GLUCOSE 117 (H) 08/19/2017 1148   BUN 11 08/19/2017 1148   CREATININE 0.70 08/19/2017 1148   CALCIUM 9.3 08/19/2017 1148   GFRNONAA >60 08/19/2017 1148   GFRAA >60 08/19/2017 1148   Lab Results  Component Value Date   TSH 26.71 (H) 07/13/2017    BNP No results found for: BNP  ProBNP No results found for: PROBNP  Imaging: Dg Chest 2 View  Result Date: 08/19/2017 CLINICAL DATA:  Pt reports discomfort between shoulder blades that began around 19:00 yesterday. Pt describes the pain as intermittent pressure. Hx of HTN EXAM: CHEST - 2 VIEW COMPARISON:  Chest x-ray dated 07/14/2016. FINDINGS: Heart size and mediastinal contours are within normal limits. Subtle opacities within each lung are stable, compatible with previous description of chronic scarring/fibrosis. No new lung findings. No pleural effusion or pneumothorax seen. No acute or suspicious osseous finding. IMPRESSION: No active cardiopulmonary disease. No evidence of pneumonia or pulmonary edema. Electronically Signed   By: Franki Cabot M.D.   On: 08/19/2017 11:51   Ct Angio Chest/abd/pel For Dissection W And/or Wo Contrast  Result Date: 08/19/2017 CLINICAL DATA:  Superior back pain. Evaluate for thoracic dissection and/or pulmonary embolism. EXAM: CT ANGIOGRAPHY CHEST, ABDOMEN AND PELVIS TECHNIQUE: Multidetector CT imaging through the chest, abdomen and pelvis was performed using the standard protocol during bolus administration of intravenous contrast. Multiplanar reconstructed images and MIPs were obtained and reviewed to evaluate the vascular anatomy. CONTRAST:  145m ISOVUE-370 IOPAMIDOL (ISOVUE-370) INJECTION 76% COMPARISON:  Chest radiograph-earlier same day FINDINGS: CTA CHEST FINDINGS Vascular Findings: No evidence of thoracic aortic aneurysm or dissection on this nongated examination. Review of the  precontrast images is negative for the presence of an intramural hematoma. Normal heart size.  No pericardial effusion. Although this examination was not tailored for the evaluation the pulmonary arteries, there are no discrete filling defects within the central pulmonary arterial tree to suggest central pulmonary embolism. Normal caliber of the main pulmonary artery. ------------------------------------------------------------- Thoracic aortic measurements: Sinotubular junction 34 mm as measured in greatest oblique coronal dimension. Proximal ascending aorta 31 mm as measured in greatest oblique axial  dimension at the level of the main pulmonary artery. Aortic arch aorta 26 mm as measured in greatest oblique sagittal dimension. Proximal descending thoracic aorta 23 mm as measured in greatest oblique axial dimension at the level of the main pulmonary artery. Distal descending thoracic aorta 23 mm as measured in greatest oblique axial dimension at the level of the diaphragmatic hiatus. Review of the MIP images confirms the above findings. ------------------------------------------------------------- Non-Vascular Findings: Mediastinum/Lymph Nodes: No mediastinal, hilar or axillary lymphadenopathy. Lungs/Pleura: Consolidative opacities with associated bronchiectasis within the right middle lobe and lingula (representative images 89, series 6). There are additional areas of bronchiectasis within the right upper lobe (representative image 49, series 6). Minimal architectural distortion and scarring within medial aspect of the right lung apex adjacent to the superior aspect of the right-side of the mediastinum. Punctate granuloma within the right upper lobe (image 32, series 6). No pleural effusion or pneumothorax. Musculoskeletal: Stigmata of DISH within the thoracic spine. Degenerative change C6-C7 with disc space height loss endplate irregularity and sclerosis. Regional soft tissues appear normal. Normal appearance of  the thyroid gland. _________________________________________________________ CTA ABDOMEN AND PELVIS FINDINGS VASCULAR Aorta: Minimal amount of predominantly calcified atherosclerotic plaque with a normal caliber abdominal aorta, not resulting in a hemodynamically significant stenosis. No abdominal aortic dissection or periaortic stranding. Celiac: There is a minimal amount of calcified atherosclerotic plaque involving the origin of the celiac artery, not definitely resulting in hemodynamically significant stenosis. SMA: Widely patent without hemodynamically significant stenosis. A replaced right hepatic artery arises from the proximal SMA. The distal tributaries of the SMA appear widely patent without discrete intraluminal filling defect to suggest distal embolism. Renals: The bilateral renal arteries appear widely patent. IMA: Widely patent. Inflow: There is a minimal amount of mixed calcified and noncalcified atherosclerotic plaque within the bilateral normal caliber common and internal iliac arteries, not resulting in hemodynamically significant stenosis. The bilateral external iliac arteries are tortuous though of normal caliber and widely patent without hemodynamically significant narrowing. Minimal amount of mixed calcified and noncalcified atherosclerotic plaque within the bilateral common femoral arteries, not resulting in hemodynamically significant stenosis. Veins: The IVC and pelvic venous system appear widely patent on this arterial phase examination. Review of the MIP images confirms the above findings. NON-VASCULAR Evaluation of the abdominal organs is limited to the arterial phase of enhancement. Hepatobiliary: Normal hepatic contour. There is a ill-defined punctate (subcentimeter) area of hyperenhancement involving the caudal aspect the right lobe liver (image 129, series 5, favored to represent a perfusion abnormality. Otherwise, no discrete hyperenhancing hepatic lesions. Normal appearance of the  gallbladder given degree distention. No radiopaque gallstones. No intra extrahepatic biliary duct dilatation. No ascites. Pancreas: Normal appearance of the pancreas Spleen: Normal appearance of the spleen Adrenals/Urinary Tract: There is symmetric enhancement of the bilateral kidneys. No definite renal stones on this postcontrast examination. No discrete renal lesions. No urine obstruction or perinephric stranding. Normal appearance the bilateral adrenal glands. Normal appearance of the urinary bladder given degree distention, however note, evaluation degraded secondary streak artifact from patient's right total hip prosthesis. Stomach/Bowel: Moderate colonic stool burden without evidence of enteric obstruction. The cecum is located within the right mid hemiabdomen. Normal appearance of the terminal ileum and retrocecal appendix. No discrete areas of bowel wall thickening. No pneumoperitoneum, pneumatosis or portal venous gas. Lymphatic: No bulky retroperitoneal, mesenteric, pelvic or inguinal lymphadenopathy. Reproductive: Normal appearance of the pelvic organs for age. No free fluid the pelvic cul-de-sac. Other: Regional soft tissues appear normal. Musculoskeletal: No acute or  aggressive osseous abnormalities. Post right total hip replacement, incompletely evaluated. Severe DDD of L4-L5 with near complete disc space height loss, endplate irregularity and sclerosis. Review of the MIP images confirms the above findings. IMPRESSION: Chest CTA impression: 1. No acute cardiopulmonary disease. Specifically, no evidence of thoracic aortic aneurysm or dissection on this nongated examination. No evidence of central pulmonary embolism. 2. 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 Abdominal and pelvic CTA impression: 1. Minimal amount of atherosclerotic plaque with a normal caliber abdominal aorta, not  resulting in a hemodynamically significant stenosis. Aortic Atherosclerosis (ICD10-I70.0). 2. No acute findings within the abdomen or pelvis. 3. Severe DDD of L4-L5. Electronically Signed   By: Sandi Mariscal M.D.   On: 08/19/2017 14:49     Assessment & Plan:   Lobar pneumonia, unspecified organism [facility])   Patient Instructions  Finish entire course of doxycycline Stay well hydrated May use OTC cough drops as needed Please call if symptoms worsen    Hypothyroidism Will recheck TSH and CMP today - abnormal at last visit Will call with results Will schedule 6 month follow up with Dr. Shary Key, NP 08/23/2017

## 2017-08-23 NOTE — Patient Instructions (Signed)
Finish entire course of doxycycline Stay well hydrated May use OTC cough drops as needed Will recheck TSH and CMP today - abnormal at last visit Will call with results Will schedule 6 month follow up with Dr. Melvyn Novas Please call if symptoms worsen

## 2017-08-23 NOTE — Assessment & Plan Note (Signed)
  Patient Instructions  [name] entire course of doxycycline Stay well hydrated May use OTC cough drops as needed Please call if symptoms worsen

## 2017-08-23 NOTE — Assessment & Plan Note (Signed)
Will recheck TSH and CMP today - abnormal at last visit Will call with results Will schedule 6 month follow up with Dr. Melvyn Novas

## 2017-08-23 NOTE — Progress Notes (Signed)
Chart and office note reviewed in detail  > agree with a/p as outlined    

## 2017-08-23 NOTE — Addendum Note (Signed)
Addended by: Fenton Foy on: 08/23/2017 04:55 PM   Modules accepted: Orders

## 2017-08-24 NOTE — Progress Notes (Signed)
Chart and office note reviewed in detail  > agree with a/p as outlined    

## 2017-08-25 ENCOUNTER — Telehealth: Payer: Self-pay | Admitting: Internal Medicine

## 2017-08-25 NOTE — Telephone Encounter (Signed)
Spoke with pt, she states she takes her levothyroxine on an empty stomach and had previously taken the doxycycline on an empty stomach and she has not experienced nausea/vomiting. This morning after she took the Doxy she vomited but I advised her that Doxy should not be taken on an empty stomach. She states she is supposed to take the Doxy every 12 hours but wanted to see if she could wait to take it with lunch/food and then again in the afternoon. Will this be ok? Please advise MW.

## 2017-08-25 NOTE — Telephone Encounter (Signed)
Called pt and advised message from the provider. Pt understood and verbalized understanding. Nothing further is needed.    

## 2017-08-25 NOTE — Telephone Encounter (Signed)
That's fine - normally I rec take it with large glass of water right when sit down to eat/ no milk or maalox type antacids with it though

## 2017-08-31 ENCOUNTER — Other Ambulatory Visit (INDEPENDENT_AMBULATORY_CARE_PROVIDER_SITE_OTHER): Payer: Medicare Other

## 2017-08-31 DIAGNOSIS — J181 Lobar pneumonia, unspecified organism: Secondary | ICD-10-CM

## 2017-08-31 DIAGNOSIS — E871 Hypo-osmolality and hyponatremia: Secondary | ICD-10-CM | POA: Diagnosis not present

## 2017-08-31 DIAGNOSIS — E039 Hypothyroidism, unspecified: Secondary | ICD-10-CM | POA: Diagnosis not present

## 2017-08-31 LAB — BASIC METABOLIC PANEL
BUN: 15 mg/dL (ref 6–23)
CHLORIDE: 95 meq/L — AB (ref 96–112)
CO2: 32 mEq/L (ref 19–32)
CREATININE: 0.82 mg/dL (ref 0.40–1.20)
Calcium: 9.7 mg/dL (ref 8.4–10.5)
GFR: 73.4 mL/min (ref >60.00)
Glucose, Bld: 96 mg/dL (ref 70–99)
POTASSIUM: 3.7 meq/L (ref 3.5–5.1)
Sodium: 134 mEq/L — ABNORMAL LOW (ref 135–145)

## 2017-09-01 NOTE — Addendum Note (Signed)
Addended by: Nena Polio on: 09/01/2017 09:28 AM   Modules accepted: Orders

## 2017-09-19 ENCOUNTER — Other Ambulatory Visit: Payer: Self-pay | Admitting: Internal Medicine

## 2017-09-19 DIAGNOSIS — E871 Hypo-osmolality and hyponatremia: Secondary | ICD-10-CM

## 2017-09-21 ENCOUNTER — Ambulatory Visit (AMBULATORY_SURGERY_CENTER): Payer: Medicare Other | Admitting: Internal Medicine

## 2017-09-21 ENCOUNTER — Encounter: Payer: Self-pay | Admitting: Internal Medicine

## 2017-09-21 VITALS — BP 139/78 | HR 78 | Temp 99.1°F | Resp 16 | Ht 64.5 in | Wt 160.0 lb

## 2017-09-21 DIAGNOSIS — K219 Gastro-esophageal reflux disease without esophagitis: Secondary | ICD-10-CM | POA: Diagnosis not present

## 2017-09-21 DIAGNOSIS — Z1211 Encounter for screening for malignant neoplasm of colon: Secondary | ICD-10-CM

## 2017-09-21 DIAGNOSIS — E039 Hypothyroidism, unspecified: Secondary | ICD-10-CM | POA: Diagnosis not present

## 2017-09-21 DIAGNOSIS — K317 Polyp of stomach and duodenum: Secondary | ICD-10-CM

## 2017-09-21 DIAGNOSIS — I10 Essential (primary) hypertension: Secondary | ICD-10-CM | POA: Diagnosis not present

## 2017-09-21 MED ORDER — SODIUM CHLORIDE 0.9 % IV SOLN
500.0000 mL | Freq: Once | INTRAVENOUS | Status: DC
Start: 2017-09-21 — End: 2017-09-21

## 2017-09-21 NOTE — Op Note (Signed)
[place] Patient Name: [name] Procedure Date: 09/21/2017 1:35 PM MRN: [id number] Endoscopist: Gatha Mayer , MD Age: 67 Referring MD:  Date of Birth: 09/30/1950 Gender: Female Account #: [id number] Procedure:                Upper GI endoscopy Indications:              Suspected esophageal reflux, Chronic cough Medicines:                Propofol per Anesthesia, Monitored Anesthesia Care Procedure:                After obtaining informed consent, the endoscope was                            passed under direct vision. Throughout the                            procedure, the patient's blood pressure, pulse, and                            oxygen saturations were monitored continuously. The                            Endoscope was introduced through the mouth, and                            advanced to the second part of duodenum. The upper                            GI endoscopy was accomplished without difficulty.                            The patient tolerated the procedure well. Scope In: Scope Out: Findings:                 The esophagus was normal.                           The examined duodenum was normal.                           A few diminutive sessile polyps with no stigmata of                            recent bleeding were found in the gastric fundus. Complications:            No immediate complications. Estimated Blood Loss:     Estimated blood loss: none. Impression:               - Normal esophagus.                           - Normal examined duodenum.                           - A few gastric polyps. Diminutive and classic  appearance of benign fundic gland polyps                           - No specimens collected. Recommendation:           - She will see if she can stop omeprazole                           she says cough went away with antibiotics Gatha Mayer, MD 09/21/2017 2:21:38 PM This report has been signed  electronically.

## 2017-09-21 NOTE — Progress Notes (Signed)
Report given to PACU, vss 

## 2017-09-21 NOTE — Op Note (Signed)
[place] Patient Name: [name] Procedure Date: 09/21/2017 1:35 PM MRN: [id number] Endoscopist: Gatha Mayer , MD Age: 67 Referring MD:  Date of Birth: Jul 30, 1950 Gender: Female Account #: [id number] Procedure:                Colonoscopy Indications:              Screening for colorectal malignant neoplasm, Last                            colonoscopy: 2006 Medicines:                Propofol per Anesthesia, Monitored Anesthesia Care Procedure:                Pre-Anesthesia Assessment:                           - Prior to the procedure, a History and Physical                            was performed, and patient medications and                            allergies were reviewed. The patient's tolerance of                            previous anesthesia was also reviewed. The risks                            and benefits of the procedure and the sedation                            options and risks were discussed with the patient.                            All questions were answered, and informed consent                            was obtained. Prior Anticoagulants: The patient has                            taken no previous anticoagulant or antiplatelet                            agents. ASA Grade Assessment: II - A patient with                            mild systemic disease. After reviewing the risks                            and benefits, the patient was deemed in                            satisfactory condition to undergo the procedure.  After obtaining informed consent, the colonoscope                            was passed under direct vision. Throughout the                            procedure, the patient's blood pressure, pulse, and                            oxygen saturations were monitored continuously. The                            Colonoscope was introduced through the anus and                            advanced to the  the cecum, identified by                            appendiceal orifice and ileocecal valve. The                            quality of the bowel preparation was excellent. The                            colonoscopy was performed without difficulty. The                            patient tolerated the procedure well. The bowel                            preparation used was Miralax. Scope In: 1:55:17 PM Scope Out: 2:09:14 PM Scope Withdrawal Time: 0 hours 10 minutes 3 seconds  Total Procedure Duration: 0 hours 13 minutes 57 seconds  Findings:                 The perianal and digital rectal examinations were                            normal.                           The colon (entire examined portion) appeared normal.                           No additional abnormalities were found on                            retroflexion. Complications:            No immediate complications. Estimated blood loss:                            None. Estimated Blood Loss:     Estimated blood loss: none. Recommendation:           - Patient has a contact number available for  emergencies. The signs and symptoms of potential                            delayed complications were discussed with the                            patient. Return to normal activities tomorrow.                            Written discharge instructions were provided to the                            patient.                           - Resume previous diet.                           - Continue present medications.                           - No repeat colonoscopy due to current age (32                            years or older). Gatha Mayer, MD 09/21/2017 2:23:40 PM This report has been signed electronically.

## 2017-09-21 NOTE — Patient Instructions (Addendum)
Both testes normal.  You can see if you can stop omeprazole.  You do not need any more routine colon cancer screening tests.  I appreciate the opportunity to care for you. Gatha Mayer, MD, FACG YOU HAD AN ENDOSCOPIC PROCEDURE TODAY AT [facility]:   Refer to the procedure report that was given to you for any specific questions about what was found during the examination.  If the procedure report does not answer your questions, please call your gastroenterologist to clarify.  If you requested that your care partner not be given the details of your procedure findings, then the procedure report has been included in a sealed envelope for you to review at your convenience later.  YOU SHOULD EXPECT: Some feelings of bloating in the abdomen. Passage of more gas than usual.  Walking can help get rid of the air that was put into your GI tract during the procedure and reduce the bloating. If you had a lower endoscopy (such as a colonoscopy or flexible sigmoidoscopy) you may notice spotting of blood in your stool or on the toilet paper. If you underwent a bowel prep for your procedure, you may not have a normal bowel movement for a few days.  Please Note:  You might notice some irritation and congestion in your nose or some drainage.  This is from the oxygen used during your procedure.  There is no need for concern and it should clear up in a day or so.  SYMPTOMS TO REPORT IMMEDIATELY:   Following lower endoscopy (colonoscopy or flexible sigmoidoscopy):  Excessive amounts of blood in the stool  Significant tenderness or worsening of abdominal pains  Swelling of the abdomen that is new, acute  Fever of 100F or higher   Following upper endoscopy (EGD)  Vomiting of blood or coffee ground material  New chest pain or pain under the shoulder blades  Painful or persistently difficult swallowing  New shortness of breath  Fever of 100F or higher  Black, tarry-looking  stools  For urgent or emergent issues, a gastroenterologist can be reached at any hour by calling [phone].   DIET:  We do recommend a small meal at first, but then you may proceed to your regular diet.  Drink plenty of fluids but you should avoid alcoholic beverages for 24 hours.  MEDICATION: Continue present medications. Patient will attempt to stop Omeprazole and see how she does.  No repeat colonoscopy due to age.  ACTIVITY:  You should plan to take it easy for the rest of today and you should NOT DRIVE or use heavy machinery until tomorrow (because of the sedation medicines used during the test).    FOLLOW UP: Our staff will call the number listed on your records the next business day following your procedure to check on you and address any questions or concerns that you may have regarding the information given to you following your procedure. If we do not reach you, we will leave a message.  However, if you are feeling well and you are not experiencing any problems, there is no need to return our call.  We will assume that you have returned to your regular daily activities without incident.  If any biopsies were taken you will be contacted by phone or by letter within the next 1-3 weeks.  Please call us at [phone] if you have not heard about the biopsies in 3 weeks.   Thank you for allowing [place] to provide for  your healthcare needs today.  SIGNATURES/CONFIDENTIALITY: You and/or your care partner have signed paperwork which will be entered into your electronic medical record.  These signatures attest to the fact that that the information above on your After Visit Summary has been reviewed and is understood.  Full responsibility of the confidentiality of this discharge information lies with you and/or your care-partner.

## 2017-09-21 NOTE — Progress Notes (Signed)
Pt's states no medical or surgical changes since previsit or office visit. 

## 2017-09-22 ENCOUNTER — Telehealth: Payer: Self-pay | Admitting: *Deleted

## 2017-09-22 NOTE — Telephone Encounter (Signed)
  Follow up Call-  Call back number 09/21/2017  Post procedure Call Back phone  # [phone]  Permission to leave phone message Yes  Some recent data might be hidden     Patient questions:  Do you have a fever, pain , or abdominal swelling? No. Pain Score  0 *  Have you tolerated food without any problems? Yes.    Have you been able to return to your normal activities? Yes.    Do you have any questions about your discharge instructions: Diet   No. Medications  No. Follow up visit  No.  Do you have questions or concerns about your Care? No.  Actions: * If pain score is 4 or above: No action needed, pain <4.

## 2017-10-04 ENCOUNTER — Other Ambulatory Visit (INDEPENDENT_AMBULATORY_CARE_PROVIDER_SITE_OTHER): Payer: Medicare Other

## 2017-10-04 DIAGNOSIS — E039 Hypothyroidism, unspecified: Secondary | ICD-10-CM

## 2017-10-04 LAB — BASIC METABOLIC PANEL
BUN: 16 mg/dL (ref 6–23)
CO2: 34 mEq/L — ABNORMAL HIGH (ref 19–32)
Calcium: 9.9 mg/dL (ref 8.4–10.5)
Chloride: 96 mEq/L (ref 96–112)
Creatinine, Ser: 0.94 mg/dL (ref 0.40–1.20)
GFR: 62.68 mL/min (ref >60.00)
Glucose, Bld: 101 mg/dL — ABNORMAL HIGH (ref 70–99)
POTASSIUM: 3.7 meq/L (ref 3.5–5.1)
Sodium: 137 mEq/L (ref 135–145)

## 2017-10-04 LAB — TSH: TSH: 12.63 u[IU]/mL — AB (ref 0.35–4.50)

## 2017-10-06 ENCOUNTER — Other Ambulatory Visit: Payer: Self-pay | Admitting: Nurse Practitioner

## 2017-10-06 DIAGNOSIS — E039 Hypothyroidism, unspecified: Secondary | ICD-10-CM

## 2017-10-06 MED ORDER — LEVOTHYROXINE SODIUM 100 MCG PO TABS: 100.0000 ug | ORAL_TABLET | Freq: Every day | ORAL | 1 refills | Status: DC

## 2017-10-13 ENCOUNTER — Other Ambulatory Visit: Payer: Self-pay | Admitting: Internal Medicine

## 2017-10-13 DIAGNOSIS — E871 Hypo-osmolality and hyponatremia: Secondary | ICD-10-CM

## 2017-10-30 ENCOUNTER — Other Ambulatory Visit: Payer: Self-pay | Admitting: Internal Medicine

## 2017-11-02 ENCOUNTER — Telehealth: Payer: Self-pay | Admitting: Internal Medicine

## 2017-11-02 NOTE — Telephone Encounter (Signed)
Spoke with the pt  She states she went to get her synthroid and there were 2 different rxs- 1 for 100 mcg and 1 for 25 mcg  I advised according to recent labs she needs to 100 mcg  I have removed the 25 mg from the list and called [facility] and spoke with Juliann Pulse and cancelled the 25 mcg  Nothing further needed

## 2017-11-04 ENCOUNTER — Other Ambulatory Visit: Payer: Self-pay | Admitting: Internal Medicine

## 2017-11-07 DIAGNOSIS — H10412 Chronic giant papillary conjunctivitis, left eye: Secondary | ICD-10-CM | POA: Diagnosis not present

## 2017-11-07 DIAGNOSIS — H401132 Primary open-angle glaucoma, bilateral, moderate stage: Secondary | ICD-10-CM | POA: Diagnosis not present

## 2017-11-07 DIAGNOSIS — L719 Rosacea, unspecified: Secondary | ICD-10-CM | POA: Diagnosis not present

## 2017-11-07 DIAGNOSIS — H2513 Age-related nuclear cataract, bilateral: Secondary | ICD-10-CM | POA: Diagnosis not present

## 2017-11-10 ENCOUNTER — Other Ambulatory Visit: Payer: Self-pay | Admitting: Internal Medicine

## 2017-11-10 DIAGNOSIS — E871 Hypo-osmolality and hyponatremia: Secondary | ICD-10-CM

## 2017-11-16 ENCOUNTER — Other Ambulatory Visit (INDEPENDENT_AMBULATORY_CARE_PROVIDER_SITE_OTHER): Payer: Medicare Other

## 2017-11-16 DIAGNOSIS — E039 Hypothyroidism, unspecified: Secondary | ICD-10-CM | POA: Diagnosis not present

## 2017-11-16 LAB — TSH: TSH: 3.23 u[IU]/mL (ref 0.35–4.50)

## 2017-11-22 ENCOUNTER — Other Ambulatory Visit: Payer: Self-pay | Admitting: Internal Medicine

## 2017-11-22 DIAGNOSIS — E871 Hypo-osmolality and hyponatremia: Secondary | ICD-10-CM

## 2017-11-27 ENCOUNTER — Ambulatory Visit: Payer: Medicare Other | Admitting: Internal Medicine

## 2017-11-29 ENCOUNTER — Encounter: Payer: Self-pay | Admitting: Internal Medicine

## 2017-11-29 ENCOUNTER — Ambulatory Visit (INDEPENDENT_AMBULATORY_CARE_PROVIDER_SITE_OTHER): Payer: Medicare Other | Admitting: Internal Medicine

## 2017-11-29 VITALS — BP 122/76 | HR 84 | Ht 65.0 in | Wt 157.0 lb

## 2017-11-29 DIAGNOSIS — I10 Essential (primary) hypertension: Secondary | ICD-10-CM | POA: Diagnosis not present

## 2017-11-29 DIAGNOSIS — Z23 Encounter for immunization: Secondary | ICD-10-CM

## 2017-11-29 DIAGNOSIS — J9819 Other pulmonary collapse: Secondary | ICD-10-CM

## 2017-11-29 DIAGNOSIS — R0609 Other forms of dyspnea: Secondary | ICD-10-CM | POA: Diagnosis not present

## 2017-11-29 DIAGNOSIS — E039 Hypothyroidism, unspecified: Secondary | ICD-10-CM

## 2017-11-29 NOTE — Patient Instructions (Addendum)
Please see patient coordinator before you leave today  to schedule Internal medicine referral for hbp    Pulmonary follow up is as needed for flares of cough, short of breath or pain with breathing

## 2017-11-29 NOTE — Progress Notes (Signed)
Subjective:   Patient ID: [name], female    DOB: Sep 20, 1950    MRN: [id number]  Brief patient profile:  73 yowf quit smoking 2003 with h/o HBP and ? rml syndrome   History of Present Illness  June 26, 2008 cpx only c/o = hoarseness and indigestion off and on for up to sev years, worse for several months, better with tums and not eating as much late in evening. no cough or sob over baseline rec trial of diet only to reduce likelihood of gerd      07/14/2016 acute extended ov/Avry Monteleone re: cough  ? [name]  Chief Complaint  Patient presents with  . Acute Visit    Pt states last two weeks feels like s/t is in her throat when swollowing here and there not all the time. No issues breathing, and sleeping well at night. Pt is coughing some,nothing comes up. Pt states mouth dryer than usual in last two weeks.   cough and sensation of throat congestion worse in am's   Assoc with nasal d/c / some sneezing  But no excess/ purulent sputum or mucus plugs   rec Try prilosec otc 20mg   Take 30-60 min before first meal of the day and  ranitidine 150  mg one @  bedtime until cough is completely gone for at least a week without the need for cough suppression GERD  zpak  Prednisone 10 mg take  4 each am x 2 days,   2 each am x 2 days,  1 each am x 2 days and stop  Please schedule a follow up visit in 3 months with cpx -   but call sooner if needed for ent referral if not improved on above rx    10/21/2016  f/u ov/Charell Faulk re: hbp/ s/p gout flare on hctz completely resolved/ hyperlipidemia  Chief Complaint  Patient presents with  . Follow-up    She has occ cough with clear sputum. She is sneezing and watery eyes.    fall sneezing and watery eyes for decades worse x sev weeks better with otcs Good ex tol/ sleeping ok  rec Stop maxzide permanently     11/29/2017  f/u ov/Feven Alderfer re: hbp/ rml and lingular syndrome  - gout resolved Chief Complaint  Patient presents with  . Follow-up    Pt has some SOB with  exertion, wheezing on and off, and has productive cough-clear. Pt would like flu shot today.  Dyspnea:  MMRC1 = can walk nl pace, flat grade, can't hurry or go uphills or steps s sob   No regular work out Cough: sporadic/ little clear mucus Sleeping: bed flat/ props on 2 pillows  SABA use: no change with saba  02: none   08/19/17 er eval midline chest and back pain resolved on doxy  non-pleuritic   No obvious day to day or daytime variability or assoc excess/ purulent sputum or mucus plugs or hemoptysis   or chest tightness, subjective wheeze or overt sinus or hb symptoms.   Sleeping as above  without nocturnal  or early am exacerbation  of respiratory  c/o's or need for noct saba. Also denies any obvious fluctuation of symptoms with weather or environmental changes or other aggravating or alleviating factors except as outlined above   No unusual exposure hx or h/o childhood pna/ asthma or knowledge of premature birth.  Current Allergies, Complete Past Medical History, Past Surgical History, Family History, and Social History were reviewed in [employer] record.  ROS  The following are not active complaints unless bolded Hoarseness, sore throat, dysphagia, dental problems, itching, sneezing,  nasal congestion or discharge of excess mucus or purulent secretions, ear ache,   fever, chills, sweats, unintended wt loss or wt gain, classically pleuritic or exertional cp,  orthopnea pnd or arm/hand swelling  or leg swelling, presyncope, palpitations, abdominal pain, anorexia, nausea, vomiting, diarrhea  or change in bowel habits or change in bladder habits, change in stools or change in urine, dysuria, hematuria,  rash, arthralgias, visual complaints, headache, numbness, weakness or ataxia or problems with walking or coordination,  change in mood or  memory.        Current Meds  Medication Sig  . aspirin 81 MG tablet Take 81 mg by mouth daily.  . calcium carbonate (TUMS EX)  750 MG chewable tablet Chew 1 tablet by mouth as needed for heartburn.  . diphenhydrAMINE (BENADRYL) 25 MG tablet Take 25 mg by mouth at bedtime as needed for sleep.   . furosemide (LASIX) 20 MG tablet TAKE 1 TABLET BY MOUTH DAILY  . hydroxypropyl methylcellulose (ISOPTO TEARS) 2.5 % ophthalmic solution Place 1 drop into both eyes 3 (three) times daily as needed for dry eyes.  Marland Kitchen levothyroxine (SYNTHROID, LEVOTHROID) 100 MCG tablet TAKE 1 TABLET BY MOUTH EVERY DAY  . loperamide (IMODIUM A-D) 2 MG tablet Take 2 mg by mouth as needed for diarrhea or loose stools.  . Multiple Vitamin (MULTIVITAMIN) capsule Take 1 capsule by mouth every other day.  . Olopatadine HCl 0.2 % SOLN   . Omega-3 Fatty Acids (FISH OIL) 1000 MG CAPS Take by mouth every other day.  Marland Kitchen OVER THE COUNTER MEDICATION [facility] brand sugar free cough drops as needed  . Probiotic CAPS Take 1 capsule by mouth daily.  . ranitidine (ZANTAC) 150 MG tablet Take 150 mg by mouth daily as needed for heartburn.   . sodium chloride (OCEAN) 0.65 % nasal spray Place 1 spray into the nose 2 (two) times daily as needed for congestion.   . Travoprost, BAK Free, (TRAVATAN) 0.004 % SOLN ophthalmic solution Place 1 drop into both eyes at bedtime.  . Vitamin D, Cholecalciferol, 400 units CAPS Take by mouth once a week.         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/1996  DJD R Hip..............................................................................................Marland KitchenMarland KitchenAlusio  HEALTH MAINTENANCE........................................................................Marland Kitchen Referred to IM 11/29/2017  - GYN = [facility]  - Colonoscopy neg 04/23/2004  - Td 06/2008  - Pneumovax 07/2004  Severe  localized reaction/ prevnar 13 02/12/2014  - CPX  10/21/2016     Family History:  allergies in mother  lymphoma in mother  asthma in brother 65 years older only sibling  colon ca brother  Prostate ca same brother neg cva/aneurysms  IHD Father onset 56s  IPF in brother 69 y older   Social History:  Quit smokng Jan 2003   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 Nl external ear canals without cough reflex   NECK :  without JVD/Nodes/TM/ nl carotid upstrokes bilaterally   LUNGS: no acc muscle use,  Nl contour chest which is clear to A and P bilaterally without cough on insp or exp maneuvers   CV:  RRR  no s3 or murmur or increase in P2, and no edema   ABD:  soft and nontender with nl inspiratory excursion in the supine position. No bruits or organomegaly appreciated, bowel sounds nl  MS:  Nl gait/ ext warm without deformities, calf tenderness, cyanosis or clubbing No obvious joint restrictions   SKIN: warm and dry without lesions    NEURO:  alert, approp, nl sensorium with  no motor or cerebellar deficits apparent.      I personally reviewed images and agree with radiology impression as follows:   Chest CTa 08/19/17  1. No acute cardiopulmonary disease. Specifically, no evidence of thoracic aortic aneurysm or dissection on this nongated examination. No evidence of central pulmonary embolism. 2. 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                 .   Assessment & Plan:

## 2017-11-30 ENCOUNTER — Encounter: Payer: Self-pay | Admitting: Internal Medicine

## 2017-11-30 DIAGNOSIS — J9819 Other pulmonary collapse: Secondary | ICD-10-CM | POA: Insufficient documentation

## 2017-11-30 NOTE — Assessment & Plan Note (Signed)
Spirometry 11/29/2017  FEV1 1.4 (56%)  Ratio 73 min curvature   She has chronic mild doe more likely related to conditioning that airways dz though could have very mild copd based on previous smoking hx  If worsens need to re-eval, otherwise f/u by new PCP

## 2017-11-30 NOTE — Assessment & Plan Note (Addendum)
Lab Results  Component Value Date   TSH 3.23 11/16/2017     Adequate control on present rx, reviewed in detail with pt > no change in rx needed  = synthroid 100 mcg daily    I had an extended discussion with the patient reviewing all relevant studies completed to date and  lasting 15 to 20 minutes of a 25 minute visit    Each maintenance medication was reviewed in detail including most importantly the difference between maintenance and prns and under what circumstances the prns are to be triggered using an action plan format that is not reflected in the computer generated alphabetically organized AVS.     Please see AVS for specific instructions unique to this visit that I personally wrote and verbalized to the the pt in detail and then reviewed with pt  by my nurse highlighting any  changes in therapy recommended at today's visit to their plan of care.

## 2017-11-30 NOTE — Assessment & Plan Note (Addendum)
S/p FOB 09/18/96 with patent airways  - see CTa 08/20/16 RML/lingular changes as seen also on plain film    This is an extremely common benign condition in late middle age to elderly females and does not warrant aggressive eval/ rx at this point unless there is a clinical correlation suggesting unaddressed pulmonary infection (purulent sputum, night sweats, unintended wt loss, doe) or evolution of  obvious changes on plain cxr (as opposed to serial CT, which is way over sensitive to make clinical decisions re intervention and treatment in the this pt population, who tend to tolerate both dx and treatment poorly)     >>>> pulmonary f/u is prn

## 2017-11-30 NOTE — Assessment & Plan Note (Addendum)
Adequate control on present rx= lasix 20 mg /  reviewed in detail with pt > no change in rx needed as had gout on HCTZ   >  Referred to IM in [place] area at her request for long term f/u

## 2017-12-01 ENCOUNTER — Telehealth: Payer: Self-pay | Admitting: Emergency Medicine

## 2017-12-01 NOTE — Telephone Encounter (Signed)
Please advise. Letting patient know Birdie Riddle is out of the office and will notify her with update  Copied from [place] [id number]. Topic: Appointment Scheduling - Scheduling Inquiry for Clinic >> Nov 29, 2017  3:04 PM Sheran Luz wrote: Reason for CRM: Shantel from [facility] calling to inquire if Dr. Birdie Riddle would accept [place] as a new patient. May contact patient rather than Shantel if Dr. Birdie Riddle accepts.   Pt KG#[phone] >> Nov 29, 2017  3:14 PM Katina Dung, [place] wrote: Routing to provider to advise >> Dec 01, 2017  9:32 AM Celedonio Savage L wrote: Pt calling back to se if Dr Birdie Riddle would accept her as a new pt please call pt at [phone]

## 2017-12-01 NOTE — Telephone Encounter (Signed)
Spoke with patient about NPT appointment with Dr Birdie Riddle. Patient is not in urgency for scheduling but doesn't mind seeing any provider in the office. Please call patient back to schedule.

## 2017-12-05 NOTE — Telephone Encounter (Signed)
I have LM making pt aware that she can schedule an appt with Dr. Birdie Riddle.

## 2017-12-05 NOTE — Telephone Encounter (Signed)
Ok to schedule since referred by pulmonary

## 2017-12-05 NOTE — Telephone Encounter (Signed)
Fyi. Can you please schedule in first available NP

## 2017-12-07 ENCOUNTER — Other Ambulatory Visit: Payer: Self-pay | Admitting: Internal Medicine

## 2017-12-07 DIAGNOSIS — E871 Hypo-osmolality and hyponatremia: Secondary | ICD-10-CM

## 2017-12-25 ENCOUNTER — Other Ambulatory Visit: Payer: Self-pay | Admitting: Internal Medicine

## 2017-12-25 DIAGNOSIS — Z1231 Encounter for screening mammogram for malignant neoplasm of breast: Secondary | ICD-10-CM

## 2017-12-28 ENCOUNTER — Other Ambulatory Visit: Payer: Self-pay | Admitting: Internal Medicine

## 2017-12-28 ENCOUNTER — Ambulatory Visit
Admission: RE | Admit: 2017-12-28 | Discharge: 2017-12-28 | Disposition: A | Discharge status: Home / Self Care | Payer: Medicare Other | Source: Ambulatory Visit

## 2017-12-28 DIAGNOSIS — Z1231 Encounter for screening mammogram for malignant neoplasm of breast: Secondary | ICD-10-CM

## 2017-12-29 ENCOUNTER — Encounter: Payer: Self-pay | Admitting: Internal Medicine

## 2017-12-29 DIAGNOSIS — Z0000 Encounter for general adult medical examination without abnormal findings: Secondary | ICD-10-CM | POA: Insufficient documentation

## 2018-01-24 ENCOUNTER — Ambulatory Visit (INDEPENDENT_AMBULATORY_CARE_PROVIDER_SITE_OTHER): Payer: Medicare Other | Admitting: Family Medicine

## 2018-01-24 ENCOUNTER — Other Ambulatory Visit: Payer: Self-pay

## 2018-01-24 ENCOUNTER — Encounter: Payer: Self-pay | Admitting: Family Medicine

## 2018-01-24 VITALS — BP 126/82 | HR 74 | Temp 98.4°F | Resp 16 | Ht 65.0 in | Wt 161.0 lb

## 2018-01-24 DIAGNOSIS — E785 Hyperlipidemia, unspecified: Secondary | ICD-10-CM | POA: Diagnosis not present

## 2018-01-24 DIAGNOSIS — I10 Essential (primary) hypertension: Secondary | ICD-10-CM | POA: Diagnosis not present

## 2018-01-24 DIAGNOSIS — M8580 Other specified disorders of bone density and structure, unspecified site: Secondary | ICD-10-CM

## 2018-01-24 DIAGNOSIS — E039 Hypothyroidism, unspecified: Secondary | ICD-10-CM | POA: Diagnosis not present

## 2018-01-24 LAB — CBC WITH DIFFERENTIAL/PLATELET
Basophils Absolute: 0 10*3/uL (ref 0.0–0.1)
Basophils Relative: 0.6 % (ref 0.0–3.0)
Eosinophils Absolute: 0.2 10*3/uL (ref 0.0–0.7)
Eosinophils Relative: 3 % (ref 0.0–5.0)
HCT: 43.7 % (ref 36.0–46.0)
Hemoglobin: 14.9 g/dL (ref 12.0–15.0)
LYMPHS ABS: 1.4 10*3/uL (ref 0.7–4.0)
Lymphocytes Relative: 25 % (ref 12.0–46.0)
MCHC: 34 g/dL (ref 30.0–36.0)
MCV: 88.1 fl (ref 78.0–100.0)
MONOS PCT: 10.2 % (ref 3.0–12.0)
Monocytes Absolute: 0.6 10*3/uL (ref 0.1–1.0)
Neutro Abs: 3.4 10*3/uL (ref 1.4–7.7)
Neutrophils Relative %: 61.2 % (ref 43.0–77.0)
Platelets: 229 10*3/uL (ref 150.0–400.0)
RBC: 4.97 Mil/uL (ref 3.87–5.11)
RDW: 12.9 % (ref 11.5–15.5)
WBC: 5.6 10*3/uL (ref 4.0–10.5)

## 2018-01-24 LAB — LIPID PANEL
Cholesterol: 251 mg/dL — ABNORMAL HIGH (ref 0–200)
HDL: 67.4 mg/dL (ref >39.00)
NonHDL: 183.2
Total CHOL/HDL Ratio: 4
Triglycerides: 271 mg/dL — ABNORMAL HIGH (ref 0.0–149.0)
VLDL: 54.2 mg/dL — ABNORMAL HIGH (ref 0.0–40.0)

## 2018-01-24 LAB — BASIC METABOLIC PANEL
BUN: 17 mg/dL (ref 6–23)
CO2: 28 mEq/L (ref 19–32)
Calcium: 10.4 mg/dL (ref 8.4–10.5)
Chloride: 98 mEq/L (ref 96–112)
Creatinine, Ser: 0.82 mg/dL (ref 0.40–1.20)
GFR: 73.31 mL/min (ref >60.00)
Glucose, Bld: 107 mg/dL — ABNORMAL HIGH (ref 70–99)
Potassium: 4 mEq/L (ref 3.5–5.1)
Sodium: 136 mEq/L (ref 135–145)

## 2018-01-24 LAB — TSH: TSH: 2.95 u[IU]/mL (ref 0.35–4.50)

## 2018-01-24 LAB — HEPATIC FUNCTION PANEL
ALT: 25 U/L (ref 0–35)
AST: 24 U/L (ref 0–37)
Albumin: 4.8 g/dL (ref 3.5–5.2)
Alkaline Phosphatase: 63 U/L (ref 39–117)
BILIRUBIN TOTAL: 0.6 mg/dL (ref 0.2–1.2)
Bilirubin, Direct: 0.1 mg/dL (ref 0.0–0.3)
Total Protein: 8.1 g/dL (ref 6.0–8.3)

## 2018-01-24 LAB — LDL CHOLESTEROL, DIRECT: Direct LDL: 135 mg/dL

## 2018-01-24 NOTE — Assessment & Plan Note (Signed)
Chronic problem.  She is not currently on medication.  She has not had lipids in >1 yr.  Check labs and start meds prn.

## 2018-01-24 NOTE — Patient Instructions (Signed)
Follow up in 6 months to recheck BP and cholesterol We'll notify you of your lab results and make any changes if needed (we are not doing Vit D b/c your insurance did not approve it) Continue to work on healthy diet and regular exercise- you can do it! Call with any questions or concerns Happy New Year! Welcome!  We're glad to have you!!!

## 2018-01-24 NOTE — Assessment & Plan Note (Signed)
Chronic problem.  Adequate control.  Asymptomatic at this time.  Check labs.  No anticipated med changes.  Will follow. 

## 2018-01-24 NOTE — Progress Notes (Signed)
   Subjective:    Patient ID: [name], female    DOB: 04/05/1950, 68 y.o.   MRN: [id number]  HPI New to establish.  Previous PCP- [facility]- none recently, UTD on mammo.  HTN- chronic problem.  Was switched from Maxzide to Lasix 20mg  daily w/ adequate control.  No CP, SOB, HAs, edema  Hyperlipidemia- pt has not had labs since 10/18.  LDL at that time was 156.  Not currently on medication.    Hypothyroid- chronic problem for pt.  TSH was again high this summer and Levothyroxine dose is currently 163mcg daily.  Energy level has improved, constipation has improved, skin/hair/nails have improved.  Osteopenia- pt is not interested in a DEXA b/c 'i'm not going to take the medicine'.  Will take Vit D 'occasionally' b/c 'Dr Melvyn Novas was not a fan of that'.     Review of Systems For ROS see HPI     Objective:   Physical Exam Vitals signs reviewed.  Constitutional:      General: She is not in acute distress.    Appearance: She is well-developed.  HENT:     Head: Normocephalic and atraumatic.  Eyes:     Conjunctiva/sclera: Conjunctivae normal.     Pupils: Pupils are equal, round, and reactive to light.  Neck:     Musculoskeletal: Normal range of motion and neck supple.     Thyroid: No thyromegaly.  Cardiovascular:     Rate and Rhythm: Normal rate and regular rhythm.     Heart sounds: Normal heart sounds. No murmur.  Pulmonary:     Effort: Pulmonary effort is normal. No respiratory distress.     Breath sounds: Normal breath sounds.  Abdominal:     General: There is no distension.     Palpations: Abdomen is soft.     Tenderness: There is no abdominal tenderness.  Lymphadenopathy:     Cervical: No cervical adenopathy.  Skin:    General: Skin is warm and dry.  Neurological:     Mental Status: She is alert and oriented to person, place, and time.  Psychiatric:        Behavior: Behavior normal.           Assessment & Plan:

## 2018-01-24 NOTE — Assessment & Plan Note (Signed)
Pt refused DEXA b/c she refuses osteoporosis medication.  Insurance will not cover Vit D level.  Encouraged daily Ca and Vit D supplements.

## 2018-01-24 NOTE — Assessment & Plan Note (Signed)
chronic problem.  Currently asymptomatic.  Check labs.  Adjust meds prn

## 2018-01-25 ENCOUNTER — Encounter: Payer: Self-pay | Admitting: General Practice

## 2018-02-23 ENCOUNTER — Ambulatory Visit: Payer: Medicare Other | Admitting: Internal Medicine

## 2018-03-21 DIAGNOSIS — H524 Presbyopia: Secondary | ICD-10-CM | POA: Diagnosis not present

## 2018-03-21 DIAGNOSIS — H04123 Dry eye syndrome of bilateral lacrimal glands: Secondary | ICD-10-CM | POA: Diagnosis not present

## 2018-03-21 DIAGNOSIS — H401132 Primary open-angle glaucoma, bilateral, moderate stage: Secondary | ICD-10-CM | POA: Diagnosis not present

## 2018-03-21 DIAGNOSIS — H5213 Myopia, bilateral: Secondary | ICD-10-CM | POA: Diagnosis not present

## 2018-03-21 DIAGNOSIS — H52223 Regular astigmatism, bilateral: Secondary | ICD-10-CM | POA: Diagnosis not present

## 2018-03-21 DIAGNOSIS — H2513 Age-related nuclear cataract, bilateral: Secondary | ICD-10-CM | POA: Diagnosis not present

## 2018-04-06 ENCOUNTER — Other Ambulatory Visit: Payer: Self-pay

## 2018-04-06 ENCOUNTER — Other Ambulatory Visit: Payer: Self-pay | Admitting: Family Medicine

## 2018-04-06 ENCOUNTER — Encounter: Payer: Self-pay | Admitting: Family Medicine

## 2018-04-06 ENCOUNTER — Ambulatory Visit (INDEPENDENT_AMBULATORY_CARE_PROVIDER_SITE_OTHER): Payer: Medicare Other | Admitting: Family Medicine

## 2018-04-06 VITALS — BP 128/74 | HR 81 | Temp 98.5°F | Resp 16 | Ht 65.0 in | Wt 158.2 lb

## 2018-04-06 DIAGNOSIS — M720 Palmar fascial fibromatosis [Dupuytren]: Secondary | ICD-10-CM

## 2018-04-06 DIAGNOSIS — E871 Hypo-osmolality and hyponatremia: Secondary | ICD-10-CM

## 2018-04-06 DIAGNOSIS — B070 Plantar wart: Secondary | ICD-10-CM

## 2018-04-06 NOTE — Progress Notes (Signed)
   Subjective:    Patient ID: [name], female    DOB: [birth year], 68 y.o.   MRN: [id number]  HPI Toe issue- pt reports dark areas on bottom of L great toe.  At first there were only 2.  Now 3.  Some tenderness.  First noticed a 'few weeks ago'.  No similar areas on other foot.  No change in footwear.  Not enlarging in size.  R hand nodule- present 'for years'.  Intermittently uncomfortable.  No grip weakness.  Review of Systems For ROS see HPI     Objective:   Physical Exam Vitals signs reviewed.  Constitutional:      General: She is not in acute distress.    Appearance: Normal appearance.  Musculoskeletal:        General: Deformity (nodular contracture of R palm) present.  Skin:    General: Skin is warm and dry.     Comments: 3 establish seed warts on plantar surface of L great toe w/ a 4th emerging  Neurological:     Mental Status: She is alert.           Assessment & Plan:  Warts- reviewed dx w/ pt.  Start OTC Compound W.  Reviewed supportive care and red flags that should prompt return.  Pt expressed understanding and is in agreement w/ plan.   R hand nodule- appears to be early contracture.  Discussed referral to hand specialist but encouraged her to wait until there was not a global pandemic.  Pt expressed understanding and is in agreement w/ plan.

## 2018-04-06 NOTE — Patient Instructions (Signed)
Follow up as needed or as scheduled APPLY Compound W to the areas on the toe as directed on the bottle and then cover w/ duct tape.  Remove and reapply as directed Once things calm down, we can refer to a hand specialist Call with any questions or concerns Stay Safe!

## 2018-08-01 DIAGNOSIS — L720 Epidermal cyst: Secondary | ICD-10-CM | POA: Diagnosis not present

## 2018-08-01 DIAGNOSIS — L821 Other seborrheic keratosis: Secondary | ICD-10-CM | POA: Diagnosis not present

## 2018-08-01 DIAGNOSIS — D1801 Hemangioma of skin and subcutaneous tissue: Secondary | ICD-10-CM | POA: Diagnosis not present

## 2018-08-01 DIAGNOSIS — Z85828 Personal history of other malignant neoplasm of skin: Secondary | ICD-10-CM | POA: Diagnosis not present

## 2018-08-01 DIAGNOSIS — L820 Inflamed seborrheic keratosis: Secondary | ICD-10-CM | POA: Diagnosis not present

## 2018-08-01 DIAGNOSIS — L905 Scar conditions and fibrosis of skin: Secondary | ICD-10-CM | POA: Diagnosis not present

## 2018-09-28 DIAGNOSIS — H10413 Chronic giant papillary conjunctivitis, bilateral: Secondary | ICD-10-CM | POA: Diagnosis not present

## 2018-09-28 DIAGNOSIS — H401132 Primary open-angle glaucoma, bilateral, moderate stage: Secondary | ICD-10-CM | POA: Diagnosis not present

## 2018-09-28 DIAGNOSIS — H2513 Age-related nuclear cataract, bilateral: Secondary | ICD-10-CM | POA: Diagnosis not present

## 2018-09-28 DIAGNOSIS — H35371 Puckering of macula, right eye: Secondary | ICD-10-CM | POA: Diagnosis not present

## 2018-09-28 DIAGNOSIS — L719 Rosacea, unspecified: Secondary | ICD-10-CM | POA: Diagnosis not present

## 2018-11-20 ENCOUNTER — Other Ambulatory Visit: Payer: Self-pay | Admitting: General Practice

## 2018-11-20 DIAGNOSIS — H401132 Primary open-angle glaucoma, bilateral, moderate stage: Secondary | ICD-10-CM | POA: Diagnosis not present

## 2018-11-20 DIAGNOSIS — H10413 Chronic giant papillary conjunctivitis, bilateral: Secondary | ICD-10-CM | POA: Diagnosis not present

## 2018-11-20 DIAGNOSIS — H2513 Age-related nuclear cataract, bilateral: Secondary | ICD-10-CM | POA: Diagnosis not present

## 2018-11-20 DIAGNOSIS — E871 Hypo-osmolality and hyponatremia: Secondary | ICD-10-CM

## 2018-11-20 MED ORDER — LEVOTHYROXINE SODIUM 100 MCG PO TABS: 100.0000 ug | ORAL_TABLET | Freq: Every day | ORAL | 1 refills | Status: DC

## 2018-11-20 MED ORDER — FUROSEMIDE 20 MG PO TABS: 20.0000 mg | ORAL_TABLET | Freq: Every day | ORAL | 1 refills | Status: DC

## 2018-12-21 IMAGING — MG DIGITAL SCREENING BILATERAL MAMMOGRAM WITH TOMO AND CAD
6 of 12 series · 6 of 36 positions shown · non-contrast
Comparison: Previous exam(s).

CLINICAL DATA: Screening.

EXAM:
DIGITAL SCREENING BILATERAL MAMMOGRAM WITH TOMO AND CAD

[L MLO synth-2D (1 of 2)]
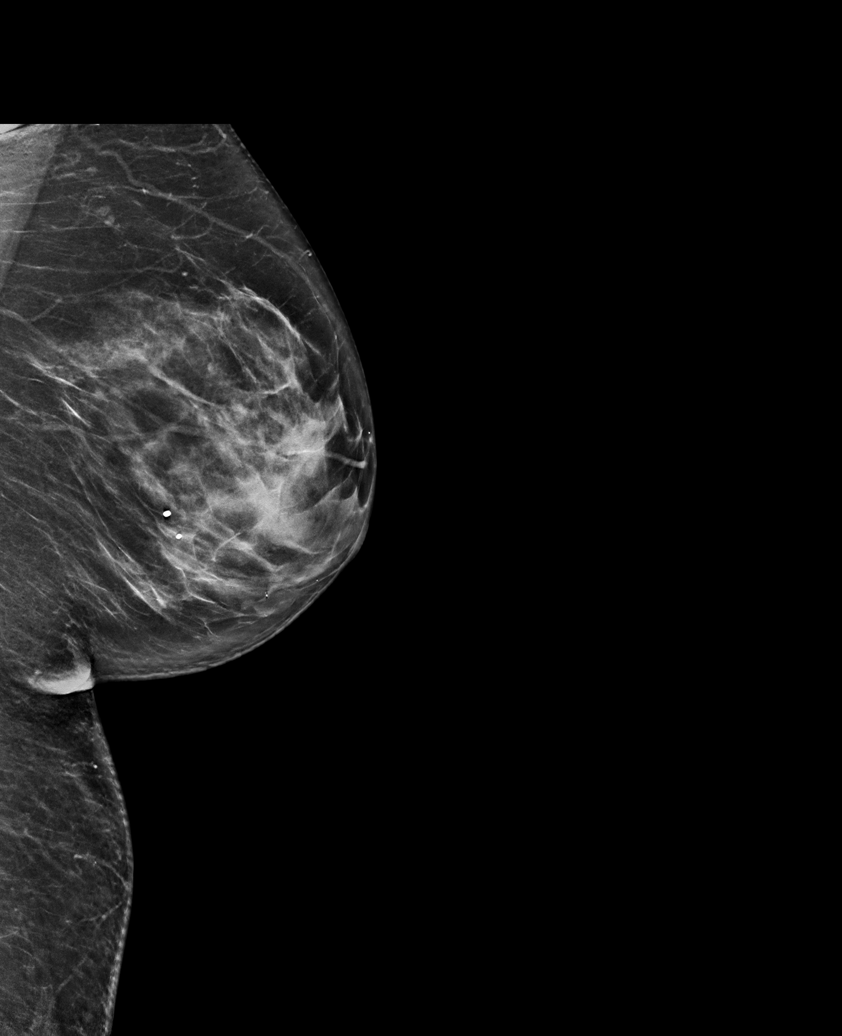

[L CC synth-2D]
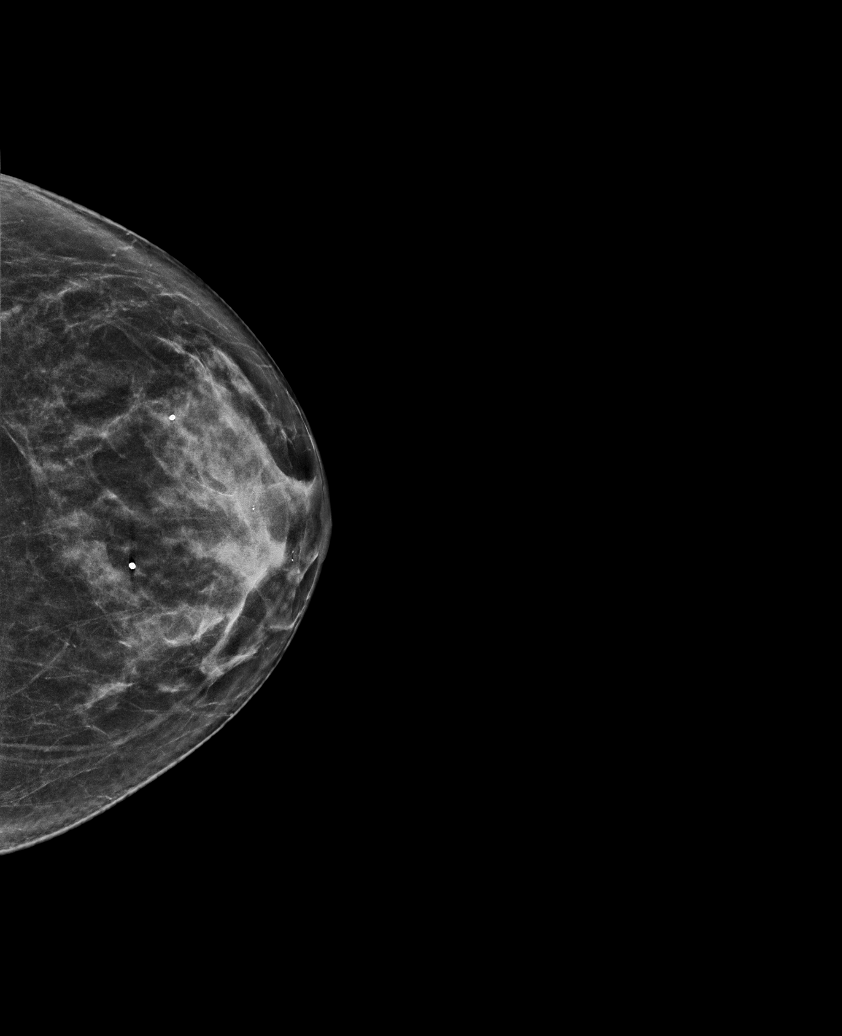

[R CC synth-2D]
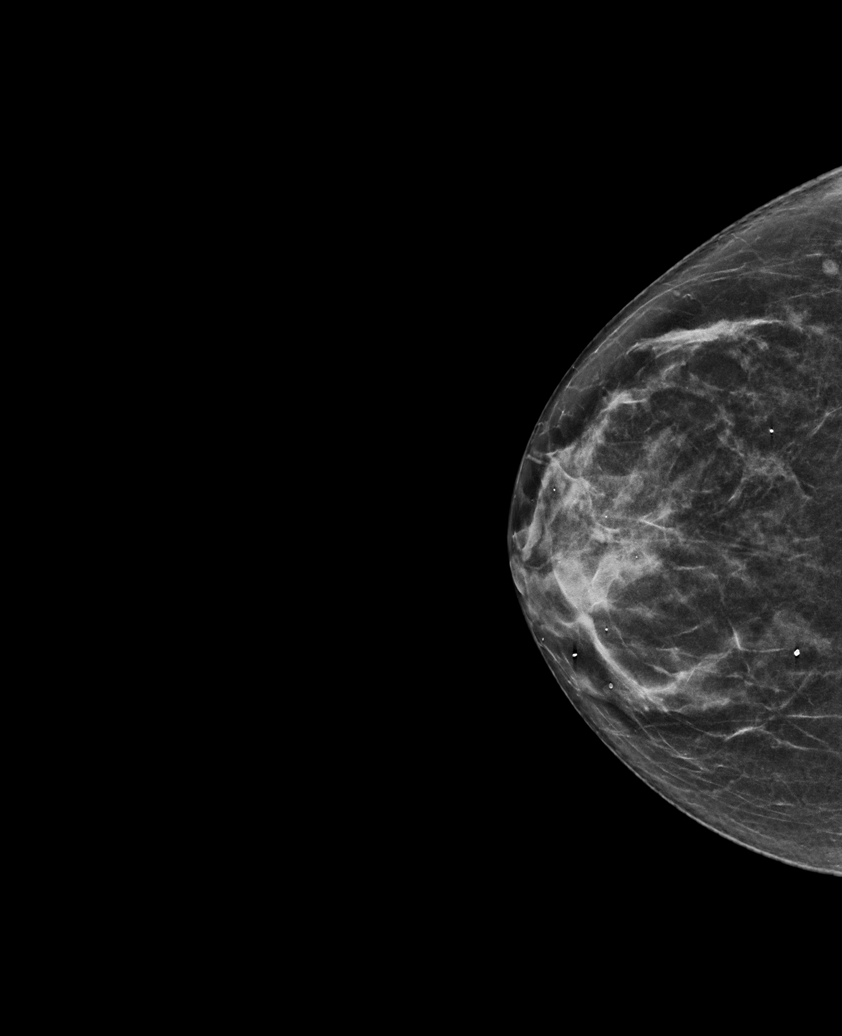

[L MLO synth-2D (2 of 2)]
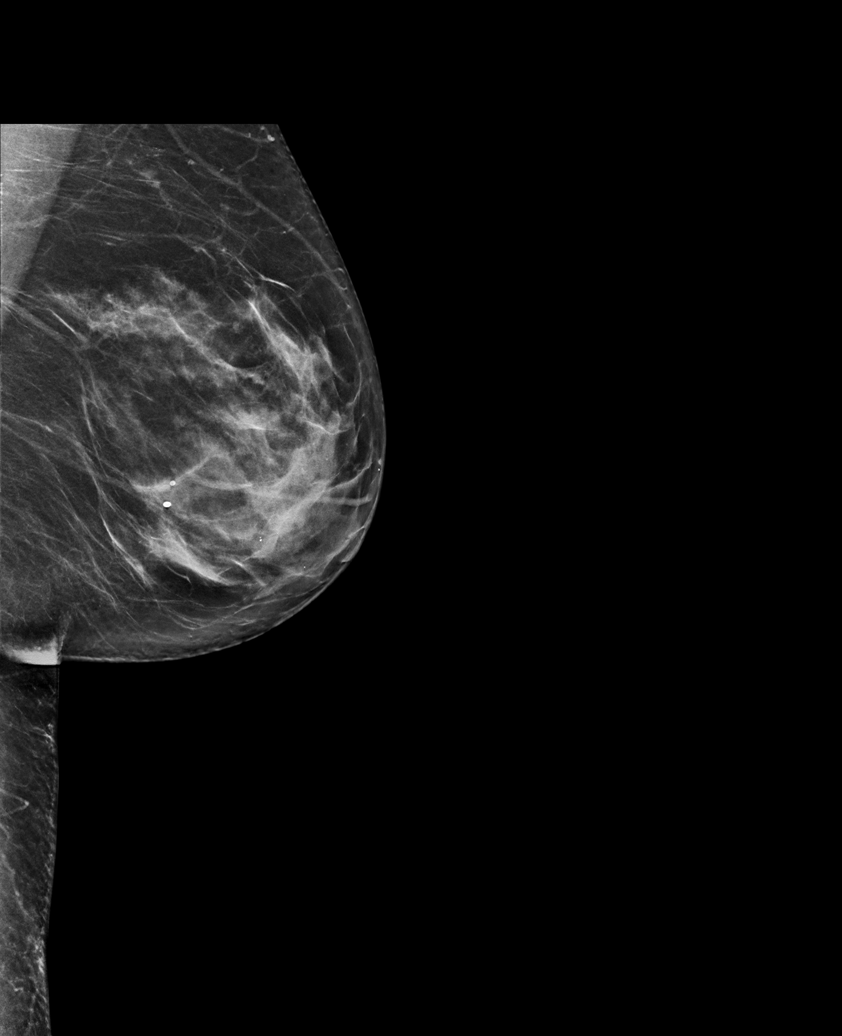

[R MLO synth-2D (1 of 2)]
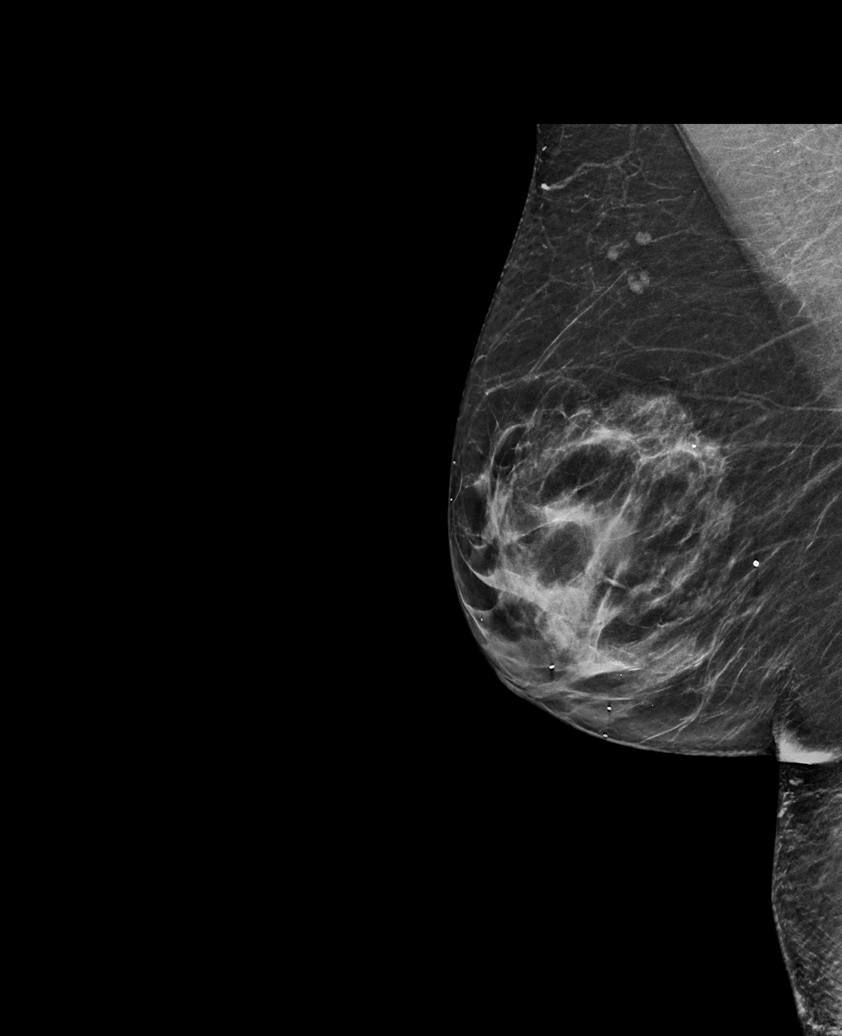

[R MLO synth-2D (2 of 2)]
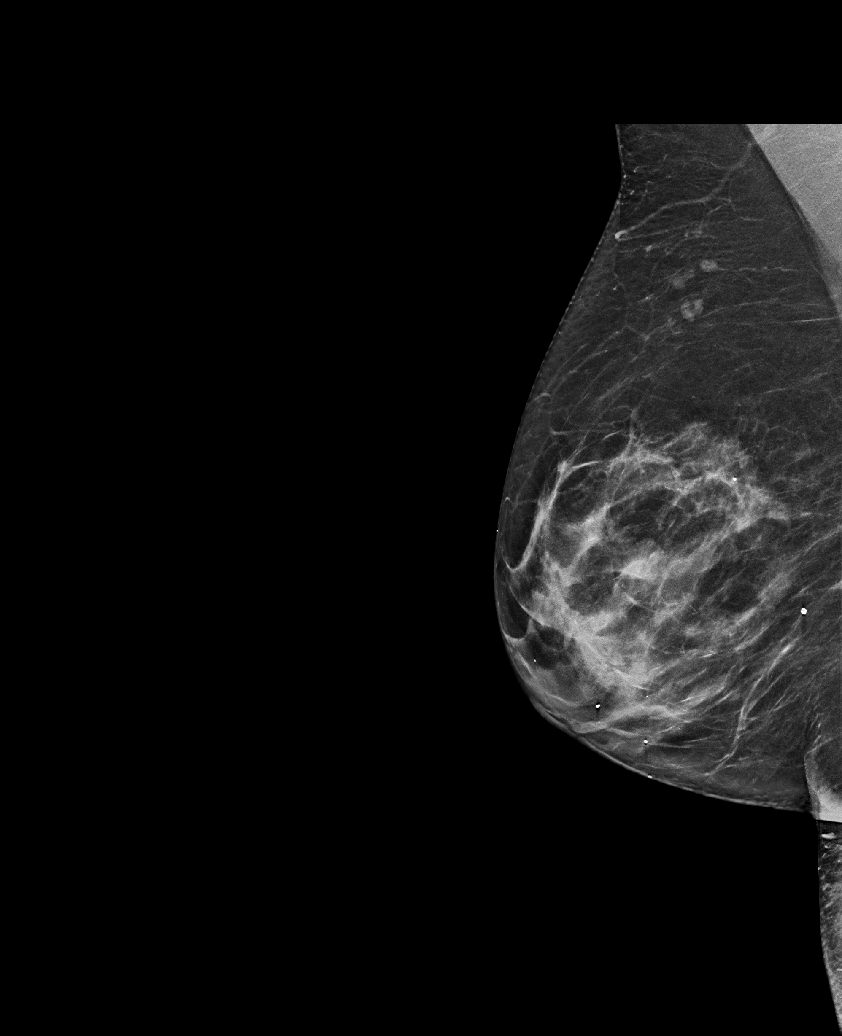

[6 of 36 positions shown; findings below may reference images not displayed]

ACR Breast Density Category c: The breast tissue is heterogeneously
dense, which may obscure small masses.
FINDINGS: There are no findings suspicious for malignancy. Images were
processed with CAD.
IMPRESSION: No mammographic evidence of malignancy. A result letter of this
screening mammogram will be mailed directly to the patient.

RECOMMENDATION:
Screening mammogram in one year. (Code:[id number])

BI-RADS CATEGORY  1: Negative.

## 2019-02-26 ENCOUNTER — Encounter: Payer: Self-pay | Admitting: Family Medicine

## 2019-03-14 ENCOUNTER — Ambulatory Visit: Payer: Medicare Other | Attending: Internal Medicine

## 2019-03-14 DIAGNOSIS — Z23 Encounter for immunization: Secondary | ICD-10-CM | POA: Insufficient documentation

## 2019-03-14 NOTE — Progress Notes (Signed)
   Covid-19 Vaccination Clinic  Name:  [name]    MRN: [id number] DOB: 1950/03/11  03/14/2019  [name] was observed post Covid-19 immunization for 15 minutes without incidence. She was provided with Vaccine Information Sheet and instruction to access the V-Safe system.   [name] was instructed to call 911 with any severe reactions post vaccine: Marland Kitchen Difficulty breathing  . Swelling of your face and throat  . A fast heartbeat  . A bad rash all over your body  . Dizziness and weakness    Immunizations Administered    Name Date Dose VIS Date Route   Pfizer COVID-19 Vaccine 03/14/2019 12:19 PM 0.3 mL 12/28/2018 Intramuscular   Manufacturer: [place]   Lot: [id number]   [place]: [id number]

## 2019-04-09 ENCOUNTER — Ambulatory Visit: Payer: Medicare Other | Attending: Internal Medicine

## 2019-04-09 DIAGNOSIS — Z23 Encounter for immunization: Secondary | ICD-10-CM

## 2019-04-09 NOTE — Progress Notes (Signed)
   Covid-19 Vaccination Clinic  Name:  Joelee Stallsworth    MRN: [id number] DOB: 05/18/1950  04/09/2019  Ms. Dahmer was observed post Covid-19 immunization for 15 minutes without incident. She was provided with Vaccine Information Sheet and instruction to access the V-Safe system.   Ms. Obray was instructed to call 911 with any severe reactions post vaccine: Marland Kitchen Difficulty breathing  . Swelling of face and throat  . A fast heartbeat  . A bad rash all over body  . Dizziness and weakness   Immunizations Administered    Name Date Dose VIS Date Route   Pfizer COVID-19 Vaccine 04/09/2019 11:48 AM 0.3 mL 12/28/2018 Intramuscular   Manufacturer: [place]   Lot: [id number]   [place]: [id number]

## 2019-04-12 DIAGNOSIS — H10413 Chronic giant papillary conjunctivitis, bilateral: Secondary | ICD-10-CM | POA: Diagnosis not present

## 2019-04-12 DIAGNOSIS — H401132 Primary open-angle glaucoma, bilateral, moderate stage: Secondary | ICD-10-CM | POA: Diagnosis not present

## 2019-04-12 DIAGNOSIS — H2513 Age-related nuclear cataract, bilateral: Secondary | ICD-10-CM | POA: Diagnosis not present

## 2019-04-12 DIAGNOSIS — H35371 Puckering of macula, right eye: Secondary | ICD-10-CM | POA: Diagnosis not present

## 2019-05-16 ENCOUNTER — Other Ambulatory Visit: Payer: Self-pay | Admitting: Emergency Medicine

## 2019-05-16 ENCOUNTER — Encounter: Payer: Self-pay | Admitting: Emergency Medicine

## 2019-05-16 DIAGNOSIS — E871 Hypo-osmolality and hyponatremia: Secondary | ICD-10-CM

## 2019-05-16 DIAGNOSIS — E039 Hypothyroidism, unspecified: Secondary | ICD-10-CM

## 2019-05-16 MED ORDER — FUROSEMIDE 20 MG PO TABS: 20.0000 mg | ORAL_TABLET | Freq: Every day | ORAL | 0 refills | Status: DC

## 2019-05-16 MED ORDER — LEVOTHYROXINE SODIUM 100 MCG PO TABS: 100.0000 ug | ORAL_TABLET | Freq: Every day | ORAL | 0 refills | Status: DC

## 2019-05-30 ENCOUNTER — Telehealth: Payer: Self-pay | Admitting: Family Medicine

## 2019-05-30 NOTE — Telephone Encounter (Signed)
Cancelled appt for 06/12/19 for AWV, NHA no longer at practice.  We call her back once NHA is replaced at office, or is she like her pcp can do it for her.

## 2019-06-12 ENCOUNTER — Ambulatory Visit: Payer: Medicare Other | Admitting: Family Medicine

## 2019-06-12 ENCOUNTER — Ambulatory Visit: Payer: Medicare Other

## 2019-06-20 ENCOUNTER — Other Ambulatory Visit: Payer: Self-pay

## 2019-06-20 ENCOUNTER — Ambulatory Visit (INDEPENDENT_AMBULATORY_CARE_PROVIDER_SITE_OTHER): Payer: Medicare Other | Admitting: Family Medicine

## 2019-06-20 ENCOUNTER — Encounter: Payer: Self-pay | Admitting: Family Medicine

## 2019-06-20 ENCOUNTER — Ambulatory Visit (INDEPENDENT_AMBULATORY_CARE_PROVIDER_SITE_OTHER)
Admission: RE | Admit: 2019-06-20 | Discharge: 2019-06-20 | Disposition: A | Discharge status: Home / Self Care | Payer: Medicare Other | Source: Ambulatory Visit | Attending: Family Medicine | Admitting: Family Medicine

## 2019-06-20 VITALS — BP 136/83 | HR 86 | Temp 98.1°F | Resp 16 | Ht 65.0 in | Wt 154.4 lb

## 2019-06-20 DIAGNOSIS — M7989 Other specified soft tissue disorders: Secondary | ICD-10-CM

## 2019-06-20 DIAGNOSIS — E785 Hyperlipidemia, unspecified: Secondary | ICD-10-CM

## 2019-06-20 DIAGNOSIS — S60021A Contusion of right index finger without damage to nail, initial encounter: Secondary | ICD-10-CM | POA: Diagnosis not present

## 2019-06-20 DIAGNOSIS — E039 Hypothyroidism, unspecified: Secondary | ICD-10-CM

## 2019-06-20 DIAGNOSIS — I10 Essential (primary) hypertension: Secondary | ICD-10-CM | POA: Diagnosis not present

## 2019-06-20 LAB — CBC WITH DIFFERENTIAL/PLATELET
Basophils Absolute: 0 10*3/uL (ref 0.0–0.1)
Basophils Relative: 0.5 % (ref 0.0–3.0)
Eosinophils Absolute: 0.2 10*3/uL (ref 0.0–0.7)
Eosinophils Relative: 2.1 % (ref 0.0–5.0)
HCT: 36.1 % (ref 36.0–46.0)
Hemoglobin: 12.5 g/dL (ref 12.0–15.0)
Lymphocytes Relative: 21.5 % (ref 12.0–46.0)
Lymphs Abs: 1.6 10*3/uL (ref 0.7–4.0)
MCHC: 34.8 g/dL (ref 30.0–36.0)
MCV: 87.9 fl (ref 78.0–100.0)
Monocytes Absolute: 0.7 10*3/uL (ref 0.1–1.0)
Monocytes Relative: 8.9 % (ref 3.0–12.0)
Neutro Abs: 5 10*3/uL (ref 1.4–7.7)
Neutrophils Relative %: 67 % (ref 43.0–77.0)
Platelets: 294 10*3/uL (ref 150.0–400.0)
RBC: 4.11 Mil/uL (ref 3.87–5.11)
RDW: 12.7 % (ref 11.5–15.5)
WBC: 7.5 10*3/uL (ref 4.0–10.5)

## 2019-06-20 LAB — BASIC METABOLIC PANEL
BUN: 16 mg/dL (ref 6–23)
CO2: 27 mEq/L (ref 19–32)
Calcium: 9.4 mg/dL (ref 8.4–10.5)
Chloride: 95 mEq/L — ABNORMAL LOW (ref 96–112)
Creatinine, Ser: 0.71 mg/dL (ref 0.40–1.20)
GFR: 81.12 mL/min (ref >60.00)
Glucose, Bld: 98 mg/dL (ref 70–99)
Potassium: 4.2 mEq/L (ref 3.5–5.1)
Sodium: 131 mEq/L — ABNORMAL LOW (ref 135–145)

## 2019-06-20 LAB — LIPID PANEL
Cholesterol: 215 mg/dL — ABNORMAL HIGH (ref 0–200)
HDL: 47.5 mg/dL (ref >39.00)
NonHDL: 167.09
Total CHOL/HDL Ratio: 5
Triglycerides: 267 mg/dL — ABNORMAL HIGH (ref 0.0–149.0)
VLDL: 53.4 mg/dL — ABNORMAL HIGH (ref 0.0–40.0)

## 2019-06-20 LAB — HEPATIC FUNCTION PANEL
ALT: 17 U/L (ref 0–35)
AST: 20 U/L (ref 0–37)
Albumin: 4.6 g/dL (ref 3.5–5.2)
Alkaline Phosphatase: 60 U/L (ref 39–117)
Bilirubin, Direct: 0.1 mg/dL (ref 0.0–0.3)
Total Bilirubin: 0.4 mg/dL (ref 0.2–1.2)
Total Protein: 7.4 g/dL (ref 6.0–8.3)

## 2019-06-20 LAB — LDL CHOLESTEROL, DIRECT: Direct LDL: 122 mg/dL

## 2019-06-20 LAB — TSH: TSH: 1.29 u[IU]/mL (ref 0.35–4.50)

## 2019-06-20 MED ORDER — MELOXICAM 7.5 MG PO TABS: 7.5000 mg | ORAL_TABLET | Freq: Every day | ORAL | 0 refills | Status: DC

## 2019-06-20 NOTE — Patient Instructions (Signed)
Please go to our [place] office ([address]) to get your xray done [facility] notify you of your lab results and make any changes if needed START the Meloxicam once daily- take w/ food- for pain/inflammation You can continue tylenol as needed ICE Continue to work on healthy diet and regular exercise Call with any questions or concerns Hang in there!

## 2019-06-21 ENCOUNTER — Telehealth: Payer: Self-pay | Admitting: Family Medicine

## 2019-06-21 ENCOUNTER — Other Ambulatory Visit: Payer: Self-pay | Admitting: Family Medicine

## 2019-06-21 DIAGNOSIS — R7989 Other specified abnormal findings of blood chemistry: Secondary | ICD-10-CM

## 2019-06-21 DIAGNOSIS — S62640A Nondisplaced fracture of proximal phalanx of right index finger, initial encounter for closed fracture: Secondary | ICD-10-CM

## 2019-06-21 DIAGNOSIS — M79644 Pain in right finger(s): Secondary | ICD-10-CM

## 2019-06-21 DIAGNOSIS — M19041 Primary osteoarthritis, right hand: Secondary | ICD-10-CM

## 2019-06-21 NOTE — Telephone Encounter (Signed)
Pt was advised and stated an understanding.

## 2019-06-21 NOTE — Telephone Encounter (Signed)
Patient was transferred to my line but when I answered, no one was there. I called the patient right back but no answer. Left a vm message for the patient to call the office back.

## 2019-06-21 NOTE — Telephone Encounter (Signed)
Patient has questions about the xray that was ordered and Dr. Virgil Benedict recommendation.  Also has question about low dose aspirin and miloxicam.  Please call

## 2019-06-21 NOTE — Telephone Encounter (Signed)
Patient called back in reference to taking the Meloxicam. She states it is not taking care of the pain and wanted to know how often she could take the tylenol for breakthrough pain. Please advise.

## 2019-06-21 NOTE — Telephone Encounter (Signed)
Pt can take Tylenol every 6 hrs as needed for breakthrough pain.  And a referral to hand specialist was place.  She should leave her finger splinted and continue to ice for pain relief

## 2019-06-21 NOTE — Telephone Encounter (Signed)
The Meloxicam is just for 7-10 days so this should be ok in the setting of a baby aspirin.  But she should not take the Meloxicam longer than that (increased risk of bleeding)

## 2019-06-21 NOTE — Progress Notes (Signed)
Called pt and lmovm to return call.

## 2019-06-21 NOTE — Telephone Encounter (Signed)
Called patient and LMOVM to return call.

## 2019-06-21 NOTE — Telephone Encounter (Signed)
I couldn't reach pt by phone. But I did see that she has aspiring 81mg  on her med list. Should she be taking this and the meloxicam?

## 2019-06-22 ENCOUNTER — Encounter: Payer: Self-pay | Admitting: Family Medicine

## 2019-06-24 ENCOUNTER — Telehealth: Payer: Self-pay | Admitting: Family Medicine

## 2019-06-24 DIAGNOSIS — M79644 Pain in right finger(s): Secondary | ICD-10-CM

## 2019-06-24 DIAGNOSIS — S62640A Nondisplaced fracture of proximal phalanx of right index finger, initial encounter for closed fracture: Secondary | ICD-10-CM

## 2019-06-24 NOTE — Telephone Encounter (Signed)
Referral was just placed on Friday, I will send the referral to [facility] at pt's request.

## 2019-06-24 NOTE — Telephone Encounter (Signed)
Patient called about the referral that was put in about her hand.  She prefers [facility] because she has dealt with them before.  Please call her at your convenience.

## 2019-06-24 NOTE — Addendum Note (Signed)
Addended by: Davis Gourd on: 06/24/2019 04:37 PM   Modules accepted: Orders

## 2019-06-24 NOTE — Telephone Encounter (Signed)
New referral placed. PCP said she will sign note in the morning.

## 2019-06-24 NOTE — Telephone Encounter (Signed)
Pt is calling to be seen asap, can we change the referral to urgent and have Tabori sign off on the office notes?

## 2019-06-24 NOTE — Telephone Encounter (Signed)
Ok to switch to Urgent referral and I will sign notes.  Or, [facility] also has walk-in clinic every day between 8-5

## 2019-06-24 NOTE — Telephone Encounter (Signed)
Pt is aware the referral has been sent and the she can walk into the walk-in clinic.

## 2019-06-24 NOTE — Telephone Encounter (Signed)
Please advise 

## 2019-06-25 NOTE — Assessment & Plan Note (Signed)
Chronic problem.  Not currently on medication and attempting to control w/ diet and exercise.  Check labs and start meds prn.

## 2019-06-25 NOTE — Assessment & Plan Note (Signed)
Chronic problem.  Currently asymptomatic.  Check labs.  Adjust meds prn  

## 2019-06-25 NOTE — Assessment & Plan Note (Signed)
Chronic problem.  Adequate control.  Asymptomatic at this time.  Check labs.  No anticipated med changes. 

## 2019-06-25 NOTE — Progress Notes (Signed)
° °  Subjective:    Patient ID: [name], female    DOB: 02/13/1950, 69 y.o.   MRN: [id number]  HPI Hypothyroid- chronic problem, on Levothyroxine 127mcg daily.  Denies excessive fatigue.  No changes to skin/hair/nails  Hyperlipidemia- last LDL 135.  Not currently on medication.  Pt is down 4 lbs since last visit.  No regular exercise.  Denies abd pain, N/V  HTN- chronic problem.  Pt was previously on Triamterene HCTZ 37.5/25mg  daily.  Now only on Lasix 20mg  daily.  No CP, SOB, HAs, visual changes, edema.  R index finger swelling- sxs started last week, worsened over the weekend.  No known injury.  Has hx of OA.  Very painful.  No improvement w/ tylenol, Aleve, or ice.  Mildly warm to touch.  No redness.  Pain is localized to PIP joint.   Review of Systems For ROS see HPI   This visit occurred during the SARS-CoV-2 public health emergency.  Safety protocols were in place, including screening questions prior to the visit, additional usage of staff PPE, and extensive cleaning of exam room while observing appropriate contact time as indicated for disinfecting solutions.       Objective:   Physical Exam AAOx3, NAD NCAT EOMI, PERRL No thyromegaly or nodularity, no cervical LAD RRR, normal S1/S2 CTAB No abd TTP, rebound or guarding Pulses +2 and symmetric No LE edema R index finger w/ impressive swelling of PIP joint, bruising, TTP, and inability to flex       Assessment & Plan:  R index finger swelling- impressive swelling of PIP joint w/ mild bruising and limited flexion.  Pt reports known arthritis, no hx of gout, and no known/reported injury.  Will get xray to assess and in the meantime, splint finger for pain relief.  Reviewed supportive care and red flags that should prompt return.  Pt expressed understanding and is in agreement w/ plan.

## 2019-06-27 ENCOUNTER — Ambulatory Visit (INDEPENDENT_AMBULATORY_CARE_PROVIDER_SITE_OTHER): Payer: Medicare Other

## 2019-06-27 ENCOUNTER — Other Ambulatory Visit: Payer: Self-pay

## 2019-06-27 DIAGNOSIS — R7989 Other specified abnormal findings of blood chemistry: Secondary | ICD-10-CM

## 2019-06-27 LAB — BASIC METABOLIC PANEL
BUN: 14 mg/dL (ref 6–23)
CO2: 29 mEq/L (ref 19–32)
Calcium: 9.4 mg/dL (ref 8.4–10.5)
Chloride: 95 mEq/L — ABNORMAL LOW (ref 96–112)
Creatinine, Ser: 0.76 mg/dL (ref 0.40–1.20)
GFR: 74.99 mL/min (ref >60.00)
Glucose, Bld: 124 mg/dL — ABNORMAL HIGH (ref 70–99)
Potassium: 4.1 mEq/L (ref 3.5–5.1)
Sodium: 132 mEq/L — ABNORMAL LOW (ref 135–145)

## 2019-06-28 DIAGNOSIS — M79644 Pain in right finger(s): Secondary | ICD-10-CM | POA: Diagnosis not present

## 2019-06-28 DIAGNOSIS — M13841 Other specified arthritis, right hand: Secondary | ICD-10-CM | POA: Diagnosis not present

## 2019-06-28 DIAGNOSIS — M19041 Primary osteoarthritis, right hand: Secondary | ICD-10-CM | POA: Insufficient documentation

## 2019-07-01 ENCOUNTER — Encounter: Payer: Self-pay | Admitting: Family Medicine

## 2019-07-03 ENCOUNTER — Telehealth (INDEPENDENT_AMBULATORY_CARE_PROVIDER_SITE_OTHER): Payer: Medicare Other | Admitting: Physician Assistant

## 2019-07-03 ENCOUNTER — Telehealth: Payer: Medicare Other | Admitting: Family Medicine

## 2019-07-03 ENCOUNTER — Other Ambulatory Visit: Payer: Self-pay

## 2019-07-03 ENCOUNTER — Encounter: Payer: Self-pay | Admitting: Physician Assistant

## 2019-07-03 DIAGNOSIS — J069 Acute upper respiratory infection, unspecified: Secondary | ICD-10-CM

## 2019-07-03 MED ORDER — LEVOCETIRIZINE DIHYDROCHLORIDE 5 MG PO TABS: 2.5000 mg | ORAL_TABLET | Freq: Every evening | ORAL | 0 refills | Status: DC

## 2019-07-03 MED ORDER — DOXYCYCLINE HYCLATE 100 MG PO TABS: 100.0000 mg | ORAL_TABLET | Freq: Two times a day (BID) | ORAL | 0 refills | Status: DC

## 2019-07-03 NOTE — Progress Notes (Signed)
Virtual Visit via Video   I connected with patient on 07/03/19 at  1:30 PM EDT by a video enabled telemedicine application and verified that I am speaking with the correct person using two identifiers.  Location patient: Home Location provider: [facility] Office Persons participating in the virtual visit: Patient, Provider, [place] (Patina Moore)  I discussed the limitations of evaluation and management by telemedicine and the availability of in person appointments. The patient expressed understanding and agreed to proceed.  Subjective:   HPI:   Patient presents via [place] today c/o URI symptoms starting Saturday evening -- nasal congestion. PND and sore throat with mild cough. Nots resolution of sore throat. States she was having sinus pressure without sinus pain, ear pain or tooth pain. Denies any fever, chills, aches. Denies loss of taste or smell. Denies chest congestion. Nasal congestion and pressure have substantially improved over the last 48 hours, still having symptoms at night. Has noted slight increase in reflux symptoms for which she has been taking TUMS. Is completing a medrol pack from Ortho for her lower back. Denies recent travel or sick contact. .  ROS:   See pertinent positives and negatives per HPI.  Patient Active Problem List   Diagnosis Date Noted  . DOE (dyspnea on exertion) 11/29/2017  . Postoperative anemia due to acute blood loss 08/22/2013  . OA (osteoarthritis) of hip 08/21/2013  . Family history of colonic polyps 11/08/2010  . Hypothyroidism 06/29/2007  . Hyperlipidemia LDL goal <130 06/29/2007  . Essential hypertension 06/29/2007  . Osteopenia 06/29/2007    Social History   Tobacco Use  . Smoking status: Former Smoker    Packs/day: 0.50    Years: 20.00    Pack years: 10.00    Types: Cigarettes    Quit date: 01/17/2001    Years since quitting: 18.4  . Smokeless tobacco: Never Used  Substance Use Topics  . Alcohol use: Yes     Alcohol/week: 5.0 standard drinks    Types: 5 Glasses of wine per week    Comment: wine    Current Outpatient Medications:  .  aspirin 81 MG tablet, Take 81 mg by mouth daily., Disp: , Rfl:  .  calcium carbonate (TUMS EX) 750 MG chewable tablet, Chew 1 tablet by mouth as needed for heartburn., Disp: , Rfl:  .  furosemide (LASIX) 20 MG tablet, Take 1 tablet (20 mg total) by mouth daily., Disp: 90 tablet, Rfl: 0 .  hydroxypropyl methylcellulose (ISOPTO TEARS) 2.5 % ophthalmic solution, Place 1 drop into both eyes 3 (three) times daily as needed for dry eyes., Disp: , Rfl:  .  levothyroxine (SYNTHROID) 100 MCG tablet, Take 1 tablet (100 mcg total) by mouth daily., Disp: 90 tablet, Rfl: 0 .  loperamide (IMODIUM A-D) 2 MG tablet, Take 2 mg by mouth as needed for diarrhea or loose stools., Disp: , Rfl:  .  meloxicam (MOBIC) 7.5 MG tablet, Take 1 tablet (7.5 mg total) by mouth daily., Disp: 30 tablet, Rfl: 0 .  Multiple Vitamins-Minerals (AIRBORNE PO), Take by mouth., Disp: , Rfl:  .  Olopatadine HCl 0.2 % SOLN, , Disp: , Rfl:  .  Omega-3 Fatty Acids (FISH OIL) 1000 MG CAPS, Take by mouth every other day., Disp: , Rfl:  .  Probiotic CAPS, Take 1 capsule by mouth daily., Disp: , Rfl:  .  sodium chloride (OCEAN) 0.65 % nasal spray, Place 1 spray into the nose 2 (two) times daily as needed for congestion. , Disp: ,  Rfl:  .  timolol (TIMOPTIC) 0.5 % ophthalmic solution, 1 drop every morning., Disp: , Rfl:  .  Travoprost, BAK Free, (TRAVATAN) 0.004 % SOLN ophthalmic solution, Place 1 drop into both eyes at bedtime., Disp: , Rfl:  .  Vitamin D, Cholecalciferol, 400 units CAPS, Take by mouth once a week., Disp: , Rfl:   Allergies  Allergen Reactions  . Penicillins     unknown  . Pneumovax 23 [Pneumococcal Vac Polyvalent] Other (See Comments)    Severe localized reaction w/ flu like symptoms  . Nickel Rash    Objective:   LMP 01/17/2001 (Approximate)   Patient is well-developed, well-nourished in  no acute distress.  Resting comfortably at home.  Head is normocephalic, atraumatic.  No labored breathing.  Speech is clear and coherent with logical content.  Patient is alert and oriented at baseline.   Assessment and Plan:   1. Viral URI with cough Symptom onset Saturday. Improved daily. Feeling much better today.Still with some clear nasal congestion with some blood-tinge likely from irritation. Afebrile. No SOB, chest pain, sinus pain. Will have her increase fluids and rest. Start 2.5 mg Xyzal to help with symptoms but prevent drowsiness (very sensitive to antihistamines historically). Start saline nasal rinse. Rx Flonase to use over the next few days to help with nasal congestion and sinus pressure. PND increasing her reflux. Start Famotidine 20 mg OTC instead of TUMS. Rx Doxycycline sent to pharmacy to be put on hold. She is to start if she notes an acute worsening of symptoms or if sinus pain develop. Strict return precautions reviewed with patient.     Leeanne Rio, PA-C 07/03/2019

## 2019-07-03 NOTE — Patient Instructions (Addendum)
Instructions sent to MyChart. C/o

## 2019-07-26 ENCOUNTER — Encounter: Payer: Self-pay | Admitting: Family Medicine

## 2019-07-26 DIAGNOSIS — M2550 Pain in unspecified joint: Secondary | ICD-10-CM

## 2019-07-26 DIAGNOSIS — R609 Edema, unspecified: Secondary | ICD-10-CM

## 2019-07-30 DIAGNOSIS — M13841 Other specified arthritis, right hand: Secondary | ICD-10-CM | POA: Diagnosis not present

## 2019-08-06 ENCOUNTER — Ambulatory Visit: Payer: Medicare Other

## 2019-08-09 ENCOUNTER — Telehealth: Payer: Self-pay | Admitting: Family Medicine

## 2019-08-09 NOTE — Telephone Encounter (Signed)
LVM that appt for 08/20/19 at 12:45pm was changed to [place] 08/27/19 at 12:45 pm.  Please call back to confirmed appt change.  Thanks

## 2019-08-15 ENCOUNTER — Other Ambulatory Visit: Payer: Self-pay | Admitting: Family Medicine

## 2019-08-15 DIAGNOSIS — E871 Hypo-osmolality and hyponatremia: Secondary | ICD-10-CM

## 2019-08-15 DIAGNOSIS — E039 Hypothyroidism, unspecified: Secondary | ICD-10-CM

## 2019-08-20 ENCOUNTER — Ambulatory Visit: Payer: Medicare Other

## 2019-08-27 ENCOUNTER — Ambulatory Visit: Payer: Medicare Other

## 2019-09-02 ENCOUNTER — Other Ambulatory Visit: Payer: Self-pay

## 2019-09-02 ENCOUNTER — Ambulatory Visit (INDEPENDENT_AMBULATORY_CARE_PROVIDER_SITE_OTHER): Payer: Medicare Other

## 2019-09-02 DIAGNOSIS — M2540 Effusion, unspecified joint: Secondary | ICD-10-CM | POA: Diagnosis not present

## 2019-09-02 LAB — URIC ACID: Uric Acid, Serum: 6.8 mg/dL (ref 2.4–7.0)

## 2019-09-03 ENCOUNTER — Encounter: Payer: Self-pay | Admitting: General Practice

## 2019-09-03 ENCOUNTER — Ambulatory Visit (INDEPENDENT_AMBULATORY_CARE_PROVIDER_SITE_OTHER): Payer: Medicare Other

## 2019-09-03 ENCOUNTER — Telehealth: Payer: Self-pay

## 2019-09-03 VITALS — Ht 65.0 in | Wt 154.0 lb

## 2019-09-03 DIAGNOSIS — Z0000 Encounter for general adult medical examination without abnormal findings: Secondary | ICD-10-CM

## 2019-09-03 NOTE — Telephone Encounter (Signed)
Pt would need an appt b/c she has not been seen for her foot or ankle.  She was seen for her finger but foot and ankle pain is new.

## 2019-09-03 NOTE — Telephone Encounter (Signed)
Please see if we can schedule pt.

## 2019-09-03 NOTE — Progress Notes (Signed)
Subjective:   [name] is a 69 y.o. female who presents for an Initial Medicare Annual Wellness Visit.  I connected with [name] today by telephone and verified that I am speaking with the correct person using two identifiers. Location patient: home Location provider: work Persons participating in the virtual visit: patient, Marine scientist.    I discussed the limitations, risks, security and privacy concerns of performing an evaluation and management service by telephone and the availability of in person appointments. I also discussed with the patient that there may be a patient responsible charge related to this service. The patient expressed understanding and verbally consented to this telephonic visit.    Interactive audio and video telecommunications were attempted between this provider and patient, however failed, due to patient having technical difficulties OR patient did not have access to video capability.  We continued and completed visit with audio only.  Some vital signs may be absent or patient reported.   Time Spent with patient on telephone encounter: 30 minutes  Review of Systems     Cardiac Risk Factors include: advanced age (>37mn, >>79women);dyslipidemia;hypertension     Objective:    Today's Vitals   09/03/19 0941 09/03/19 0942  Weight: 154 lb (69.9 kg)   Height: 5' 5" (1.651 m)   PainSc:  5    Body mass index is 25.63 kg/m.  Advanced Directives 09/03/2019 08/19/2017 08/19/2017 08/24/2013 08/21/2013 08/14/2013  Does Patient Have a Medical Advance Directive? Yes Yes Yes - Patient does not have advance directive Patient does not have advance directive  Type of Advance Directive [place] Living will;Healthcare Power of [place] - - -  Copy of [place] Chart? No - copy requested No - copy requested No - copy requested - - -  Pre-existing out of facility DNR order (yellow form or  pink MOST form) - - - No No No    Current Medications (verified) Outpatient Encounter Medications as of 09/03/2019  Medication Sig  . aspirin 81 MG tablet Take 81 mg by mouth daily.  . calcium carbonate (TUMS EX) 750 MG chewable tablet Chew 1 tablet by mouth as needed for heartburn.  . furosemide (LASIX) 20 MG tablet TAKE 1 TABLET(20 MG) BY MOUTH DAILY  . levothyroxine (SYNTHROID) 100 MCG tablet TAKE 1 TABLET(100 MCG) BY MOUTH DAILY  . loperamide (IMODIUM A-D) 2 MG tablet Take 2 mg by mouth as needed for diarrhea or loose stools.  . Multiple Vitamins-Minerals (AIRBORNE PO) Take by mouth.  . Omega-3 Fatty Acids (FISH OIL) 1000 MG CAPS Take by mouth every other day.  . Probiotic CAPS Take 1 capsule by mouth daily.  . timolol (TIMOPTIC) 0.5 % ophthalmic solution 1 drop every morning.  . Travoprost, BAK Free, (TRAVATAN) 0.004 % SOLN ophthalmic solution Place 1 drop into both eyes at bedtime.  . Vitamin D, Cholecalciferol, 400 units CAPS Take by mouth once a week.  . [DISCONTINUED] Olopatadine HCl 0.2 % SOLN   . levocetirizine (XYZAL) 5 MG tablet Take 0.5 tablets (2.5 mg total) by mouth every evening. (Patient not taking: Reported on 09/03/2019)  . methylPREDNISolone (MEDROL DOSEPAK) 4 MG TBPK tablet See admin instructions. follow package directions  . [DISCONTINUED] doxycycline (VIBRA-TABS) 100 MG tablet Take 1 tablet (100 mg total) by mouth 2 (two) times daily.  . [DISCONTINUED] hydroxypropyl methylcellulose (ISOPTO TEARS) 2.5 % ophthalmic solution Place 1 drop into both eyes 3 (three) times daily as needed for dry  eyes.   No facility-administered encounter medications on file as of 09/03/2019.    Allergies (verified) Penicillins, Pneumovax 23 [pneumococcal vac polyvalent], and Nickel   History: Past Medical History:  Diagnosis Date  . Anxiety   . Cancer ([facility])    basal skin cancer   . DJD (degenerative joint disease) of hip    right - [place]  . Dyslipidemia   . GERD (gastroesophageal  reflux disease)   . Glaucoma (increased eye pressure)   . Hypertension   . Hypothyroidism   . Osteopenia   . Pneumonia    hx of pneumonia - lst time 1998  . PONV (postoperative nausea and vomiting)   . Vitreous floaters of left eye 04/2013   Past Surgical History:  Procedure Laterality Date  . [place]   for pneumonia recurrent  . TONSILLECTOMY AND ADENOIDECTOMY  child age 26  . TOTAL HIP ARTHROPLASTY Right 08/21/2013   Procedure: RIGHT TOTAL HIP ARTHROPLASTY ANTERIOR APPROACH;  Surgeon: Gearlean Alf, MD;  Location: [facility];  Service: Orthopedics;  Laterality: Right;   Family History  Problem Relation Age of Onset  . Allergies Mother   . Lymphoma Mother   . Asthma Brother   . Colon polyps Brother   . Pulmonary fibrosis Brother 12       ideopathic  . Other Father        IHD  . Colon cancer Neg Hx   . Esophageal cancer Neg Hx   . Stomach cancer Neg Hx   . Rectal cancer Neg Hx   . Breast cancer Neg Hx    Social History   Socioeconomic History  . Marital status: Single    Spouse name: Not on file  . Number of children: 0  . Years of education: Not on file  . Highest education level: Not on file  Occupational History  . Occupation: LAWYER    Employer: [employer]  Tobacco Use  . Smoking status: Former Smoker    Packs/day: 0.50    Years: 20.00    Pack years: 10.00    Types: Cigarettes    Quit date: 01/17/2001    Years since quitting: 18.6  . Smokeless tobacco: Never Used  Vaping Use  . Vaping Use: Never used  Substance and Sexual Activity  . Alcohol use: Yes    Alcohol/week: 5.0 standard drinks    Types: 5 Glasses of wine per week    Comment: wine  . Drug use: No  . Sexual activity: Not on file  Other Topics Concern  . Not on file  Social History Narrative   Works in Pension scheme manager   Father anesthesiologist         Social Determinants of Health   Financial Resource Strain: [place]   . Difficulty of Paying Living Expenses: Not  hard at all  Food Insecurity: No Food Insecurity  . Worried About Charity fundraiser in the Last Year: Never true  . Ran Out of Food in the Last Year: Never true  Transportation Needs: No Transportation Needs  . Lack of Transportation (Medical): No  . Lack of Transportation (Non-Medical): No  Physical Activity: Insufficiently Active  . Days of Exercise per Week: 6 days  . Minutes of Exercise per Session: 10 min  Stress: No Stress Concern Present  . Feeling of Stress : Not at all  Social Connections: Moderately Integrated  . Frequency of Communication with Friends and Family: More than three times a week  .  Frequency of Social Gatherings with Friends and Family: More than three times a week  . Attends Religious Services: More than 4 times per year  . Active Member of Clubs or Organizations: Yes  . Attends Archivist Meetings: More than 4 times per year  . Marital Status: Never married    Tobacco Counseling Counseling given: Not Answered   Clinical Intake:  Pre-visit preparation completed: Yes  Pain : 0-10 Pain Score: 5  Pain Type: Acute pain Pain Location: Foot Pain Orientation: Left Pain Onset: In the past 7 days Pain Frequency: Constant Pain Relieving Factors: tylenol, aleve  Pain Relieving Factors: tylenol, aleve  Nutritional Status: BMI 25 -29 Overweight Nutritional Risks: None Diabetes: No  How often do you need to have someone help you when you read instructions, pamphlets, or other written materials from your doctor or pharmacy?: 1 - Never What is the last grade level you completed in school?: Law school  Diabetic?No  Interpreter Needed?: No  Information entered by :: Caroleen Hamman LPN   Activities of Daily Living In your present state of health, do you have any difficulty performing the following activities: 09/03/2019 06/20/2019  Hearing? N N  Vision? N N  Difficulty concentrating or making decisions? N N  Comment occasionally forgets names  -  Walking or climbing stairs? N N  Dressing or bathing? N N  Doing errands, shopping? N N  Preparing Food and eating ? N -  Using the Toilet? N -  In the past six months, have you accidently leaked urine? N -  Do you have problems with loss of bowel control? N -  Managing your Medications? N -  Managing your Finances? N -  Housekeeping or managing your Housekeeping? N -  Some recent data might be hidden    Patient Care Team: Midge Minium, MD as PCP - General (Family Medicine) Gatha Mayer, MD as Consulting Physician (Gastroenterology) [name], MD as Consulting Physician (Pulmonary Disease) Shawnie Dapper, DO as Consulting Physician (Osteopathic Medicine)  Indicate any recent Medical Services you may have received from other than [facility] providers in the past year (date may be approximate).     Assessment:   This is a routine wellness examination for [place].  Hearing/Vision screen  Hearing Screening   125Hz 250Hz 500Hz 1000Hz 2000Hz 3000Hz 4000Hz 6000Hz 8000Hz  Right ear:           Left ear:           Comments: Mild hearing loss  Vision Screening Comments: Lasik eye surgery Has Glaucoma & Cataracts Last eye exam -06/2019-[facility]  Dietary issues and exercise activities discussed: Current Exercise Habits: Home exercise routine, Type of exercise: yoga, Time (Minutes): 10, Frequency (Times/Week): 7, Weekly Exercise (Minutes/Week): 70, Intensity: Mild, Exercise limited by: None identified  Goals    . Patient Stated     Would like to start walking.      Depression Screen PHQ 2/9 Scores 09/03/2019 06/20/2019 04/06/2018 01/24/2018  PHQ - 2 Score 1 2 0 0  PHQ- 9 Score - 2 - 0    Fall Risk Fall Risk  09/03/2019 06/20/2019 04/06/2018 01/24/2018  Falls in the past year? 0 0 0 0  Number falls in past yr: 0 0 0 -  Injury with Fall? 0 0 0 -  Follow up Falls prevention discussed Falls evaluation completed - -    Any stairs in or around the home? Yes  If so,  are there  any without handrails? No  Home free of loose throw rugs in walkways, pet beds, electrical cords, etc? Yes  Adequate lighting in your home to reduce risk of falls? Yes   ASSISTIVE DEVICES UTILIZED TO PREVENT FALLS:  Life alert? No  Use of a cane, walker or w/c? No  Grab bars in the bathroom? No  Shower chair or bench in shower? No  Elevated toilet seat or a handicapped toilet? No   TIMED UP AND GO:  Was the test performed? No . Phone visit   Cognitive Function: No cognitive impairment noted. Does puzzles for brain health.        Immunizations Immunization History  Administered Date(s) Administered  . Fluad Quad(high Dose 65+) 11/05/2018  . Influenza, High Dose Seasonal PF 10/21/2016, 11/29/2017  . PFIZER SARS-COV-2 Vaccination 03/14/2019, 04/09/2019  . Pneumococcal Conjugate-13 02/12/2014  . Td 06/26/2008    TDAP status: Due, Education has been provided regarding the importance of this vaccine. Advised may receive this vaccine at local pharmacy or Health Dept. Aware to provide a copy of the vaccination record if obtained from local pharmacy or Health Dept. Verbalized acceptance and understanding.   Flu Vaccine status: Up to date Due 09/2019  Pneumococcal vaccine status: Up to date   Covid-19 vaccine status: Completed vaccines  Qualifies for Shingles Vaccine? No  Patient states she has never had Chicken Pox   Screening Tests Health Maintenance  Topic Date Due  . DEXA SCAN  Never done  . INFLUENZA VACCINE  08/18/2019  . TETANUS/TDAP  06/19/2020 (Originally 06/27/2018)  . Hepatitis C Screening  06/19/2020 (Originally Nov 04, 1950)  . MAMMOGRAM  12/29/2019  . COLONOSCOPY  09/22/2027  . COVID-19 Vaccine  Completed    Health Maintenance  Health Maintenance Due  Topic Date Due  . DEXA SCAN  Never done  . INFLUENZA VACCINE  08/18/2019    Colorectal cancer screening: Completed 9/5/20109. Repeat every 10 years   Mammogram Status: Declined at this time.  Patient to call the office when she is ready to schedule.  Bone density Status:Declined at this time. Patient to call the office when she is ready to schedule.  Lung Cancer Screening: (Low Dose CT Chest recommended if Age 56-80 years, 30 pack-year currently smoking OR have quit w/in 15years.) does not qualify.    Additional Screening:  Hepatitis C Screening: does qualify; discuss with PCP  Vision Screening: Recommended annual ophthalmology exams for early detection of glaucoma and other disorders of the eye. Is the patient up to date with their annual eye exam?  Yes  Who is the provider or what is the name of the office in which the patient attends annual eye exams? [place] Screening: Recommended annual dental exams for proper oral hygiene  Community Resource Referral / Chronic Care Management: CRR required this visit?  No   CCM required this visit?  No      Plan:     I have personally reviewed and noted the following in the patient's chart:   . Medical and social history . Use of alcohol, tobacco or illicit drugs  . Current medications and supplements . Functional ability and status . Nutritional status . Physical activity . Advanced directives . List of other physicians . Hospitalizations, surgeries, and ER visits in previous 12 months . Vitals . Screenings to include cognitive, depression, and falls . Referrals and appointments  In addition, I have reviewed and discussed with patient certain preventive protocols, quality metrics, and best practice  recommendations. A written personalized care plan for preventive services as well as general preventive health recommendations were provided to patient.  Due to this being a telephonic visit, the after visit summary with patients personalized plan was offered to patient via mail or my-chart.  Patient would like to access on my-chart.  Marta Antu, LPN   [id number]  Nurse Health Advisor  Nurse  Notes: Patient states she was seen yesterday for foot Pain & continues to have foot pain & swelling in her foot & ankle. Message sent to PCP.

## 2019-09-03 NOTE — Patient Instructions (Signed)
[name] , Thank you for taking time to complete your Medicare Wellness Visit. I appreciate your ongoing commitment to your health goals. Please review the following plan we discussed and let me know if I can assist you in the future.   Screening recommendations/referrals: Colonoscopy: Completed 09/21/2017- Due 09/22/2027 Mammogram: Completed 12/28/2017- Due-Declined at this time. Please call the office when you are ready to schedule. Bone Density: Due-Declined at this time. Please call the office when you are ready to schedule. May be schedules at the same time as mammogram. Recommended yearly ophthalmology/optometry visit for glaucoma screening and checkup Recommended yearly dental visit for hygiene and checkup  Vaccinations: Influenza vaccine: Due 09/2019 Pneumococcal vaccine: Completed Tdap vaccine: Discuss with pharmacy Shingles vaccine: Per our discussion today, never had Chicken Pox. Not indicated Covid-19:Completed vaccines.   Advanced directives: Bring a copy to your next office visit.  Conditions/risks identified: See problem list  Message sent to Dr. Birdie Riddle regarding your foot.  Next appointment: Follow up in one year for your annual wellness visit    Preventive Care 65 Years and Older, Female Preventive care refers to lifestyle choices and visits with your health care provider that can promote health and wellness. What does preventive care include?  A yearly physical exam. This is also called an annual well check.  Dental exams once or twice a year.  Routine eye exams. Ask your health care provider how often you should have your eyes checked.  Personal lifestyle choices, including:  Daily care of your teeth and gums.  Regular physical activity.  Eating a healthy diet.  Avoiding tobacco and drug use.  Limiting alcohol use.  Practicing safe sex.  Taking low-dose aspirin every day.  Taking vitamin and mineral supplements as recommended by your health care  provider. What happens during an annual well check? The services and screenings done by your health care provider during your annual well check will depend on your age, overall health, lifestyle risk factors, and family history of disease. Counseling  Your health care provider may ask you questions about your:  Alcohol use.  Tobacco use.  Drug use.  Emotional well-being.  Home and relationship well-being.  Sexual activity.  Eating habits.  History of falls.  Memory and ability to understand (cognition).  Work and work Statistician.  Reproductive health. Screening  You may have the following tests or measurements:  Height, weight, and BMI.  Blood pressure.  Lipid and cholesterol levels. These may be checked every 5 years, or more frequently if you are over 40 years old.  Skin check.  Lung cancer screening. You may have this screening every year starting at age 58 if you have a 30-pack-year history of smoking and currently smoke or have quit within the past 15 years.  Fecal occult blood test (FOBT) of the stool. You may have this test every year starting at age 51.  Flexible sigmoidoscopy or colonoscopy. You may have a sigmoidoscopy every 5 years or a colonoscopy every 10 years starting at age 68.  Hepatitis C blood test.  Hepatitis B blood test.  Sexually transmitted disease (STD) testing.  Diabetes screening. This is done by checking your blood sugar (glucose) after you have not eaten for a while (fasting). You may have this done every 1-3 years.  Bone density scan. This is done to screen for osteoporosis. You may have this done starting at age 36.  Mammogram. This may be done every 1-2 years. Talk to your health care provider about how often  you should have regular mammograms. Talk with your health care provider about your test results, treatment options, and if necessary, the need for more tests. Vaccines  Your health care provider may recommend certain  vaccines, such as:  Influenza vaccine. This is recommended every year.  Tetanus, diphtheria, and acellular pertussis (Tdap, Td) vaccine. You may need a Td booster every 10 years.  Zoster vaccine. You may need this after age 36.  Pneumococcal 13-valent conjugate (PCV13) vaccine. One dose is recommended after age 39.  Pneumococcal polysaccharide (PPSV23) vaccine. One dose is recommended after age 79. Talk to your health care provider about which screenings and vaccines you need and how often you need them. This information is not intended to replace advice given to you by your health care provider. Make sure you discuss any questions you have with your health care provider. Document Released: 01/30/2015 Document Revised: 09/23/2015 Document Reviewed: 11/04/2014 Elsevier Interactive Patient Education  2017 [place] Prevention in the Home Falls can cause injuries. They can happen to people of all ages. There are many things you can do to make your home safe and to help prevent falls. What can I do on the outside of my home?  Regularly fix the edges of walkways and driveways and fix any cracks.  Remove anything that might make you trip as you walk through a door, such as a raised step or threshold.  Trim any bushes or trees on the path to your home.  Use bright outdoor lighting.  Clear any walking paths of anything that might make someone trip, such as rocks or tools.  Regularly check to see if handrails are loose or broken. Make sure that both sides of any steps have handrails.  Any raised decks and porches should have guardrails on the edges.  Have any leaves, snow, or ice cleared regularly.  Use sand or salt on walking paths during winter.  Clean up any spills in your garage right away. This includes oil or grease spills. What can I do in the bathroom?  Use night lights.  Install grab bars by the toilet and in the tub and shower. Do not use towel bars as grab  bars.  Use non-skid mats or decals in the tub or shower.  If you need to sit down in the shower, use a plastic, non-slip stool.  Keep the floor dry. Clean up any water that spills on the floor as soon as it happens.  Remove soap buildup in the tub or shower regularly.  Attach bath mats securely with double-sided non-slip rug tape.  Do not have throw rugs and other things on the floor that can make you trip. What can I do in the bedroom?  Use night lights.  Make sure that you have a light by your bed that is easy to reach.  Do not use any sheets or blankets that are too big for your bed. They should not hang down onto the floor.  Have a firm chair that has side arms. You can use this for support while you get dressed.  Do not have throw rugs and other things on the floor that can make you trip. What can I do in the kitchen?  Clean up any spills right away.  Avoid walking on wet floors.  Keep items that you use a lot in easy-to-reach places.  If you need to reach something above you, use a strong step stool that has a grab bar.  Keep electrical cords  out of the way.  Do not use floor polish or wax that makes floors slippery. If you must use wax, use non-skid floor wax.  Do not have throw rugs and other things on the floor that can make you trip. What can I do with my stairs?  Do not leave any items on the stairs.  Make sure that there are handrails on both sides of the stairs and use them. Fix handrails that are broken or loose. Make sure that handrails are as long as the stairways.  Check any carpeting to make sure that it is firmly attached to the stairs. Fix any carpet that is loose or worn.  Avoid having throw rugs at the top or bottom of the stairs. If you do have throw rugs, attach them to the floor with carpet tape.  Make sure that you have a light switch at the top of the stairs and the bottom of the stairs. If you do not have them, ask someone to add them for  you. What else can I do to help prevent falls?  Wear shoes that:  Do not have high heels.  Have rubber bottoms.  Are comfortable and fit you well.  Are closed at the toe. Do not wear sandals.  If you use a stepladder:  Make sure that it is fully opened. Do not climb a closed stepladder.  Make sure that both sides of the stepladder are locked into place.  Ask someone to hold it for you, if possible.  Clearly mark and make sure that you can see:  Any grab bars or handrails.  First and last steps.  Where the edge of each step is.  Use tools that help you move around (mobility aids) if they are needed. These include:  Canes.  Walkers.  Scooters.  Crutches.  Turn on the lights when you go into a dark area. Replace any light bulbs as soon as they burn out.  Set up your furniture so you have a clear path. Avoid moving your furniture around.  If any of your floors are uneven, fix them.  If there are any pets around you, be aware of where they are.  Review your medicines with your doctor. Some medicines can make you feel dizzy. This can increase your chance of falling. Ask your doctor what other things that you can do to help prevent falls. This information is not intended to replace advice given to you by your health care provider. Make sure you discuss any questions you have with your health care provider. Document Released: 10/30/2008 Document Revised: 06/11/2015 Document Reviewed: 02/07/2014 Elsevier Interactive Patient Education  2017 [employer].

## 2019-09-04 ENCOUNTER — Encounter: Payer: Self-pay | Admitting: Family Medicine

## 2019-09-19 ENCOUNTER — Ambulatory Visit (INDEPENDENT_AMBULATORY_CARE_PROVIDER_SITE_OTHER): Payer: Medicare Other

## 2019-09-19 ENCOUNTER — Ambulatory Visit (INDEPENDENT_AMBULATORY_CARE_PROVIDER_SITE_OTHER): Payer: Medicare Other | Admitting: Podiatry

## 2019-09-19 ENCOUNTER — Encounter: Payer: Self-pay | Admitting: Podiatry

## 2019-09-19 ENCOUNTER — Other Ambulatory Visit: Payer: Self-pay

## 2019-09-19 DIAGNOSIS — M109 Gout, unspecified: Secondary | ICD-10-CM | POA: Diagnosis not present

## 2019-09-19 DIAGNOSIS — M778 Other enthesopathies, not elsewhere classified: Secondary | ICD-10-CM

## 2019-09-19 NOTE — Progress Notes (Signed)
She presents today after having not seen her for about 3 years with a chief complaint of the red swollen first metatarsophalangeal joint.  States that is red and swollen since August 12 and states that is doing better today than it has been.  Objective: Vital signs are stable she alert oriented x3 the left foot does demonstrate some mild erythema no cellulitis drainage or odor.  Mildly tender on palpation as well as with range of motion.  Radiographs taken today demonstrate soft tissue swelling along medial aspect of the foot though objectively it appears to be a bunion deformity it is actually not a soft tissue swelling with probable gouty tophi medially.  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  Plan: At this point we are not going to treat this since it is improving however I would like to get blood work the next time she calls in make sure that we do a CBC CMP and an arthritic profile.

## 2019-09-30 ENCOUNTER — Encounter: Payer: Self-pay | Admitting: Family Medicine

## 2019-09-30 DIAGNOSIS — R300 Dysuria: Secondary | ICD-10-CM | POA: Diagnosis not present

## 2019-10-03 ENCOUNTER — Encounter: Payer: Self-pay | Admitting: Family Medicine

## 2019-10-07 ENCOUNTER — Telehealth: Payer: Self-pay | Admitting: Family Medicine

## 2019-10-07 NOTE — Progress Notes (Signed)
  Chronic Care Management   Outreach Note  10/07/2019 Name: Serine Kea MRN: [id number] DOB: [birth year]  Referred by: Midge Minium, MD Reason for referral : No chief complaint on file.   An unsuccessful telephone outreach was attempted today. The patient was referred to the pharmacist for assistance with care management and care coordination.   Follow Up Plan:   Lauretta Grill Upstream Scheduler

## 2019-10-07 NOTE — Chronic Care Management (AMB) (Signed)
°  Chronic Care Management   Note  10/07/2019 Name: [name] MRN: [id number] DOB: 1950-02-29  [name] is a 69 y.o. year old female who is a primary care patient of [name], Madeline Millet, [name]. I reached out to [name] by phone today in response to a referral sent by [name] PCP, Madeline Minium, [name].   [name] was given information about Chronic Care Management services today including:  1. CCM service includes personalized support from designated clinical staff supervised by her physician, including individualized plan of care and coordination with other care providers 2. 24/7 contact phone numbers for assistance for urgent and routine care needs. 3. Service will only be billed when office clinical staff spend 20 minutes or more in a month to coordinate care. 4. Only one practitioner may furnish and bill the service in a calendar month. 5. The patient may stop CCM services at any time (effective at the end of the month) by phone call to the office staff.   Patient agreed to services and verbal consent obtained.   Follow up plan:   [name] Upstream Scheduler

## 2019-10-22 ENCOUNTER — Ambulatory Visit: Payer: Medicare Other | Admitting: Family Medicine

## 2019-11-05 DIAGNOSIS — Z85828 Personal history of other malignant neoplasm of skin: Secondary | ICD-10-CM | POA: Diagnosis not present

## 2019-11-05 DIAGNOSIS — D2261 Melanocytic nevi of right upper limb, including shoulder: Secondary | ICD-10-CM | POA: Diagnosis not present

## 2019-11-05 DIAGNOSIS — L821 Other seborrheic keratosis: Secondary | ICD-10-CM | POA: Diagnosis not present

## 2019-11-05 DIAGNOSIS — D1801 Hemangioma of skin and subcutaneous tissue: Secondary | ICD-10-CM | POA: Diagnosis not present

## 2019-11-05 DIAGNOSIS — D2262 Melanocytic nevi of left upper limb, including shoulder: Secondary | ICD-10-CM | POA: Diagnosis not present

## 2019-11-05 DIAGNOSIS — L720 Epidermal cyst: Secondary | ICD-10-CM | POA: Diagnosis not present

## 2019-11-05 DIAGNOSIS — L918 Other hypertrophic disorders of the skin: Secondary | ICD-10-CM | POA: Diagnosis not present

## 2019-11-08 ENCOUNTER — Telehealth: Payer: Medicare Other

## 2019-11-18 ENCOUNTER — Other Ambulatory Visit: Payer: Self-pay | Admitting: Family Medicine

## 2019-11-18 DIAGNOSIS — E039 Hypothyroidism, unspecified: Secondary | ICD-10-CM

## 2019-11-18 DIAGNOSIS — E871 Hypo-osmolality and hyponatremia: Secondary | ICD-10-CM

## 2019-11-19 DIAGNOSIS — H401132 Primary open-angle glaucoma, bilateral, moderate stage: Secondary | ICD-10-CM | POA: Diagnosis not present

## 2019-11-19 DIAGNOSIS — H2513 Age-related nuclear cataract, bilateral: Secondary | ICD-10-CM | POA: Diagnosis not present

## 2019-11-19 DIAGNOSIS — H10413 Chronic giant papillary conjunctivitis, bilateral: Secondary | ICD-10-CM | POA: Diagnosis not present

## 2019-11-21 ENCOUNTER — Other Ambulatory Visit: Payer: Self-pay | Admitting: Family Medicine

## 2019-11-21 DIAGNOSIS — E039 Hypothyroidism, unspecified: Secondary | ICD-10-CM

## 2020-01-01 ENCOUNTER — Encounter: Payer: Self-pay | Admitting: Family Medicine

## 2020-01-06 ENCOUNTER — Other Ambulatory Visit: Payer: Medicare Other

## 2020-01-06 DIAGNOSIS — Z03818 Encounter for observation for suspected exposure to other biological agents ruled out: Secondary | ICD-10-CM | POA: Diagnosis not present

## 2020-01-06 DIAGNOSIS — S0031XA Abrasion of nose, initial encounter: Secondary | ICD-10-CM | POA: Diagnosis not present

## 2020-01-06 DIAGNOSIS — Z20822 Contact with and (suspected) exposure to covid-19: Secondary | ICD-10-CM | POA: Diagnosis not present

## 2020-01-06 DIAGNOSIS — I10 Essential (primary) hypertension: Secondary | ICD-10-CM | POA: Diagnosis not present

## 2020-01-09 ENCOUNTER — Telehealth (INDEPENDENT_AMBULATORY_CARE_PROVIDER_SITE_OTHER): Payer: Medicare Other | Admitting: Family Medicine

## 2020-01-09 ENCOUNTER — Encounter: Payer: Self-pay | Admitting: Family Medicine

## 2020-01-09 DIAGNOSIS — J329 Chronic sinusitis, unspecified: Secondary | ICD-10-CM

## 2020-01-09 DIAGNOSIS — B9689 Other specified bacterial agents as the cause of diseases classified elsewhere: Secondary | ICD-10-CM | POA: Diagnosis not present

## 2020-01-09 MED ORDER — DOXYCYCLINE HYCLATE 100 MG PO TABS: 100.0000 mg | ORAL_TABLET | Freq: Two times a day (BID) | ORAL | 0 refills | Status: DC

## 2020-01-09 NOTE — Progress Notes (Signed)
Virtual Visit via Video   I connected with patient on 01/09/20 at  9:30 AM EST by a video enabled telemedicine application and verified that I am speaking with the correct person using two identifiers.  Location patient: Home Location provider: [place] [facility], Office Persons participating in the virtual visit: Patient, Provider, CMA (Sabrina M)  I discussed the limitations of evaluation and management by telemedicine and the availability of in person appointments. The patient expressed understanding and agreed to proceed.  Subjective:   HPI:   Congestion- 2 weeks ago developed nose bleeds, 'but I didn't feel bad'.  Last week she developed congestion and decided to get COVID test at [facility] on Monday.  Tested negative.  Yesterday developed HA, facial pressure- 'my face felt really full'.  Denies fever, chills, body aches.  Today she reports feeling somewhat better.  No change in chronic cough.  No known sick contacts.  She is very concerned about going into the holiday weekend w/o abx.  ROS:   See pertinent positives and negatives per HPI.  Patient Active Problem List   Diagnosis Date Noted  . Gout attack 09/19/2019  . Arthritis of finger of right hand 06/28/2019  . DOE (dyspnea on exertion) 11/29/2017  . Postoperative anemia due to acute blood loss 08/22/2013  . OA (osteoarthritis) of hip 08/21/2013  . Family history of colonic polyps 11/08/2010  . Hypothyroidism 06/29/2007  . Hyperlipidemia LDL goal <130 06/29/2007  . Essential hypertension 06/29/2007  . Osteopenia 06/29/2007    Social History   Tobacco Use  . Smoking status: Former Smoker    Packs/day: 0.50    Years: 20.00    Pack years: 10.00    Types: Cigarettes    Quit date: 01/17/2001    Years since quitting: 18.9  . Smokeless tobacco: Never Used  Substance Use Topics  . Alcohol use: Yes    Alcohol/week: 5.0 standard drinks    Types: 5 Glasses of wine per week    Comment: wine    Current  Outpatient Medications:  .  aspirin 81 MG tablet, Take 81 mg by mouth daily., Disp: , Rfl:  .  furosemide (LASIX) 20 MG tablet, TAKE 1 TABLET(20 MG) BY MOUTH DAILY, Disp: 90 tablet, Rfl: 0 .  levothyroxine (SYNTHROID) 100 MCG tablet, TAKE 1 TABLET(100 MCG) BY MOUTH DAILY, Disp: 90 tablet, Rfl: 0 .  loperamide (IMODIUM A-D) 2 MG tablet, Take 2 mg by mouth as needed for diarrhea or loose stools., Disp: , Rfl:  .  Multiple Vitamins-Minerals (AIRBORNE PO), Take by mouth., Disp: , Rfl:  .  Omega-3 Fatty Acids (FISH OIL) 1000 MG CAPS, Take by mouth every other day., Disp: , Rfl:  .  Probiotic CAPS, Take 1 capsule by mouth daily., Disp: , Rfl:  .  timolol (TIMOPTIC) 0.5 % ophthalmic solution, 1 drop every morning., Disp: , Rfl:  .  Travoprost, BAK Free, (TRAVATAN) 0.004 % SOLN ophthalmic solution, Place 1 drop into both eyes at bedtime., Disp: , Rfl:  .  Vitamin D, Cholecalciferol, 400 units CAPS, Take by mouth once a week., Disp: , Rfl:  .  calcium carbonate (TUMS EX) 750 MG chewable tablet, Chew 1 tablet by mouth as needed for heartburn. (Patient not taking: Reported on 01/09/2020), Disp: , Rfl:  .  levocetirizine (XYZAL) 5 MG tablet, Take 0.5 tablets (2.5 mg total) by mouth every evening. (Patient not taking: Reported on 09/03/2019), Disp: 30 tablet, Rfl: 0 .  methylPREDNISolone (MEDROL DOSEPAK) 4 MG TBPK tablet, See  admin instructions. follow package directions, Disp: , Rfl:   Allergies  Allergen Reactions  . Penicillins     unknown  . Pneumovax 23 [Pneumococcal Vac Polyvalent] Other (See Comments)    Severe localized reaction w/ flu like symptoms  . Nickel Rash    Objective:   LMP 01/17/2001 (Approximate)  AAOx3, NAD NCAT, EOMI No obvious CN deficits Pt is able to speak clearly, coherently without shortness of breath or increased work of breathing.  Thought process is linear.  Mood is appropriate.   Assessment and Plan:   Bacterial Sinusitis- reviewed symptoms w/ pt and that she  should hold on abx use unless she develops fever, worsening pain/pressure, or purulent nasal drainage.  Encouraged her to restart daily antihistamine, apply Vaseline to her bleeding nostril, drink plenty of fluids, and rest.  Reviewed supportive care and red flags that should prompt return.  Pt expressed understanding and is in agreement w/ plan.   Annye Asa, MD 01/09/2020

## 2020-01-09 NOTE — Progress Notes (Signed)
I connected with  [name] on 01/09/20 by a video enabled telemedicine application and verified that I am speaking with the correct person using two identifiers.   I discussed the limitations of evaluation and management by telemedicine. The patient expressed understanding and agreed to proceed.

## 2020-02-13 ENCOUNTER — Other Ambulatory Visit: Payer: Self-pay | Admitting: Family Medicine

## 2020-02-13 ENCOUNTER — Encounter: Payer: Self-pay | Admitting: Family Medicine

## 2020-02-13 DIAGNOSIS — E871 Hypo-osmolality and hyponatremia: Secondary | ICD-10-CM

## 2020-02-13 DIAGNOSIS — E039 Hypothyroidism, unspecified: Secondary | ICD-10-CM

## 2020-02-21 ENCOUNTER — Ambulatory Visit (INDEPENDENT_AMBULATORY_CARE_PROVIDER_SITE_OTHER): Payer: Medicare Other | Admitting: Family Medicine

## 2020-02-21 ENCOUNTER — Encounter: Payer: Self-pay | Admitting: Family Medicine

## 2020-02-21 ENCOUNTER — Other Ambulatory Visit: Payer: Self-pay

## 2020-02-21 VITALS — BP 130/80 | HR 77 | Temp 99.0°F | Resp 20 | Ht 65.0 in | Wt 153.2 lb

## 2020-02-21 DIAGNOSIS — E039 Hypothyroidism, unspecified: Secondary | ICD-10-CM | POA: Diagnosis not present

## 2020-02-21 DIAGNOSIS — E785 Hyperlipidemia, unspecified: Secondary | ICD-10-CM

## 2020-02-21 DIAGNOSIS — I10 Essential (primary) hypertension: Secondary | ICD-10-CM | POA: Diagnosis not present

## 2020-02-21 NOTE — Patient Instructions (Addendum)
Follow up in 6 months to recheck BP and cholesterol We'll notify you of your lab results and make any changes if needed Continue to work on healthy diet and regular exercise- you're doing great! Consider a home BP cuff- [employer], [employer] Call with any questions or concerns Stay Safe!  Stay Healthy!

## 2020-02-21 NOTE — Progress Notes (Signed)
   Subjective:    Patient ID: [name], female    DOB: 1950-11-21, 70 y.o.   MRN: [id number]  HPI HTN- chronic problem, on Furosemide 20mg  daily w/ good BP control.  Pt reports she has started exercising again and has been feeling good.  Pt reports she had a day that she was very upset, was overheated and BP was elevated at 172/93.  Pt doesn't have BP cuff at home.  Denies current CP, SOB, HAs, visual changes, edema.  Hypothyroid- on levothyroxine 146mcg daily.  Denies excessive fatigue, changes to skin/hair/nails  Hyperlipidemia- last LDL 122.  Has returned to exercising regularly.  Denies abd pain, N/V   Review of Systems For ROS see HPI   This visit occurred during the SARS-CoV-2 public health emergency.  Safety protocols were in place, including screening questions prior to the visit, additional usage of staff PPE, and extensive cleaning of exam room while observing appropriate contact time as indicated for disinfecting solutions.       Objective:   Physical Exam Vitals reviewed.  Constitutional:      General: She is not in acute distress.    Appearance: Normal appearance. She is well-developed and well-nourished. She is not ill-appearing.  HENT:     Head: Normocephalic and atraumatic.  Eyes:     Extraocular Movements: EOM normal.     Conjunctiva/sclera: Conjunctivae normal.     Pupils: Pupils are equal, round, and reactive to light.  Neck:     Thyroid: No thyromegaly.  Cardiovascular:     Rate and Rhythm: Normal rate and regular rhythm.     Pulses: Intact distal pulses.     Heart sounds: Normal heart sounds. No murmur heard.   Pulmonary:     Effort: Pulmonary effort is normal. No respiratory distress.     Breath sounds: Normal breath sounds.  Abdominal:     General: There is no distension.     Palpations: Abdomen is soft.     Tenderness: There is no abdominal tenderness.  Musculoskeletal:        General: No edema.     Cervical back: Normal range of motion and  neck supple.  Lymphadenopathy:     Cervical: No cervical adenopathy.  Skin:    General: Skin is warm and dry.  Neurological:     Mental Status: She is alert and oriented to person, place, and time.  Psychiatric:        Mood and Affect: Mood and affect normal.        Behavior: Behavior normal.           Assessment & Plan:

## 2020-02-22 LAB — BASIC METABOLIC PANEL
BUN: 17 mg/dL (ref 7–25)
CO2: 30 mmol/L (ref 20–32)
Calcium: 9.9 mg/dL (ref 8.6–10.4)
Chloride: 97 mmol/L — ABNORMAL LOW (ref 98–110)
Creat: 0.69 mg/dL (ref 0.60–0.93)
Glucose, Bld: 87 mg/dL (ref 65–99)
Potassium: 4 mmol/L (ref 3.5–5.3)
Sodium: 136 mmol/L (ref 135–146)

## 2020-02-22 LAB — CBC WITH DIFFERENTIAL/PLATELET
Absolute Monocytes: 811 cells/uL (ref 200–950)
Basophils Absolute: 43 cells/uL (ref 0–200)
Basophils Relative: 0.7 %
Eosinophils Absolute: 140 cells/uL (ref 15–500)
Eosinophils Relative: 2.3 %
HCT: 44 % (ref 35.0–45.0)
Hemoglobin: 15 g/dL (ref 11.7–15.5)
Lymphs Abs: 1568 cells/uL (ref 850–3900)
MCH: 30.2 pg (ref 27.0–33.0)
MCHC: 34.1 g/dL (ref 32.0–36.0)
MCV: 88.7 fL (ref 80.0–100.0)
MPV: 9.5 fL (ref 7.5–12.5)
Monocytes Relative: 13.3 %
Neutro Abs: 3538 cells/uL (ref 1500–7800)
Neutrophils Relative %: 58 %
Platelets: 250 10*3/uL (ref 140–400)
RBC: 4.96 10*6/uL (ref 3.80–5.10)
RDW: 12.7 % (ref 11.0–15.0)
Total Lymphocyte: 25.7 %
WBC: 6.1 10*3/uL (ref 3.8–10.8)

## 2020-02-22 LAB — HEPATIC FUNCTION PANEL
AG Ratio: 1.8 (calc) (ref 1.0–2.5)
ALT: 18 U/L (ref 6–29)
AST: 25 U/L (ref 10–35)
Albumin: 4.9 g/dL (ref 3.6–5.1)
Alkaline phosphatase (APISO): 68 U/L (ref 37–153)
Bilirubin, Direct: 0.1 mg/dL (ref 0.0–0.2)
Globulin: 2.8 g/dL (calc) (ref 1.9–3.7)
Indirect Bilirubin: 0.4 mg/dL (calc) (ref 0.2–1.2)
Total Bilirubin: 0.5 mg/dL (ref 0.2–1.2)
Total Protein: 7.7 g/dL (ref 6.1–8.1)

## 2020-02-22 LAB — TSH: TSH: 2.83 mIU/L (ref 0.40–4.50)

## 2020-02-22 LAB — LIPID PANEL
Cholesterol: 246 mg/dL — ABNORMAL HIGH (ref <200)
HDL: 59 mg/dL (ref >=50)
LDL Cholesterol (Calc): 152 mg/dL (calc) — ABNORMAL HIGH
Non-HDL Cholesterol (Calc): 187 mg/dL (calc) — ABNORMAL HIGH (ref <130)
Total CHOL/HDL Ratio: 4.2 (calc) (ref <5.0)
Triglycerides: 212 mg/dL — ABNORMAL HIGH (ref <150)

## 2020-02-23 ENCOUNTER — Encounter: Payer: Self-pay | Admitting: Family Medicine

## 2020-02-23 NOTE — Assessment & Plan Note (Signed)
Chronic problem, on Levothyroxine 100mcg daily.  Currently asymptomatic.  Check labs.  Adjust meds prn  

## 2020-02-23 NOTE — Assessment & Plan Note (Signed)
Chronic problem.  Has been attempting to control w/o medication.  Check labs.  Start meds prn.

## 2020-02-23 NOTE — Assessment & Plan Note (Signed)
BP is well controlled today.  Currently asymptomatic.  Had 1 day when her BP was elevated but she admits she was upset and overheated.  She doesn't have a home cuff but is planning to get one.  I told her not to obsess about her BP reading and she only needs to check ~3x/week to establish a baseline.  Will continue to follow and treat if needed.  Pt expressed understanding and is in agreement w/ plan.

## 2020-05-12 ENCOUNTER — Other Ambulatory Visit: Payer: Self-pay | Admitting: Family Medicine

## 2020-05-12 DIAGNOSIS — E039 Hypothyroidism, unspecified: Secondary | ICD-10-CM

## 2020-05-12 DIAGNOSIS — E871 Hypo-osmolality and hyponatremia: Secondary | ICD-10-CM

## 2020-05-19 DIAGNOSIS — H401132 Primary open-angle glaucoma, bilateral, moderate stage: Secondary | ICD-10-CM | POA: Diagnosis not present

## 2020-05-19 DIAGNOSIS — H2513 Age-related nuclear cataract, bilateral: Secondary | ICD-10-CM | POA: Diagnosis not present

## 2020-05-19 DIAGNOSIS — H43813 Vitreous degeneration, bilateral: Secondary | ICD-10-CM | POA: Diagnosis not present

## 2020-06-16 ENCOUNTER — Encounter: Payer: Self-pay | Admitting: Family Medicine

## 2020-06-18 NOTE — Telephone Encounter (Signed)
Patient is calling in wanting to check in on this.

## 2020-06-19 ENCOUNTER — Other Ambulatory Visit: Payer: Self-pay

## 2020-06-19 DIAGNOSIS — Z0182 Encounter for allergy testing: Secondary | ICD-10-CM

## 2020-07-15 ENCOUNTER — Encounter: Payer: Self-pay | Admitting: *Deleted

## 2020-08-11 ENCOUNTER — Other Ambulatory Visit: Payer: Self-pay

## 2020-08-11 ENCOUNTER — Encounter: Payer: Self-pay | Admitting: Allergy

## 2020-08-11 ENCOUNTER — Other Ambulatory Visit: Payer: Self-pay | Admitting: Family Medicine

## 2020-08-11 ENCOUNTER — Ambulatory Visit (INDEPENDENT_AMBULATORY_CARE_PROVIDER_SITE_OTHER): Payer: Medicare Other | Admitting: Allergy

## 2020-08-11 VITALS — BP 140/86 | HR 84 | Temp 98.6°F | Resp 18 | Ht 64.5 in | Wt 149.8 lb

## 2020-08-11 DIAGNOSIS — Z7185 Encounter for immunization safety counseling: Secondary | ICD-10-CM | POA: Diagnosis not present

## 2020-08-11 DIAGNOSIS — E039 Hypothyroidism, unspecified: Secondary | ICD-10-CM

## 2020-08-11 DIAGNOSIS — T50995D Adverse effect of other drugs, medicaments and biological substances, subsequent encounter: Secondary | ICD-10-CM

## 2020-08-11 DIAGNOSIS — L230 Allergic contact dermatitis due to metals: Secondary | ICD-10-CM | POA: Insufficient documentation

## 2020-08-11 DIAGNOSIS — E871 Hypo-osmolality and hyponatremia: Secondary | ICD-10-CM

## 2020-08-11 NOTE — Assessment & Plan Note (Signed)
Breaks out in rash with nickel exposure and apparently had patch test for this in the past.  Will give handout on nickel avoidance at next visit.

## 2020-08-11 NOTE — Assessment & Plan Note (Signed)
Patient had large localized reaction after pneumovax vaccine and flu vaccine. Tolerated prevnar 13 and first 2 pfizer Covid-19 vaccinations however now hesitant about getting booster.  Discussed with patient that localized reaction is more of a sign of a robust immune response to the vaccine rather than an allergic reaction without any other IgE-mediated associated symptoms.   Patient interested in getting Covid-19 booster in the office and will schedule 1+ week after amoxicillin challenge is done. Will monitor for 30 minutes after the vaccine.

## 2020-08-11 NOTE — Assessment & Plan Note (Signed)
Rash as a child after taking penicillin. Not sure of specifics of the reaction. Most recently broke out in rash after taking clindamycin for a tooth infection. Tolerates zpak, keflex, doxycycline.  Skin prick and intradermal testing today to penicillin-G, PrePen were negative.  Schedule drug challenge to amoxicillin next.   Continue to avoid clindamycin for now - consider in office drug challenge in the future for clindamycin.

## 2020-08-11 NOTE — Progress Notes (Addendum)
New Patient Note  RE: [name] MRN: [id number] DOB: [birth year] Date of Office Visit: 08/11/2020  Consult requested by: Midge Minium, MD Primary care provider: Midge Minium, MD  Chief Complaint: Allergic Reaction (Reaction after taking Clindamycin, had a rash all over neck and chester.) and Rash  History of Present Illness: I had the pleasure of seeing [name] for initial evaluation at the [facility] on 08/11/2020. She is a 70 y.o. female, who is referred here by Midge Minium, MD for the evaluation of penicillin allergy.  Drug allergy: Patient broke out in a rash as a child after she had penicillin per her mother. Patient herself can't recall specifics and her mother is no longer alive to ask specifics.  She is not sure what exactly happened and what type of infection she was being treated for. She has not take any type of penicillin type of antibiotics since then and not sure if she had any before.  Patient wonders if her mother was being over cautious regarding the penicillin allergy as apparently one of her father's (who was a physician) patient's died after a reaction to penicillin.   Recently she was seen by a dentist and was given clindamycin for a tooth infection. She took clindamycin and on the fourth day patient noted some blotchy redness of the chest and neck. Denies any itching or other associated symptoms. She is not sure if she took clindamycin before but stopped after the fourth day.   Rash resolved after a week and patient did not take any medications for this. Denies any other changes in diet, personal care products.   Patient had zpak, keflex, doxycycline with no issues.   She also mentions getting large localized reaction after the pneumovax vaccine but had no issues with prevnar 13. The past year she also had large localized reaction to the flu vaccine which made her hesitant in getting her pfizer booster  vaccine. No reactions with the initial 2 doses.  Assessment and Plan: [name] is a 70 y.o. female with: Adverse effect of other drugs, medicaments and biological substances, subsequent encounter Rash as a child after taking penicillin. Not sure of specifics of the reaction. Most recently broke out in rash after taking clindamycin for a tooth infection. Tolerates zpak, keflex, doxycycline. Skin prick and intradermal testing today to penicillin-G, PrePen were negative. Schedule drug challenge to amoxicillin next.  Continue to avoid clindamycin for now - consider in office drug challenge in the future for clindamycin.   Vaccine counseling Patient had large localized reaction after pneumovax vaccine and flu vaccine. Tolerated prevnar 13 and first 2 pfizer Covid-19 vaccinations however now hesitant about getting booster. Discussed with patient that localized reaction is more of a sign of a robust immune response to the vaccine rather than an allergic reaction without any other IgE-mediated associated symptoms.  Patient interested in getting Covid-19 booster in the office and will schedule 1+ week after amoxicillin challenge is done. Will monitor for 30 minutes after the vaccine.   Contact dermatitis due to nickel Breaks out in rash with nickel exposure and apparently had patch test for this in the past. Will give handout on nickel avoidance at next visit.  Return for Drug challenge.  No orders of the defined types were placed in this encounter.  Lab Orders  No laboratory test(s) ordered today    Other allergy screening: Asthma: no Rhino conjunctivitis: no Food allergy: no Hymenoptera allergy: no Urticaria: no Eczema:no  Patient has a nickel allergy - rash on the face most likely due to sunglasses.  She describes a positive nickel patch test.   History of recurrent infections suggestive of immunodeficency: no  Diagnostics: Skin Testing:  Skin prick and intradermal testing today to  penicillin-G, PrePen were negative. Results discussed with patient/family.   Penicillin - 08/11/20 1556     Manufacturer [employer]    Lot # [id number]    Location Arm    Number of allergen test 8    Time Testing Placed 0230    Control SPT Negative    Histamine SPT 2+    Pre-Pen Puncture Negative    Penicillin-G 5000 u/ml SPT Negative    Select Select    Time Testing Placed 0254    Penicillin-G 500u/ml Intradermal Negative    Time Testing Placed 0320    Penicillin-G 5000 u/ml Intradermal Negative             Past Medical History: Patient Active Problem List   Diagnosis Date Noted   Adverse effect of other drugs, medicaments and biological substances, subsequent encounter 08/11/2020   Vaccine counseling 08/11/2020   Contact dermatitis due to nickel 08/11/2020   Gout attack 09/19/2019   Arthritis of finger of right hand 06/28/2019   DOE (dyspnea on exertion) 11/29/2017   Postoperative anemia due to acute blood loss 08/22/2013   OA (osteoarthritis) of hip 08/21/2013   Family history of colonic polyps 11/08/2010   Hypothyroidism 06/29/2007   Hyperlipidemia LDL goal <130 06/29/2007   Essential hypertension 06/29/2007   Osteopenia 06/29/2007   Past Medical History:  Diagnosis Date   Anxiety    Cancer ([place])    basal skin cancer    DJD (degenerative joint disease) of hip    right - [place]   Dyslipidemia    GERD (gastroesophageal reflux disease)    Glaucoma (increased eye pressure)    Hypertension    Hypothyroidism    Osteopenia    Pneumonia    hx of pneumonia - lst time 1998   PONV (postoperative nausea and vomiting)    Vitreous floaters of left eye 04/2013   Past Surgical History: Past Surgical History:  Procedure Laterality Date   [place]   for pneumonia recurrent   TONSILLECTOMY AND ADENOIDECTOMY  child age 72   [place] Right 08/21/2013   Procedure: RIGHT TOTAL HIP ARTHROPLASTY ANTERIOR APPROACH;  Surgeon: Gearlean Alf, MD;   Location: [facility];  Service: Orthopedics;  Laterality: Right;   Medication List:  Current Outpatient Medications  Medication Sig Dispense Refill   aspirin 81 MG tablet Take 81 mg by mouth daily.     furosemide (LASIX) 20 MG tablet TAKE 1 TABLET(20 MG) BY MOUTH DAILY 90 tablet 0   levothyroxine (SYNTHROID) 100 MCG tablet TAKE 1 TABLET(100 MCG) BY MOUTH DAILY 90 tablet 0   loperamide (IMODIUM A-D) 2 MG tablet Take 2 mg by mouth as needed for diarrhea or loose stools.     Multiple Vitamins-Minerals (AIRBORNE PO) Take by mouth.     Omega-3 Fatty Acids (FISH OIL) 1000 MG CAPS Take by mouth every other day.     Probiotic CAPS Take 1 capsule by mouth daily.     timolol (TIMOPTIC) 0.5 % ophthalmic solution 1 drop every morning.     Travoprost, BAK Free, (TRAVATAN) 0.004 % SOLN ophthalmic solution Place 1 drop into both eyes at bedtime.     No current facility-administered medications for this visit.  Allergies: Allergies  Allergen Reactions   Penicillins     unknown   Pneumovax 23 [Pneumococcal Vac Polyvalent] Other (See Comments)    Severe localized reaction w/ flu like symptoms   Nickel Rash   Social History: Social History   Socioeconomic History   Marital status: Single    Spouse name: Not on file   Number of children: 0   Years of education: Not on file   Highest education level: Not on file  Occupational History   Occupation: LAWYER    Employer: [employer]  Tobacco Use   Smoking status: Former    Packs/day: 0.50    Years: 20.00    Pack years: 10.00    Types: Cigarettes    Quit date: 01/17/2001    Years since quitting: 19.5   Smokeless tobacco: Never  Vaping Use   Vaping Use: Never used  Substance and Sexual Activity   Alcohol use: Yes    Alcohol/week: 5.0 standard drinks    Types: 5 Glasses of wine per week    Comment: wine   Drug use: No   Sexual activity: Not on file  Other Topics Concern   Not on file  Social History Narrative   Works in front of  computer   Father anesthesiologist         Social Determinants of Health   Financial Resource Strain: Low Risk    Difficulty of Paying Living Expenses: Not hard at all  Food Insecurity: No Food Insecurity   Worried About Charity fundraiser in the Last Year: Never true   Arboriculturist in the Last Year: Never true  Transportation Needs: No Transportation Needs   Lack of Transportation (Medical): No   Lack of Transportation (Non-Medical): No  Physical Activity: Insufficiently Active   Days of Exercise per Week: 6 days   Minutes of Exercise per Session: 10 min  Stress: No Stress Concern Present   Feeling of Stress : Not at all  Social Connections: Moderately Integrated   Frequency of Communication with Friends and Family: More than three times a week   Frequency of Social Gatherings with Friends and Family: More than three times a week   Attends Religious Services: More than 4 times per year   Active Member of [employer] or Organizations: Yes   Attends Music therapist: More than 4 times per year   Marital Status: Never married   Lives in a house built in 1995. Smoking: quit in 2003 Occupation: retired  Programme researcher, broadcasting/film/video History: Environmental education officer in the house: no Charity fundraiser in the family room: no Carpet in the bedroom: no Heating: electric Cooling: central Pet: yes 1 cat  Family History: Family History  Problem Relation Age of Onset   Allergies Mother    Lymphoma Mother    Other Father        IHD   Asthma Brother    Colon polyps Brother    Pulmonary fibrosis Brother 63       ideopathic   Asthma Maternal Grandmother    Colon cancer Neg Hx    Esophageal cancer Neg Hx    Stomach cancer Neg Hx    Rectal cancer Neg Hx    Breast cancer Neg Hx    Review of Systems  Constitutional:  Negative for appetite change, chills, fever and unexpected weight change.  HENT:  Negative for congestion and rhinorrhea.   Eyes:  Negative for itching.  Respiratory:  Negative for  cough, chest tightness,  shortness of breath and wheezing.   Cardiovascular:  Negative for chest pain.  Gastrointestinal:  Negative for abdominal pain.  Genitourinary:  Negative for difficulty urinating.  Skin:  Positive for rash.  Neurological:  Negative for headaches.   Objective: BP 140/86 (BP Location: Right Arm, Patient Position: Sitting, Cuff Size: Normal)   Pulse 84   Temp 98.6 F (37 C) (Temporal)   Resp 18   Ht 5' 4.5" (1.638 m)   Wt 149 lb 12.8 oz (67.9 kg)   LMP 01/17/2001 (Approximate)   SpO2 95%   BMI 25.32 kg/m  Body mass index is 25.32 kg/m. Physical Exam Vitals and nursing note reviewed.  Constitutional:      Appearance: Normal appearance. She is well-developed.  HENT:     Head: Normocephalic and atraumatic.     Right Ear: Tympanic membrane and external ear normal.     Left Ear: Tympanic membrane and external ear normal.     Nose: Nose normal.     Mouth/Throat:     Mouth: Mucous membranes are moist.     Pharynx: Oropharynx is clear.  Eyes:     Conjunctiva/sclera: Conjunctivae normal.  Cardiovascular:     Rate and Rhythm: Normal rate and regular rhythm.     Heart sounds: Normal heart sounds. No murmur heard.   No friction rub. No gallop.  Pulmonary:     Effort: Pulmonary effort is normal.     Breath sounds: Normal breath sounds. No wheezing, rhonchi or rales.  Musculoskeletal:     Cervical back: Neck supple.  Skin:    General: Skin is warm.     Findings: Rash present.     Comments: Small erythematous patch on right cheek area.  Neurological:     Mental Status: She is alert and oriented to person, place, and time.  Psychiatric:        Behavior: Behavior normal.  The plan was reviewed with the patient/family, and all questions/concerned were addressed.  It was my pleasure to see [name] today and participate in her care. Please feel free to contact me with any questions or concerns.  Sincerely,  Rexene Alberts, DO Allergy & Immunology  [facility] office: [place] office: [phone]

## 2020-08-11 NOTE — Patient Instructions (Addendum)
Skin prick and intradermal testing today to penicillin-G, PrePen were negative.  Schedule drug challenge to amoxicillin. You must be off antihistamines for 3-5 days before. Must be in good health and not ill. Plan on being in the office for 2-3 hours and must bring in the drug you want to do the oral challenge for - will send in prescription to pick up a few days before. You must call to schedule an appointment and specify it's for a drug challenge.   Continue to avoid clindamycin for now.  It seems like you had a robust response to the pneumonia shot.  After the amoxicillin challenge, you can schedule a covid-19 booster vaccine for a different day in our office if interested.

## 2020-08-14 ENCOUNTER — Other Ambulatory Visit: Payer: Self-pay | Admitting: Allergy

## 2020-08-14 MED ORDER — AMOXICILLIN 250 MG/5ML PO SUSR: ORAL | 0 refills | Status: DC

## 2020-08-17 NOTE — Progress Notes (Signed)
Follow Up Note  RE: [name] MRN: [id number] DOB: 1950-11-09 Date of Office Visit: 08/18/2020  Referring provider: Midge Minium, MD Primary care provider: Midge Minium, MD  Chief Complaint: Food/Drug Challenge (Penicillin challenge.)  Assessment and Plan: [name] is a 70 y.o. female with: Adverse effect of other drugs, medicaments and biological substances, subsequent encounter Past history - Rash as a child after taking penicillin. Not sure of specifics of the reaction. Most recently broke out in rash after taking clindamycin for a tooth infection. Tolerates zpak, keflex, doxycycline. 2022 Skin prick and intradermal testing today to penicillin-G, PrePen were negative. Tolerated a total '511mg'$  of amoxicillin in the office in 4 increasing doses with no issues. Patient complained at the end of the 1 hour after the last dose of some itching on her abdominal area - some erythema noted but seemed to be due to her scratching. Otherwise no complaints. Patient states it's getting better without any intervention. She says she is normally an itchy person. Discussed with patient that I don't think current itch is from the amoxicillin. For next 24 hours monitor for symptoms. For mild symptoms you can take over the counter antihistamines such as Benadryl and monitor symptoms closely. If symptoms worsen or if you have severe symptoms including breathing issues, throat closure, significant swelling, whole body hives, severe diarrhea and vomiting, lightheadedness then seek immediate medical care. I removed the penicillin allergy from allergy list and may take penicillin type of antibiotics in the future if needed.  Continue to avoid clindamycin - consider in office drug challenge in the future for clindamycin.   Vaccine counseling Past history - large localized reaction after pneumovax vaccine and flu vaccine. Tolerated Prevnar 13 and first 2 pfizer Covid-19 vaccinations however now  hesitant about getting booster. Schedule for Covid-19 booster vaccination. Patient would also like to get flu vaccine in our office in the fall.   Contact dermatitis due to nickel Past history - Breaks out in rash with nickel exposure and apparently had patch test for this in the past. Handout on nickel avoidance given.  Return if symptoms worsen or fail to improve.  Plan: Challenge drug: amoxicillin Challenge as per protocol: Passed Total time: 125 min  History of Present Illness: I had the pleasure of seeing [name] for a follow up visit at the [facility] on 08/18/2020. She is a 70 y.o. female, who is being followed for adverse drug reactions. Her previous allergy office visit was on 08/11/2020 with Dr. Maudie Mercury. Today she is here for amoxicillin drug challenge.   History of Reaction: Rash as a child after taking penicillin. Not sure of specifics of the reaction. Most recently broke out in rash after taking clindamycin for a tooth infection. Tolerates zpak, keflex, doxycycline. 08/11/2020 Skin prick and intradermal testing today to penicillin-G, PrePen were negative.  Interval History: Patient has not been ill, she has not had any accidental exposures to the culprit medication.   Recent/Current History: Pulmonary disease: no Cardiac disease: no Respiratory infection: no Rash: no Itch: no Swelling: no Cough: no Shortness of breath: no Runny/stuffy nose: yes at baseline.  Itchy eyes: no Beta-blocker use: no  Patient/guardian was informed of the test procedure with verbalized understanding of the risk of anaphylaxis. Consent was signed.   Last antihistamine use: none in the past 3 days Last beta-blocker use: n/a  Medication List:  Current Outpatient Medications  Medication Sig Dispense Refill   aspirin 81 MG tablet Take 81  mg by mouth daily.     furosemide (LASIX) 20 MG tablet TAKE 1 TABLET(20 MG) BY MOUTH DAILY 90 tablet 0   ibuprofen (ADVIL) 800 MG  tablet Take 800 mg by mouth every 6 (six) hours as needed.     levothyroxine (SYNTHROID) 100 MCG tablet TAKE 1 TABLET(100 MCG) BY MOUTH DAILY 90 tablet 0   loperamide (IMODIUM A-D) 2 MG tablet Take 2 mg by mouth as needed for diarrhea or loose stools.     Multiple Vitamins-Minerals (AIRBORNE PO) Take by mouth.     Omega-3 Fatty Acids (FISH OIL) 1000 MG CAPS Take by mouth every other day.     Probiotic CAPS Take 1 capsule by mouth daily.     timolol (TIMOPTIC) 0.5 % ophthalmic solution 1 drop every morning.     Travoprost, BAK Free, (TRAVATAN) 0.004 % SOLN ophthalmic solution Place 1 drop into both eyes at bedtime.     No current facility-administered medications for this visit.   Allergies: Allergies  Allergen Reactions   Pneumovax 23 [Pneumococcal Vac Polyvalent] Other (See Comments)    Severe localized reaction w/ flu like symptoms   Nickel Rash   I reviewed her past medical history, social history, family history, and environmental history and no significant changes have been reported from her previous visit.  Review of Systems  Constitutional:  Negative for appetite change, chills, fever and unexpected weight change.  HENT:  Negative for congestion and rhinorrhea.   Eyes:  Negative for itching.  Respiratory:  Negative for cough, chest tightness, shortness of breath and wheezing.   Cardiovascular:  Negative for chest pain.  Gastrointestinal:  Negative for abdominal pain.  Genitourinary:  Negative for difficulty urinating.  Skin:  Negative for rash.  Neurological:  Negative for headaches.   Objective: BP 122/78 (BP Location: Left Arm, Patient Position: Sitting, Cuff Size: Normal)   Pulse 71   Temp 98.6 F (37 C) (Temporal)   Resp 18   Wt 151 lb 12.8 oz (68.9 kg)   LMP 01/17/2001 (Approximate)   SpO2 96%   BMI 25.65 kg/m  Body mass index is 25.65 kg/m. Physical Exam Vitals and nursing note reviewed.  Constitutional:      Appearance: Normal appearance. She is  well-developed.  HENT:     Head: Normocephalic and atraumatic.     Right Ear: Tympanic membrane and external ear normal.     Left Ear: Tympanic membrane and external ear normal.     Nose: Nose normal.     Mouth/Throat:     Mouth: Mucous membranes are moist.     Pharynx: Oropharynx is clear.  Eyes:     Conjunctiva/sclera: Conjunctivae normal.  Cardiovascular:     Rate and Rhythm: Normal rate and regular rhythm.     Heart sounds: Normal heart sounds. No murmur heard.   No friction rub. No gallop.  Pulmonary:     Effort: Pulmonary effort is normal.     Breath sounds: Normal breath sounds. No wheezing, rhonchi or rales.  Musculoskeletal:     Cervical back: Neck supple.  Skin:    General: Skin is warm.     Findings: No rash.  Neurological:     Mental Status: She is alert and oriented to person, place, and time.  Psychiatric:        Behavior: Behavior normal.    Diagnostics: Results discussed with patient/family.  Oral Challenge - 08/18/20 0900     Challenge Food/Drug Amoxicillin    Lot #  if  Applicable [id number]    Food/Drug provided by Patient    BP 122/78    Pulse 71    Respirations 18    Lungs clear    Skin clear    Mouth clear    Time 0907    Dose Diluted 0.13m    Time 0922    Dose Diluted 2103m   Time 0941    Dose 41m104m  Time 0959    Dose 8mL84m BP 142/82    Pulse 72    Respirations 20             Previous notes and tests were reviewed. The plan was reviewed with the patient/family, and all questions/concerned were addressed.  It was my pleasure to see JeanTheclaay and participate in her care. Please feel free to contact me with any questions or concerns.  Sincerely,  [facility]  [facility]: [phone]: [phone]

## 2020-08-18 ENCOUNTER — Encounter: Payer: Self-pay | Admitting: Allergy

## 2020-08-18 ENCOUNTER — Ambulatory Visit (INDEPENDENT_AMBULATORY_CARE_PROVIDER_SITE_OTHER): Payer: Medicare Other | Admitting: Allergy

## 2020-08-18 ENCOUNTER — Other Ambulatory Visit: Payer: Self-pay

## 2020-08-18 VITALS — BP 122/78 | HR 71 | Temp 98.6°F | Resp 18 | Wt 151.8 lb

## 2020-08-18 DIAGNOSIS — Z7185 Encounter for immunization safety counseling: Secondary | ICD-10-CM

## 2020-08-18 DIAGNOSIS — T50995D Adverse effect of other drugs, medicaments and biological substances, subsequent encounter: Secondary | ICD-10-CM | POA: Diagnosis not present

## 2020-08-18 DIAGNOSIS — L230 Allergic contact dermatitis due to metals: Secondary | ICD-10-CM | POA: Diagnosis not present

## 2020-08-18 NOTE — Assessment & Plan Note (Addendum)
Past history - Rash as a child after taking penicillin. Not sure of specifics of the reaction. Most recently broke out in rash after taking clindamycin for a tooth infection. Tolerates zpak, keflex, doxycycline. 2022 Skin prick and intradermal testing today to penicillin-G, PrePen were negative.  Tolerated a total '511mg'$  of amoxicillin in the office in 4 increasing doses with no issues.  Patient complained at the end of the 1 hour after the last dose of some itching on her abdominal area - some erythema noted but seemed to be due to her scratching. Otherwise no complaints. Patient states it's getting better without any intervention. She says she is normally an itchy person.  Discussed with patient that I don't think current itch is from the amoxicillin.  For next 24 hours monitor for symptoms. For mild symptoms you can take over the counter antihistamines such as Benadryl and monitor symptoms closely. If symptoms worsen or if you have severe symptoms including breathing issues, throat closure, significant swelling, whole body hives, severe diarrhea and vomiting, lightheadedness then seek immediate medical care.  I removed the penicillin allergy from allergy list and may take penicillin type of antibiotics in the future if needed.   Continue to avoid clindamycin - consider in office drug challenge in the future for clindamycin.

## 2020-08-18 NOTE — Assessment & Plan Note (Signed)
Past history - large localized reaction after pneumovax vaccine and flu vaccine. Tolerated Prevnar 13 and first 2 pfizer Covid-19 vaccinations however now hesitant about getting booster.  Schedule for Covid-19 booster vaccination.  Patient would also like to get flu vaccine in our office in the fall.

## 2020-08-18 NOTE — Assessment & Plan Note (Signed)
Past history - Breaks out in rash with nickel exposure and apparently had patch test for this in the past.  Handout on nickel avoidance given.

## 2020-08-18 NOTE — Addendum Note (Signed)
Addended by: Marcos Eke on: 08/18/2020 04:42 PM   Modules accepted: Orders

## 2020-08-18 NOTE — Patient Instructions (Addendum)
For next 24 hours monitor for symptoms. For mild symptoms you can take over the counter antihistamines such as Benadryl and monitor symptoms closely. If symptoms worsen or if you have severe symptoms including breathing issues, throat closure, significant swelling, whole body hives, severe diarrhea and vomiting, lightheadedness then seek immediate medical care.  Will remove penicillin from your allergy list.   If interested, you can schedule a covid-19 booster vaccine in our office.   Contact dermatitis due to nickel See handout.  Follow up as needed.

## 2020-08-19 ENCOUNTER — Telehealth: Payer: Self-pay | Admitting: Allergy

## 2020-08-19 NOTE — Telephone Encounter (Signed)
Please call patient and see how she did after the amoxicillin challenge yesterday.  Thank you.

## 2020-08-19 NOTE — Telephone Encounter (Signed)
-----   Message from Garnet Sierras, DO sent at 08/18/2020  1:25 PM EDT ----- Regarding: call paitent to follow up

## 2020-08-19 NOTE — Telephone Encounter (Signed)
Left message for patient to call the office with an update.

## 2020-08-21 NOTE — Telephone Encounter (Signed)
Patient returned call from [name]., stated she felt fine and did not have any symptoms following challenge.

## 2020-08-25 ENCOUNTER — Ambulatory Visit (INDEPENDENT_AMBULATORY_CARE_PROVIDER_SITE_OTHER): Payer: Medicare Other

## 2020-08-25 ENCOUNTER — Other Ambulatory Visit: Payer: Self-pay

## 2020-08-25 DIAGNOSIS — Z23 Encounter for immunization: Secondary | ICD-10-CM

## 2020-09-23 DIAGNOSIS — Z20822 Contact with and (suspected) exposure to covid-19: Secondary | ICD-10-CM | POA: Diagnosis not present

## 2020-11-02 ENCOUNTER — Ambulatory Visit (INDEPENDENT_AMBULATORY_CARE_PROVIDER_SITE_OTHER): Payer: Medicare Other | Admitting: *Deleted

## 2020-11-02 DIAGNOSIS — Z780 Asymptomatic menopausal state: Secondary | ICD-10-CM | POA: Diagnosis not present

## 2020-11-02 DIAGNOSIS — Z1231 Encounter for screening mammogram for malignant neoplasm of breast: Secondary | ICD-10-CM

## 2020-11-02 DIAGNOSIS — Z0000 Encounter for general adult medical examination without abnormal findings: Secondary | ICD-10-CM | POA: Diagnosis not present

## 2020-11-02 NOTE — Patient Instructions (Signed)
Ms. [name] , Thank you for taking time to come for your Medicare Wellness Visit. I appreciate your ongoing commitment to your health goals. Please review the following plan we discussed and let me know if I can assist you in the future.   Screening recommendations/referrals: Colonoscopy: up to date Mammogram: Education provided Bone Density: Education provided Recommended yearly ophthalmology/optometry visit for glaucoma screening and checkup Recommended yearly dental visit for hygiene and checkup  Vaccinations: Influenza vaccine: Education provided Pneumococcal vaccine: up to date Tdap vaccine: Education provided Shingles vaccine: Education provided    Advanced directives: copy requested  Conditions/risks identified:   Next appointment:    Preventive Care 72 Years and Older, Female Preventive care refers to lifestyle choices and visits with your health care provider that can promote health and wellness. What does preventive care include? A yearly physical exam. This is also called an annual well check. Dental exams once or twice a year. Routine eye exams. Ask your health care provider how often you should have your eyes checked. Personal lifestyle choices, including: Daily care of your teeth and gums. Regular physical activity. Eating a healthy diet. Avoiding tobacco and drug use. Limiting alcohol use. Practicing safe sex. Taking low-dose aspirin every day. Taking vitamin and mineral supplements as recommended by your health care provider. What happens during an annual well check? The services and screenings done by your health care provider during your annual well check will depend on your age, overall health, lifestyle risk factors, and family history of disease. Counseling  Your health care provider may ask you questions about your: Alcohol use. Tobacco use. Drug use. Emotional well-being. Home and relationship well-being. Sexual activity. Eating habits. History  of falls. Memory and ability to understand (cognition). Work and work Statistician. Reproductive health. Screening  You may have the following tests or measurements: Height, weight, and BMI. Blood pressure. Lipid and cholesterol levels. These may be checked every 5 years, or more frequently if you are over 36 years old. Skin check. Lung cancer screening. You may have this screening every year starting at age 72 if you have a 30-pack-year history of smoking and currently smoke or have quit within the past 15 years. Fecal occult blood test (FOBT) of the stool. You may have this test every year starting at age 72. Flexible sigmoidoscopy or colonoscopy. You may have a sigmoidoscopy every 5 years or a colonoscopy every 10 years starting at age 72. Hepatitis C blood test. Hepatitis B blood test. Sexually transmitted disease (STD) testing. Diabetes screening. This is done by checking your blood sugar (glucose) after you have not eaten for a while (fasting). You may have this done every 1-3 years. Bone density scan. This is done to screen for osteoporosis. You may have this done starting at age 72. Mammogram. This may be done every 1-2 years. Talk to your health care provider about how often you should have regular mammograms. Talk with your health care provider about your test results, treatment options, and if necessary, the need for more tests. Vaccines  Your health care provider may recommend certain vaccines, such as: Influenza vaccine. This is recommended every year. Tetanus, diphtheria, and acellular pertussis (Tdap, Td) vaccine. You may need a Td booster every 10 years. Zoster vaccine. You may need this after age 72. Pneumococcal 13-valent conjugate (PCV13) vaccine. One dose is recommended after age 72. Pneumococcal polysaccharide (PPSV23) vaccine. One dose is recommended after age 72. Talk to your health care provider about which screenings and vaccines you need  and how often you need  them. This information is not intended to replace advice given to you by your health care provider. Make sure you discuss any questions you have with your health care provider. Document Released: 01/30/2015 Document Revised: 09/23/2015 Document Reviewed: 11/04/2014 Elsevier Interactive Patient Education  2017 [place] Prevention in the Home Falls can cause injuries. They can happen to people of all ages. There are many things you can do to make your home safe and to help prevent falls. What can I do on the outside of my home? Regularly fix the edges of walkways and driveways and fix any cracks. Remove anything that might make you trip as you walk through a door, such as a raised step or threshold. Trim any bushes or trees on the path to your home. Use bright outdoor lighting. Clear any walking paths of anything that might make someone trip, such as rocks or tools. Regularly check to see if handrails are loose or broken. Make sure that both sides of any steps have handrails. Any raised decks and porches should have guardrails on the edges. Have any leaves, snow, or ice cleared regularly. Use sand or salt on walking paths during winter. Clean up any spills in your garage right away. This includes oil or grease spills. What can I do in the bathroom? Use night lights. Install grab bars by the toilet and in the tub and shower. Do not use towel bars as grab bars. Use non-skid mats or decals in the tub or shower. If you need to sit down in the shower, use a plastic, non-slip stool. Keep the floor dry. Clean up any water that spills on the floor as soon as it happens. Remove soap buildup in the tub or shower regularly. Attach bath mats securely with double-sided non-slip rug tape. Do not have throw rugs and other things on the floor that can make you trip. What can I do in the bedroom? Use night lights. Make sure that you have a light by your bed that is easy to reach. Do not use  any sheets or blankets that are too big for your bed. They should not hang down onto the floor. Have a firm chair that has side arms. You can use this for support while you get dressed. Do not have throw rugs and other things on the floor that can make you trip. What can I do in the kitchen? Clean up any spills right away. Avoid walking on wet floors. Keep items that you use a lot in easy-to-reach places. If you need to reach something above you, use a strong step stool that has a grab bar. Keep electrical cords out of the way. Do not use floor polish or wax that makes floors slippery. If you must use wax, use non-skid floor wax. Do not have throw rugs and other things on the floor that can make you trip. What can I do with my stairs? Do not leave any items on the stairs. Make sure that there are handrails on both sides of the stairs and use them. Fix handrails that are broken or loose. Make sure that handrails are as long as the stairways. Check any carpeting to make sure that it is firmly attached to the stairs. Fix any carpet that is loose or worn. Avoid having throw rugs at the top or bottom of the stairs. If you do have throw rugs, attach them to the floor with carpet tape. Make sure that you  have a light switch at the top of the stairs and the bottom of the stairs. If you do not have them, ask someone to add them for you. What else can I do to help prevent falls? Wear shoes that: Do not have high heels. Have rubber bottoms. Are comfortable and fit you well. Are closed at the toe. Do not wear sandals. If you use a stepladder: Make sure that it is fully opened. Do not climb a closed stepladder. Make sure that both sides of the stepladder are locked into place. Ask someone to hold it for you, if possible. Clearly mark and make sure that you can see: Any grab bars or handrails. First and last steps. Where the edge of each step is. Use tools that help you move around (mobility aids)  if they are needed. These include: Canes. Walkers. Scooters. Crutches. Turn on the lights when you go into a dark area. Replace any light bulbs as soon as they burn out. Set up your furniture so you have a clear path. Avoid moving your furniture around. If any of your floors are uneven, fix them. If there are any pets around you, be aware of where they are. Review your medicines with your doctor. Some medicines can make you feel dizzy. This can increase your chance of falling. Ask your doctor what other things that you can do to help prevent falls. This information is not intended to replace advice given to you by your health care provider. Make sure you discuss any questions you have with your health care provider. Document Released: 10/30/2008 Document Revised: 06/11/2015 Document Reviewed: 02/07/2014 Elsevier Interactive Patient Education  2017 [employer].

## 2020-11-02 NOTE — Progress Notes (Signed)
Subjective:   [name] is a 70 y.o. female who presents for Medicare Annual (Subsequent) preventive examination.  I connected with  [name] on 11/02/20 by a telephone enabled telemedicine application and verified that I am speaking with the correct person using two identifiers.   I discussed the limitations of evaluation and management by telemedicine. The patient expressed understanding and agreed to proceed.     Review of Systems     Cardiac Risk Factors include: advanced age (>98mn, >>82women);hypertension     Objective:    Today's Vitals   There is no height or weight on file to calculate BMI.  Advanced Directives 11/02/2020 09/03/2019 08/19/2017 08/19/2017 08/24/2013 08/21/2013 08/14/2013  Does Patient Have a Medical Advance Directive? Yes Yes Yes Yes - Patient does not have advance directive Patient does not have advance directive  Type of Advance Directive Healthcare Power of [place] Living will;Healthcare Power of [place] - - -  [place] of [place]? No - copy requested No - copy requested No - copy requested No - copy requested - - -  Pre-existing out of facility DNR order (yellow form or pink MOST form) - - - - No No No    Current Medications (verified) Outpatient Encounter Medications as of 11/02/2020  Medication Sig   aspirin 81 MG tablet Take 81 mg by mouth daily.   calcium carbonate (TUMS - DOSED IN MG ELEMENTAL CALCIUM) 500 MG chewable tablet Chew 1 tablet by mouth daily. As needed   cholecalciferol (VITAMIN D3) 25 MCG (1000 UNIT) tablet Take 1,000 Units by mouth daily. 2 x daily   fluticasone (FLONASE) 50 MCG/ACT nasal spray Place into both nostrils daily.   furosemide (LASIX) 20 MG tablet TAKE 1 TABLET(20 MG) BY MOUTH DAILY   levothyroxine (SYNTHROID) 100 MCG tablet TAKE 1 TABLET(100 MCG) BY MOUTH DAILY   Omega-3 Fatty Acids (FISH OIL) 1000 MG  CAPS Take by mouth every other day.   Probiotic CAPS Take 1 capsule by mouth daily.   timolol (TIMOPTIC) 0.5 % ophthalmic solution 1 drop every morning.   Travoprost, BAK Free, (TRAVATAN) 0.004 % SOLN ophthalmic solution Place 1 drop into both eyes at bedtime.   ibuprofen (ADVIL) 800 MG tablet Take 800 mg by mouth every 6 (six) hours as needed.   loperamide (IMODIUM A-D) 2 MG tablet Take 2 mg by mouth as needed for diarrhea or loose stools. (Patient not taking: Reported on 11/02/2020)   Multiple Vitamins-Minerals (AIRBORNE PO) Take by mouth. (Patient not taking: Reported on 11/02/2020)   No facility-administered encounter medications on file as of 11/02/2020.    Allergies (verified) Pneumovax 23 [pneumococcal vac polyvalent] and Nickel   History: Past Medical History:  Diagnosis Date   Anxiety    [place]    basal skin cancer    DJD (degenerative joint disease) of hip    right - Alusio   Dyslipidemia    GERD (gastroesophageal reflux disease)    Glaucoma (increased eye pressure)    Hypertension    Hypothyroidism    Osteopenia    Pneumonia    hx of pneumonia - lst time 1998   PONV (postoperative nausea and vomiting)    Vitreous floaters of left eye 04/2013   Past Surgical History:  Procedure Laterality Date   FStartex  for pneumonia recurrent   TONSILLECTOMY AND ADENOIDECTOMY  child age 70  [place]  ARTHROPLASTY Right 08/21/2013   Procedure: RIGHT TOTAL HIP ARTHROPLASTY ANTERIOR APPROACH;  Surgeon: Gearlean Alf, MD;  Location: [facility];  Service: Orthopedics;  Laterality: Right;   Family History  Problem Relation Age of Onset   Allergies Mother    Lymphoma Mother    Other Father        IHD   Asthma Brother    Colon polyps Brother    Pulmonary fibrosis Brother 25       ideopathic   Asthma Maternal Grandmother    Colon cancer Neg Hx    Esophageal cancer Neg Hx    Stomach cancer Neg Hx    Rectal cancer Neg Hx    Breast cancer Neg Hx     Social History   Socioeconomic History   Marital status: Single    Spouse name: Not on file   Number of children: 0   Years of education: Not on file   Highest education level: Not on file  Occupational History   Occupation: LAWYER    Employer: [employer]  Tobacco Use   Smoking status: Former    Packs/day: 0.50    Years: 20.00    Pack years: 10.00    Types: Cigarettes    Quit date: 01/17/2001    Years since quitting: 19.8   Smokeless tobacco: Never  Vaping Use   Vaping Use: Never used  Substance and Sexual Activity   Alcohol use: Yes    Alcohol/week: 5.0 standard drinks    Types: 5 Glasses of wine per week    Comment: wine   Drug use: No   Sexual activity: Not Currently  Other Topics Concern   Not on file  Social History Narrative   Works in front of computer   Father anesthesiologist         Social Determinants of Health   Financial Resource Strain: Low Risk    Difficulty of Paying Living Expenses: Not very hard  Food Insecurity: No Food Insecurity   Worried About Charity fundraiser in the Last Year: Never true   Ran Out of Food in the Last Year: Never true  Transportation Needs: No Transportation Needs   Lack of Transportation (Medical): No   Lack of Transportation (Non-Medical): No  Physical Activity: Unknown   Days of Exercise per Week: 3 days   Minutes of Exercise per Session: Not on file  Stress: No Stress Concern Present   Feeling of Stress : Only a little  Social Connections: Unknown   Frequency of Communication with Friends and Family: More than three times a week   Frequency of Social Gatherings with Friends and Family: Three times a week   Attends Religious Services: 1 to 4 times per year   Active Member of Clubs or Organizations: No   Attends Music therapist: Never   Marital Status: Not on file    Tobacco Counseling Counseling given: Not Answered   Clinical Intake:  Pre-visit preparation completed: Yes  Pain :  No/denies pain     Nutritional Risks: None Diabetes: No  How often do you need to have someone help you when you read instructions, pamphlets, or other written materials from your doctor or pharmacy?: 1 - Never  Diabetic?no  Interpreter Needed?: No  Information entered by :: Leroy Kennedy LPN   Activities of Daily Living In your present state of health, do you have any difficulty performing the following activities: 11/02/2020 02/21/2020  Hearing? Y N  Vision? Aggie Moats  Comment - -  Difficulty concentrating or making decisions? N N  Walking or climbing stairs? N N  Dressing or bathing? N N  Doing errands, shopping? N N  Preparing Food and eating ? N -  Using the Toilet? N -  In the past six months, have you accidently leaked urine? N -  Do you have problems with loss of bowel control? N -  Managing your Medications? N -  Managing your Finances? N -  Housekeeping or managing your Housekeeping? N -  Some recent data might be hidden    Patient Care Team: Midge Minium, MD as PCP - General (Family Medicine) Gatha Mayer, MD as Consulting Physician (Gastroenterology) [name], MD as Consulting Physician (Pulmonary Disease) Shawnie Dapper, DO as Consulting Physician (Osteopathic Medicine) Madelin Rear, [facility] as Pharmacist (Pharmacist)  Indicate any recent Medical Services you may have received from other than [facility] providers in the past year (date may be approximate).     Assessment:   This is a routine wellness examination for [place].  Hearing/Vision screen Hearing Screening - Comments:: Mild hearing loss   Vision Screening - Comments:: Dr, Bing Plume  Up to date  Dietary issues and exercise activities discussed: Current Exercise Habits: Home exercise routine, Type of exercise: strength training/weights;walking, Time (Minutes): 35, Frequency (Times/Week): 4, Weekly Exercise (Minutes/Week): 140, Intensity: Mild   Goals Addressed             This Visit's  Progress    Weight (lb) < 200 lb (90.7 kg)       Be more active       Depression Screen PHQ 2/9 Scores 11/02/2020 02/21/2020 01/09/2020 09/03/2019 06/20/2019 04/06/2018 01/24/2018  PHQ - 2 Score 0 0 0 1 2 0 0  PHQ- 9 Score - 0 0 - 2 - 0    Fall Risk Fall Risk  11/02/2020 02/21/2020 01/09/2020 09/03/2019 06/20/2019  Falls in the past year? 0 0 0 0 0  Number falls in past yr: 0 0 0 0 0  Injury with Fall? 0 0 0 0 0  Risk for fall due to : - No Fall Risks No Fall Risks - -  Follow up Falls prevention discussed - - Falls prevention discussed Falls evaluation completed    FALL RISK PREVENTION PERTAINING TO THE HOME:  Any stairs in or around the home? No  If so, are there any without handrails? No  Home free of loose throw rugs in walkways, pet beds, electrical cords, etc? Yes  Adequate lighting in your home to reduce risk of falls? Yes   ASSISTIVE DEVICES UTILIZED TO PREVENT FALLS:  Life alert? No  Use of a cane, walker or w/c? No  Grab bars in the bathroom? No  Shower chair or bench in shower? No  Elevated toilet seat or a handicapped toilet? No   TIMED UP AND GO:  Was the test performed? No .    Cognitive Function:   Normal cognitive status assessed by direct observation by this Nurse Health Advisor. No abnormalities found.        Immunizations Immunization History  Administered Date(s) Administered   Fluad Quad(high Dose 65+) 11/05/2018, 11/12/2019   Influenza, High Dose Seasonal PF 10/21/2016, 11/29/2017   PFIZER Comirnaty(Gray Top)Covid-19 Tri-Sucrose Vaccine 08/25/2020   PFIZER(Purple Top)SARS-COV-2 Vaccination 03/14/2019, 04/09/2019   Pneumococcal Conjugate-13 02/12/2014   Td 06/26/2008    TDAP status: Due, Education has been provided regarding the importance of this vaccine. Advised may receive this vaccine at  local pharmacy or Health Dept. Aware to provide a copy of the vaccination record if obtained from local pharmacy or Health Dept. Verbalized acceptance and  understanding.  Flu Vaccine status: Due, Education has been provided regarding the importance of this vaccine. Advised may receive this vaccine at local pharmacy or Health Dept. Aware to provide a copy of the vaccination record if obtained from local pharmacy or Health Dept. Verbalized acceptance and understanding.  Pneumococcal vaccine status: Up to date  Covid-19 vaccine status: Information provided on how to obtain vaccines.   Qualifies for Shingles Vaccine? No   Zostavax completed No   Shingrix Completed?: No.    Education has been provided regarding the importance of this vaccine. Patient has been advised to call insurance company to determine out of pocket expense if they have not yet received this vaccine. Advised may also receive vaccine at local pharmacy or Health Dept. Verbalized acceptance and understanding.  Screening Tests Health Maintenance  Topic Date Due   Hepatitis C Screening  Never done   Zoster Vaccines- Shingrix (1 of 2) Never done   DEXA SCAN  Never done   TETANUS/TDAP  06/27/2018   MAMMOGRAM  12/29/2019   INFLUENZA VACCINE  08/17/2020   COVID-19 Vaccine (4 - Booster for Pfizer series) 12/25/2020   COLONOSCOPY (Pts 45-2yr Insurance coverage will need to be confirmed)  09/22/2027   HPV VACCINES  Aged Out    Health Maintenance  Health Maintenance Due  Topic Date Due   Hepatitis C Screening  Never done   Zoster Vaccines- Shingrix (1 of 2) Never done   DEXA SCAN  Never done   TETANUS/TDAP  06/27/2018   MAMMOGRAM  12/29/2019   INFLUENZA VACCINE  08/17/2020    Colorectal cancer screening: Type of screening: Colonoscopy. Completed 2019. Repeat every 10 years  Mammogram status: Ordered  . Pt provided with contact info and advised to call to schedule appt.   Bone Density status: Ordered  . Pt provided with contact info and advised to call to schedule appt.  Lung Cancer Screening: (Low Dose CT Chest recommended if Age 72-80years, 30 pack-year currently  smoking OR have quit w/in 15years.) does not qualify.   Lung Cancer Screening Referral:   Additional Screening:  Hepatitis C Screening: does qualify; Completed .  Vision Screening: Recommended annual ophthalmology exams for early detection of glaucoma and other disorders of the eye. Is the patient up to date with their annual eye exam?  Yes  Who is the provider or what is the name of the office in which the patient attends annual eye exams? Dr. DBing PlumeIf pt is not established with a provider, would they like to be referred to a provider to establish care? No .   Dental Screening: Recommended annual dental exams for proper oral hygiene  Community Resource Referral / Chronic Care Management: CRR required this visit?  No   CCM required this visit?  No      Plan:     I have personally reviewed and noted the following in the patient's chart:   Medical and social history Use of alcohol, tobacco or illicit drugs  Current medications and supplements including opioid prescriptions.  Functional ability and status Nutritional status Physical activity Advanced directives List of other physicians Hospitalizations, surgeries, and ER visits in previous 12 months Vitals Screenings to include cognitive, depression, and falls Referrals and appointments  In addition, I have reviewed and discussed with patient certain preventive protocols, quality metrics, and best practice recommendations.  A written personalized care plan for preventive services as well as general preventive health recommendations were provided to patient.     Leroy Kennedy, LPN   [id number]   Nurse Notes:

## 2020-11-11 ENCOUNTER — Other Ambulatory Visit: Payer: Self-pay | Admitting: Registered Nurse

## 2020-11-11 DIAGNOSIS — E039 Hypothyroidism, unspecified: Secondary | ICD-10-CM

## 2020-11-11 DIAGNOSIS — E871 Hypo-osmolality and hyponatremia: Secondary | ICD-10-CM

## 2020-11-12 NOTE — Telephone Encounter (Signed)
Will bounce this over your way! Looks like I'd given her a courtesy but hadn't seen her.  Thanks,  Denice Paradise

## 2020-11-20 DIAGNOSIS — H2513 Age-related nuclear cataract, bilateral: Secondary | ICD-10-CM | POA: Diagnosis not present

## 2020-11-20 DIAGNOSIS — H5213 Myopia, bilateral: Secondary | ICD-10-CM | POA: Diagnosis not present

## 2020-11-20 DIAGNOSIS — H1045 Other chronic allergic conjunctivitis: Secondary | ICD-10-CM | POA: Diagnosis not present

## 2020-11-20 DIAGNOSIS — H04123 Dry eye syndrome of bilateral lacrimal glands: Secondary | ICD-10-CM | POA: Diagnosis not present

## 2020-11-20 DIAGNOSIS — H401132 Primary open-angle glaucoma, bilateral, moderate stage: Secondary | ICD-10-CM | POA: Diagnosis not present

## 2020-11-24 ENCOUNTER — Ambulatory Visit (INDEPENDENT_AMBULATORY_CARE_PROVIDER_SITE_OTHER): Payer: Medicare Other

## 2020-11-24 ENCOUNTER — Other Ambulatory Visit: Payer: Self-pay

## 2020-11-24 DIAGNOSIS — Z23 Encounter for immunization: Secondary | ICD-10-CM | POA: Diagnosis not present

## 2020-12-14 ENCOUNTER — Other Ambulatory Visit: Payer: Self-pay | Admitting: Family Medicine

## 2020-12-14 DIAGNOSIS — H66001 Acute suppurative otitis media without spontaneous rupture of ear drum, right ear: Secondary | ICD-10-CM | POA: Diagnosis not present

## 2020-12-14 DIAGNOSIS — E039 Hypothyroidism, unspecified: Secondary | ICD-10-CM

## 2020-12-14 DIAGNOSIS — E871 Hypo-osmolality and hyponatremia: Secondary | ICD-10-CM

## 2020-12-14 DIAGNOSIS — G441 Vascular headache, not elsewhere classified: Secondary | ICD-10-CM | POA: Diagnosis not present

## 2020-12-17 ENCOUNTER — Encounter: Payer: Self-pay | Admitting: Family Medicine

## 2020-12-21 DIAGNOSIS — L270 Generalized skin eruption due to drugs and medicaments taken internally: Secondary | ICD-10-CM | POA: Diagnosis not present

## 2020-12-21 DIAGNOSIS — H6501 Acute serous otitis media, right ear: Secondary | ICD-10-CM | POA: Diagnosis not present

## 2020-12-21 DIAGNOSIS — I10 Essential (primary) hypertension: Secondary | ICD-10-CM | POA: Diagnosis not present

## 2020-12-24 ENCOUNTER — Encounter: Payer: Self-pay | Admitting: Allergy

## 2021-02-12 DIAGNOSIS — B9562 Methicillin resistant Staphylococcus aureus infection as the cause of diseases classified elsewhere: Secondary | ICD-10-CM | POA: Diagnosis not present

## 2021-02-12 DIAGNOSIS — L089 Local infection of the skin and subcutaneous tissue, unspecified: Secondary | ICD-10-CM | POA: Diagnosis not present

## 2021-02-12 DIAGNOSIS — S90451A Superficial foreign body, right great toe, initial encounter: Secondary | ICD-10-CM | POA: Diagnosis not present

## 2021-02-15 ENCOUNTER — Other Ambulatory Visit: Payer: Self-pay | Admitting: Podiatry

## 2021-02-15 ENCOUNTER — Telehealth: Payer: Self-pay | Admitting: *Deleted

## 2021-02-15 MED ORDER — CEPHALEXIN 500 MG PO CAPS: 500.0000 mg | ORAL_CAPSULE | Freq: Four times a day (QID) | ORAL | 0 refills | Status: AC

## 2021-02-15 NOTE — Telephone Encounter (Signed)
Patient called and is having oozing ,drainage from the right great toe.she has an upcoming appointment on 02/17/21. Please advise /schedule for a sooner appointment if available

## 2021-02-17 ENCOUNTER — Ambulatory Visit (INDEPENDENT_AMBULATORY_CARE_PROVIDER_SITE_OTHER): Payer: Medicare Other | Admitting: Podiatry

## 2021-02-17 ENCOUNTER — Other Ambulatory Visit: Payer: Self-pay

## 2021-02-17 ENCOUNTER — Encounter: Payer: Self-pay | Admitting: Podiatry

## 2021-02-17 DIAGNOSIS — L97511 Non-pressure chronic ulcer of other part of right foot limited to breakdown of skin: Secondary | ICD-10-CM | POA: Diagnosis not present

## 2021-02-17 DIAGNOSIS — L309 Dermatitis, unspecified: Secondary | ICD-10-CM

## 2021-02-17 NOTE — Progress Notes (Signed)
Subjective:  Patient ID: [name], female    DOB: 1951-01-15,   MRN: [id number]  Chief Complaint  Patient presents with   Toe Pain    Hallux right - gets warty lesion every January, lused wart meds and think it burnt toe, tip has wound and is red, went to [facility]-Rx'd antibiotics, better, ongoing x couple weeks, worsened with redness and drainage on Friday   New Patient (Initial Visit)    Est pt 2021    71 y.o. female presents for concern of right toe pain. Relates every January for years she gets a warty lesion on her toes and treats it with OTC wart remover. Relates this time she caused a wound to develop. She was seen in urgent care and  called our office and given a prescription for keflex. Relates it is doing much better. States the left foot is now starting to get some of the lesions.   . Denies any other pedal complaints. Denies n/v/f/c.   Past Medical History:  Diagnosis Date   Anxiety    Cancer ([place])    basal skin cancer    DJD (degenerative joint disease) of hip    right - Alusio   Dyslipidemia    GERD (gastroesophageal reflux disease)    Glaucoma (increased eye pressure)    Hypertension    Hypothyroidism    Osteopenia    Pneumonia    hx of pneumonia - lst time 1998   PONV (postoperative nausea and vomiting)    Vitreous floaters of left eye 04/2013    Objective:  Physical Exam: Vascular: DP/PT pulses 2/4 bilateral. CFT <3 seconds. Normal hair growth on digits. No edema.  Skin. No lacerations or abrasions bilateral feet. Right hallux with 0.2 cm wound with dry eschar like scabbing around.  Healing well. No erythema or edema noted. No purulence. Left hallux second and third digits with erythematous vesicle type lesions plantar aspect of toes.  Musculoskeletal: MMT 5/5 bilateral lower extremities in DF, PF, Inversion and Eversion. Deceased ROM in DF of ankle joint.  Neurological: Sensation intact to light touch.   Assessment:   1. Ulcer of right foot  limited to breakdown of skin ([place])   2. Dermatitis of left foot      Plan:  Patient was evaluated and treated and all questions answered. Ulcer right hallux limited to breakdown of skin.  Dermatitis of left foot.  -No debridement necessary  -Dressed with neosporin, DSD. -Continue course of antibiotics.  -Referral to dermatology for skin lesions. Discussed possibility of dermatitis coming from certain shoe that she wears only in January vs vasculitis vs other skin pathology.  -Discussed if any worsening redness, pain, fever or chills to call or may need to report to the emergency room. Patient expressed understanding.  Return in 2 weeks for wound check.    No follow-ups on file.   Lorenda Peck, DPM

## 2021-02-23 ENCOUNTER — Telehealth: Payer: Self-pay | Admitting: Dermatology

## 2021-02-23 NOTE — Telephone Encounter (Signed)
Please ask Dr. Erasmo Downer office to see if they can get her in elsewhere before August

## 2021-02-23 NOTE — Telephone Encounter (Signed)
Notes documented and referral routed back to referring office. 

## 2021-03-02 ENCOUNTER — Ambulatory Visit (INDEPENDENT_AMBULATORY_CARE_PROVIDER_SITE_OTHER): Payer: Medicare Other | Admitting: Podiatry

## 2021-03-02 ENCOUNTER — Other Ambulatory Visit: Payer: Self-pay

## 2021-03-02 ENCOUNTER — Encounter: Payer: Self-pay | Admitting: Podiatry

## 2021-03-02 DIAGNOSIS — L97511 Non-pressure chronic ulcer of other part of right foot limited to breakdown of skin: Secondary | ICD-10-CM

## 2021-03-02 DIAGNOSIS — L309 Dermatitis, unspecified: Secondary | ICD-10-CM | POA: Diagnosis not present

## 2021-03-02 NOTE — Progress Notes (Signed)
Subjective:  Patient ID: [name], female    DOB: 1950/07/26,   MRN: [id number]  Chief Complaint  Patient presents with   Plantar Warts    2 weekf/u  R great toe - plantaar wart    71 y.o. female presents for follow-up of right great toe wound. Relates the toe is doing better since the last visit. Relates her other lesions are still there. She is unable to get into Dermatology until September. She does see [facility] and will try to use the referral for there.   Denies any other pedal complaints. Denies n/v/f/c.   Past Medical History:  Diagnosis Date   Anxiety    Cancer ([place])    basal skin cancer    DJD (degenerative joint disease) of hip    right - Alusio   Dyslipidemia    GERD (gastroesophageal reflux disease)    Glaucoma (increased eye pressure)    Hypertension    Hypothyroidism    Osteopenia    Pneumonia    hx of pneumonia - lst time 1998   PONV (postoperative nausea and vomiting)    Vitreous floaters of left eye 04/2013    Objective:  Physical Exam: Vascular: DP/PT pulses 2/4 bilateral. CFT <3 seconds. Normal hair growth on digits. No edema.  Skin. No lacerations or abrasions bilateral feet. Right hallux with healed wound with dry eschar like scabbing around that was removed.   Healing well. No erythema or edema noted. No purulence. Left hallux second and third digits with erythematous vesicle type lesions plantar aspect of toes.  Musculoskeletal: MMT 5/5 bilateral lower extremities in DF, PF, Inversion and Eversion. Deceased ROM in DF of ankle joint.  Neurological: Sensation intact to light touch.   Assessment:   1. Ulcer of right foot limited to breakdown of skin ([place])   2. Dermatitis of left foot      Plan:  Patient was evaluated and treated and all questions answered. Ulcer right hallux limited to breakdown of skin- healed .  Dermatitis of left foot.  -No debridement necessary  -No dressing necessary.  -External referral to dermatology  for skin lesions. Discussed possibility of dermatitis coming from certain shoe that she wears only in January vs vasculitis vs other skin pathology.  -Discussed if any worsening redness, pain, fever or chills to call or may need to report to the emergency room. Patient expressed understanding.  Return as needed.    No follow-ups on file.   Lorenda Peck, DPM

## 2021-04-18 ENCOUNTER — Encounter: Payer: Self-pay | Admitting: Family Medicine

## 2021-04-22 DIAGNOSIS — Z20822 Contact with and (suspected) exposure to covid-19: Secondary | ICD-10-CM | POA: Diagnosis not present

## 2021-06-15 DIAGNOSIS — H2513 Age-related nuclear cataract, bilateral: Secondary | ICD-10-CM | POA: Diagnosis not present

## 2021-06-15 DIAGNOSIS — H1045 Other chronic allergic conjunctivitis: Secondary | ICD-10-CM | POA: Diagnosis not present

## 2021-06-15 DIAGNOSIS — H401132 Primary open-angle glaucoma, bilateral, moderate stage: Secondary | ICD-10-CM | POA: Diagnosis not present

## 2021-06-15 DIAGNOSIS — H04123 Dry eye syndrome of bilateral lacrimal glands: Secondary | ICD-10-CM | POA: Diagnosis not present

## 2021-07-08 DIAGNOSIS — K029 Dental caries, unspecified: Secondary | ICD-10-CM | POA: Diagnosis not present

## 2021-09-10 ENCOUNTER — Encounter: Payer: Self-pay | Admitting: Family Medicine

## 2021-09-10 DIAGNOSIS — R030 Elevated blood-pressure reading, without diagnosis of hypertension: Secondary | ICD-10-CM | POA: Diagnosis not present

## 2021-09-10 DIAGNOSIS — J189 Pneumonia, unspecified organism: Secondary | ICD-10-CM | POA: Diagnosis not present

## 2021-09-10 DIAGNOSIS — Z20822 Contact with and (suspected) exposure to covid-19: Secondary | ICD-10-CM | POA: Diagnosis not present

## 2021-09-10 DIAGNOSIS — R042 Hemoptysis: Secondary | ICD-10-CM | POA: Diagnosis not present

## 2021-09-10 DIAGNOSIS — R509 Fever, unspecified: Secondary | ICD-10-CM | POA: Diagnosis not present

## 2021-09-15 ENCOUNTER — Encounter: Payer: Self-pay | Admitting: Family Medicine

## 2021-09-15 ENCOUNTER — Ambulatory Visit (INDEPENDENT_AMBULATORY_CARE_PROVIDER_SITE_OTHER): Payer: Medicare Other | Admitting: Family Medicine

## 2021-09-15 VITALS — BP 138/84 | HR 80 | Temp 98.9°F | Resp 16 | Ht 64.5 in | Wt 141.0 lb

## 2021-09-15 DIAGNOSIS — J189 Pneumonia, unspecified organism: Secondary | ICD-10-CM | POA: Diagnosis not present

## 2021-09-15 NOTE — Patient Instructions (Signed)
Follow up by phone or MyChart in the next 7-10 days and let me know if the blood resolves IF you continue to have bloody sputum, we will place a new referral to Pulmonary Finish the antibiotics and prednisone as directed Drink LOTS of fluids Call with any questions or concerns Hang in there!!!

## 2021-09-15 NOTE — Progress Notes (Signed)
   Subjective:    Patient ID: [name], female    DOB: 1950/10/09, 71 y.o.   MRN: [id number]  HPI PNA- pt was dx'd last week after developing productive cough.  Last Friday started to cough up bloody sputum.  Went to UC, had fever to 100.7.  Pt had CXR and dx'd w/ RUL and RML PNA.  Was prescribed Zpack and Prednisone.  Pt reports feeling better.  Less coughing but continues to cough up bloody sputum.  Sputum has changed from 'a lot of blood' to blood tinged.  At times will be bright red and other times darker- 'it looks older'.  Denies SOB.  No known sick contacts.  No volunteer opportunities at [employer], [place], food bank.     Review of Systems For ROS see HPI     Objective:   Physical Exam Vitals reviewed.  Constitutional:      General: She is not in acute distress.    Appearance: Normal appearance. She is not ill-appearing.  HENT:     Head: Normocephalic and atraumatic.     Mouth/Throat:     Mouth: Mucous membranes are moist.     Pharynx: No oropharyngeal exudate or posterior oropharyngeal erythema.  Eyes:     Extraocular Movements: Extraocular movements intact.     Conjunctiva/sclera: Conjunctivae normal.     Pupils: Pupils are equal, round, and reactive to light.  Cardiovascular:     Rate and Rhythm: Normal rate and regular rhythm.     Heart sounds: Normal heart sounds.  Pulmonary:     Effort: Pulmonary effort is normal. No respiratory distress.     Breath sounds: Normal breath sounds. No wheezing, rhonchi or rales.  Musculoskeletal:     Cervical back: Normal range of motion and neck supple.  Lymphadenopathy:     Cervical: No cervical adenopathy.  Skin:    General: Skin is warm and dry.  Neurological:     General: No focal deficit present.     Mental Status: She is alert and oriented to person, place, and time.  Psychiatric:        Mood and Affect: Mood normal.        Behavior: Behavior normal.        Thought Content: Thought content normal.            Assessment & Plan:  PNA- RUL and RML as noted on xray done at [facility].  Pt was prescribed Zpack and Prednisone.  She reports feeling much better.  Continues to have blood tinged sputum.  Suspect this is due to the PNA and amount of coughing but if this doesn't resolve in the next 7-10 days will need pulmonary referral.  Pt expressed understanding and is in agreement w/ plan.

## 2021-10-17 NOTE — Progress Notes (Unsigned)
Subjective:   Patient ID: [name], female    DOB: [birth year]    MRN: [id number]  Brief patient profile:  77  yowf quit smoking 2003 with h/o HBP and ? rml syndrome   History of Present Illness  June 26, 2008 cpx only c/o = hoarseness and indigestion off and on for up to sev years, worse for several months, better with tums and not eating as much late in evening. no cough or sob over baseline rec trial of diet only to reduce likelihood of gerd      11/29/2017  f/u ov/Tehila Sokolow re: hbp/ rml and lingular syndrome    Chief Complaint  Patient presents with   Follow-up    Pt has some SOB with exertion, wheezing on and off, and has productive cough-clear. Pt would like flu shot today.  Dyspnea:  MMRC1 = can walk nl pace, flat grade, can't hurry or go uphills or steps s sob   No regular work out Cough: sporadic/ little clear mucus Sleeping: bed flat/ props on 2 pillows  SABA use: no change with saba  02: none  Rec Please see patient coordinator before you leave today  to schedule Internal medicine referral for hbp Pulmonary follow up is as needed for flares of cough, short of breath or pain with breathing     10/18/2021  Re-establish  ov/Sheena Simonis re: RML/lingular syndrome s/p Pna 08/2021 90% back to baseline  Chief Complaint  Patient presents with   Pulmonary Consult    Had PNA 09/10/21- started with hemoptysis and also had fever- tx with pred and zpack. She still has some cough- prod with clear sputum.   Dyspnea:  no change ex tol vs baseline  Cough: slt yellow/ no more blood  Sleeping: flat bed/ two pillows  SABA use: none  02: none  Covid status:  vax x 3    No obvious day to day or daytime variability or assoc excess/ purulent sputum or mucus plugs or hemoptysis or cp or chest tightness, subjective wheeze or overt sinus or hb symptoms.   Sleeping  without nocturnal  or early am exacerbation  of respiratory  c/o's or need for noct saba. Also denies any obvious fluctuation of  symptoms with weather or environmental changes or other aggravating or alleviating factors except as outlined above   No unusual exposure hx or h/o childhood pna/ asthma or knowledge of premature birth.  Current Allergies, Complete Past Medical History, Past Surgical History, Family History, and Social History were reviewed in [facility] record.  ROS  The following are not active complaints unless bolded Hoarseness, sore throat, dysphagia, dental problems, itching, sneezing,  nasal congestion or discharge of excess mucus or purulent secretions, ear ache,   fever, chills, sweats, unintended wt loss or wt gain, classically pleuritic or exertional cp,  orthopnea pnd or arm/hand swelling  or leg swelling, presyncope, palpitations, abdominal pain, anorexia, nausea, vomiting, diarrhea  or change in bowel habits or change in bladder habits, change in stools or change in urine, dysuria, hematuria,  rash, arthralgias, visual complaints, headache, numbness, weakness or ataxia or problems with walking or coordination,  change in mood or  memory.        Current Meds  Medication Sig   aspirin 81 MG tablet Take 81 mg by mouth daily.   cholecalciferol (VITAMIN D3) 25 MCG (1000 UNIT) tablet Take 1,000 Units by mouth daily. 2 x daily   fluticasone (FLONASE) 50 MCG/ACT nasal spray Place into both nostrils  daily.   furosemide (LASIX) 20 MG tablet TAKE 1 TABLET(20 MG) BY MOUTH DAILY   levothyroxine (SYNTHROID) 100 MCG tablet TAKE 1 TABLET BY MOUTH EVERY DAY BEFORE BREAKFAST   Omega-3 Fatty Acids (FISH OIL) 1000 MG CAPS Take by mouth every other day.   Probiotic CAPS Take 1 capsule by mouth daily.   timolol (TIMOPTIC) 0.5 % ophthalmic solution 1 drop every morning.   Travoprost, BAK Free, (TRAVATAN) 0.004 % SOLN ophthalmic solution Place 1 drop into both eyes at bedtime.        Past Medical History:  HYPERTENSION (ICD-401.9)  HYPOTHYROIDISM (ICD-244.9)  DYSLIPIDEMIA (ICD-272.4)  -  Taret ldl < 130 pos fm hx, h/o smoking, hbp  OSTEOPENIA (ICD-733.90)  - DEXA 09/20/06  T spine-.7, Left Fem Neck -1.9, Right Fem neck -1.1 > declined f/u study August 18, 2009  RML Syndorme  - FOB 09/18/1996  DJD R Hip...............................................................................................Marland Kitchen   Alusio  HEALTH MAINTENANCE........................................................................Marland Kitchen Referred to IM 11/29/2017  - GYN = Raquel Sarna PA  - Colonoscopy neg 04/23/2004  - Td 06/2008  - Pneumovax 07/2004  Severe localized reaction/ prevnar 13 02/12/2014       Family History:  allergies in mother  lymphoma in mother  asthma in brother 54 years older only sibling  colon ca brother  Prostate ca same brother neg cva/aneurysms  IHD Father onset 79s  IPF in brother 38 y older   Social History:  Quit smokng Jan 2003   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       Objective:   Physical Exam  Wt Readings from Last 3 Encounters:  10/18/21 137 lb 12.8 oz (62.5 kg)  09/15/21 141 lb (64 kg)  08/18/20 151 lb 12.8 oz (68.9 kg)   11/29/2017    157  11/30/2011     159   August 2, [id number]  June 26, 2008  158    Vital signs reviewed  10/18/2021  - Note at rest 02 sats  96% on RA   General appearance:    amb wf nad   HEENT : Oropharynx  clear     Nasal turbinates nl    NECK :  without  apparent JVD/ palpable Nodes/TM    LUNGS: no acc muscle use,  Nl contour chest with minimal insp/exp rhonchi  bilaterally without cough on insp or exp maneuvers   CV:  RRR  no s3 or murmur or increase in P2, and no edema   ABD:  soft and nontender with nl inspiratory excursion in the supine position. No bruits or organomegaly appreciated   MS:  Nl gait/ ext warm without deformities Or obvious joint restrictions  calf tenderness, cyanosis or clubbing    SKIN: warm and dry without lesions    NEURO:  alert, approp, nl sensorium with  no motor or cerebellar deficits apparent.        CXR PA and  Lateral:   10/18/2021 :    I personally reviewed images and impression is as follows:     Increased scarring esp RUL RML                  .   Assessment & Plan:

## 2021-10-18 ENCOUNTER — Ambulatory Visit (INDEPENDENT_AMBULATORY_CARE_PROVIDER_SITE_OTHER): Payer: Medicare Other

## 2021-10-18 ENCOUNTER — Encounter: Payer: Self-pay | Admitting: Internal Medicine

## 2021-10-18 ENCOUNTER — Ambulatory Visit (INDEPENDENT_AMBULATORY_CARE_PROVIDER_SITE_OTHER): Payer: Medicare Other | Admitting: Internal Medicine

## 2021-10-18 DIAGNOSIS — J9819 Other pulmonary collapse: Secondary | ICD-10-CM | POA: Diagnosis not present

## 2021-10-18 DIAGNOSIS — J479 Bronchiectasis, uncomplicated: Secondary | ICD-10-CM | POA: Diagnosis not present

## 2021-10-18 DIAGNOSIS — R918 Other nonspecific abnormal finding of lung field: Secondary | ICD-10-CM | POA: Diagnosis not present

## 2021-10-18 NOTE — Assessment & Plan Note (Addendum)
Quit smoking  2003 - CT chest  08/19/17 Consolidative opacities with associated bronchiectasis within the right middle lobe and lingula (representative images 89, series 6). There are additional areas of bronchiectasis within the right upper lobe (representative image 49, series 6) .   S/p acute flare already improved but needs baseline cxxr in 6 weeks and w/u for bronchiectasis at that time to include quant Igs,  Alpha one phenotype, pfts and sputum for afb and resistant gnr if sputum still discolored  In meantim rec rx mucinex dm 1200 mg bid prn          Each maintenance medication was reviewed in detail including emphasizing most importantly the difference between maintenance and prns and under what circumstances the prns are to be triggered using an action plan format where appropriate.  Total time for H and P, chart review, counseling, reviewing  and generating customized AVS unique to this office visit / same day charting  > 30 min for pt not seen > 3 years

## 2021-10-18 NOTE — Assessment & Plan Note (Signed)
S/p FOB 09/18/96 with patent airways  - see CTa 08/20/16 RML/lingular changes as seen also on plain film  See bronchiectasis

## 2021-10-18 NOTE — Patient Instructions (Addendum)
Please remember to go to the  x-ray department  for your tests - we will call you with the results when they are available     Please schedule a follow up office visit in 6 weeks, call sooner if needed  needs quant Igs,  Alpha one phenotype, pfts and sputum for afb and resistant gnr if sputum still discolored

## 2021-10-20 NOTE — Progress Notes (Signed)
Spoke with pt and notified of results per Dr. Melvyn Novas. Pt verbalized understanding. She seen her mychart report and wants clarification on "question atypical infection". Dr Melvyn Novas- please advise, thanks!

## 2021-11-09 ENCOUNTER — Telehealth: Payer: Self-pay | Admitting: Family Medicine

## 2021-11-09 NOTE — Telephone Encounter (Signed)
Left message for patient to call back and schedule Medicare Annual Wellness Visit (AWV).   Please offer to do virtually or by telephone.  Left office number and my jabber [phone].  Last AWV:11/02/2020  Please schedule at anytime with Nurse Health Advisor.

## 2021-11-16 DIAGNOSIS — Z130 Encounter for screening for diseases of the blood and blood-forming organs and certain disorders involving the immune mechanism: Secondary | ICD-10-CM | POA: Diagnosis not present

## 2021-11-16 DIAGNOSIS — H4089 Other specified glaucoma: Secondary | ICD-10-CM | POA: Diagnosis not present

## 2021-11-16 DIAGNOSIS — M8589 Other specified disorders of bone density and structure, multiple sites: Secondary | ICD-10-CM | POA: Diagnosis not present

## 2021-11-16 DIAGNOSIS — E039 Hypothyroidism, unspecified: Secondary | ICD-10-CM | POA: Diagnosis not present

## 2021-11-16 DIAGNOSIS — Z13220 Encounter for screening for lipoid disorders: Secondary | ICD-10-CM | POA: Diagnosis not present

## 2021-11-16 DIAGNOSIS — Z0000 Encounter for general adult medical examination without abnormal findings: Secondary | ICD-10-CM | POA: Diagnosis not present

## 2021-11-16 DIAGNOSIS — J479 Bronchiectasis, uncomplicated: Secondary | ICD-10-CM | POA: Diagnosis not present

## 2021-11-16 DIAGNOSIS — E785 Hyperlipidemia, unspecified: Secondary | ICD-10-CM | POA: Diagnosis not present

## 2021-11-16 DIAGNOSIS — Z6823 Body mass index (BMI) 23.0-23.9, adult: Secondary | ICD-10-CM | POA: Diagnosis not present

## 2021-11-25 DIAGNOSIS — Z23 Encounter for immunization: Secondary | ICD-10-CM | POA: Diagnosis not present

## 2021-11-28 NOTE — Progress Notes (Unsigned)
Subjective:   Patient ID: [name], female    DOB: 03-Mar-1950    MRN: [id number]  Brief patient profile:  59  yowf quit smoking 2003 with h/o HBP and ? rml syndrome   History of Present Illness  June 26, 2008 cpx only c/o = hoarseness and indigestion off and on for up to sev years, worse for several months, better with tums and not eating as much late in evening. no cough or sob over baseline rec trial of diet only to reduce likelihood of gerd      11/29/2017  f/u ov/Mckena Chern re: hbp/ rml and lingular syndrome    Chief Complaint  Patient presents with   Follow-up    Pt has some SOB with exertion, wheezing on and off, and has productive cough-clear. Pt would like flu shot today.  Dyspnea:  MMRC1 = can walk nl pace, flat grade, can't hurry or go uphills or steps s sob   No regular work out Cough: sporadic/ little clear mucus Sleeping: bed flat/ props on 2 pillows  SABA use: no change with saba  02: none  Rec Please see patient coordinator before you leave today  to schedule Internal medicine referral for hbp Pulmonary follow up is as needed for flares of cough, short of breath or pain with breathing     10/18/2021  Re-establish  ov/Hamzeh Tall re: RML/lingular syndrome s/p Pna 08/2021 90% back to baseline  Chief Complaint  Patient presents with   Pulmonary Consult    Had PNA 09/10/21- started with hemoptysis and also had fever- tx with pred and zpack. She still has some cough- prod with clear sputum.   Dyspnea:  no change ex tol vs baseline  Cough: slt yellow/ no more blood  Sleeping: flat bed/ two pillows  SABA use: none  02: none  Covid status:  vax x 3  Rec  No change rx   11/29/2021  f/u ov/Javionna Leder re: ***   maint on *** needs quant Igs,  Alpha one phenotype, pfts and sputum for afb and resistant gnr if sputum still discolored  No chief complaint on file.   Dyspnea:  *** Cough: *** Sleeping: *** SABA use: *** 02: *** Covid status:   *** Lung cancer screening :  ***     No obvious day to day or daytime variability or assoc excess/ purulent sputum or mucus plugs or hemoptysis or cp or chest tightness, subjective wheeze or overt sinus or hb symptoms.   *** without nocturnal  or early am exacerbation  of respiratory  c/o's or need for noct saba. Also denies any obvious fluctuation of symptoms with weather or environmental changes or other aggravating or alleviating factors except as outlined above   No unusual exposure hx or h/o childhood pna/ asthma or knowledge of premature birth.  Current Allergies, Complete Past Medical History, Past Surgical History, Family History, and Social History were reviewed in [facility] record.  ROS  The following are not active complaints unless bolded Hoarseness, sore throat, dysphagia, dental problems, itching, sneezing,  nasal congestion or discharge of excess mucus or purulent secretions, ear ache,   fever, chills, sweats, unintended wt loss or wt gain, classically pleuritic or exertional cp,  orthopnea pnd or arm/hand swelling  or leg swelling, presyncope, palpitations, abdominal pain, anorexia, nausea, vomiting, diarrhea  or change in bowel habits or change in bladder habits, change in stools or change in urine, dysuria, hematuria,  rash, arthralgias, visual complaints, headache, numbness, weakness or ataxia  or problems with walking or coordination,  change in mood or  memory.        No outpatient medications have been marked as taking for the 11/29/21 encounter (Appointment) with [name], MD.           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/1996  DJD R  Hip...............................................................................................Marland Kitchen   Alusio  HEALTH MAINTENANCE........................................................................Marland Kitchen Referred to IM 11/29/2017  - GYN = Grubb PA  - Colonoscopy neg 04/23/2004  - Td 06/2008  - Pneumovax 07/2004  Severe localized reaction/ prevnar 13 02/12/2014       Family History:  allergies in mother  lymphoma in mother  asthma in brother 74 years older only sibling  colon ca brother  Prostate ca same brother neg cva/aneurysms  IHD Father onset 7s  IPF in brother 10 y older   Social History:  Quit smokng Jan 2003   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   Assessment & Plan:

## 2021-11-29 ENCOUNTER — Ambulatory Visit (INDEPENDENT_AMBULATORY_CARE_PROVIDER_SITE_OTHER): Payer: Medicare Other | Admitting: Internal Medicine

## 2021-11-29 ENCOUNTER — Encounter: Payer: Self-pay | Admitting: Internal Medicine

## 2021-11-29 ENCOUNTER — Ambulatory Visit (INDEPENDENT_AMBULATORY_CARE_PROVIDER_SITE_OTHER): Payer: Medicare Other

## 2021-11-29 VITALS — BP 140/76 | HR 77 | Temp 98.1°F | Ht 65.0 in | Wt 136.8 lb

## 2021-11-29 DIAGNOSIS — J479 Bronchiectasis, uncomplicated: Secondary | ICD-10-CM

## 2021-11-29 DIAGNOSIS — R059 Cough, unspecified: Secondary | ICD-10-CM | POA: Diagnosis not present

## 2021-11-29 LAB — CBC WITH DIFFERENTIAL/PLATELET
Basophils Absolute: 0 10*3/uL (ref 0.0–0.1)
Basophils Relative: 0.6 % (ref 0.0–3.0)
Eosinophils Absolute: 0.2 10*3/uL (ref 0.0–0.7)
Eosinophils Relative: 2.2 % (ref 0.0–5.0)
HCT: 39.9 % (ref 36.0–46.0)
Hemoglobin: 13.7 g/dL (ref 12.0–15.0)
Lymphocytes Relative: 22.6 % (ref 12.0–46.0)
Lymphs Abs: 1.5 10*3/uL (ref 0.7–4.0)
MCHC: 34.4 g/dL (ref 30.0–36.0)
MCV: 89.1 fl (ref 78.0–100.0)
Monocytes Absolute: 0.8 10*3/uL (ref 0.1–1.0)
Monocytes Relative: 11.5 % (ref 3.0–12.0)
Neutro Abs: 4.3 10*3/uL (ref 1.4–7.7)
Neutrophils Relative %: 63.1 % (ref 43.0–77.0)
Platelets: 266 10*3/uL (ref 150.0–400.0)
RBC: 4.48 Mil/uL (ref 3.87–5.11)
RDW: 13.4 % (ref 11.5–15.5)
WBC: 6.8 10*3/uL (ref 4.0–10.5)

## 2021-11-29 NOTE — Assessment & Plan Note (Addendum)
Quit smoking  2003 - CT chest  08/19/17 Consolidative opacities with associated bronchiectasis within the right middle lobe and lingula (representative images 89, series 6). There are additional areas of bronchiectasis within the right upper lobe (representative image 49, series 6) .  -  11/29/2021  quant TB, Quant Ig's alpha one screen,esr  -  HRCT ordered 11/29/2021    Clinically behaving more like bronchiectasis than ILD so proceed with w/u to distinguish these two from each other and f/u in 3 m with pfts          Each maintenance medication was reviewed in detail including emphasizing most importantly the difference between maintenance and prns and under what circumstances the prns are to be triggered using an action plan format where appropriate.  Total time for H and P, chart review, counseling,  and generating customized AVS unique to this office visit / same day charting = 25 min

## 2021-11-29 NOTE — Patient Instructions (Signed)
Please remember to go to the lab and x-ray department  for your tests - we will call you with the results when they are available.       Please schedule a follow up visit in 3 months but call sooner if needed  with  PFTs on return

## 2021-12-01 ENCOUNTER — Telehealth: Payer: Self-pay | Admitting: Internal Medicine

## 2021-12-01 DIAGNOSIS — J479 Bronchiectasis, uncomplicated: Secondary | ICD-10-CM

## 2021-12-01 LAB — IGG, IGA, IGM
IgG (Immunoglobin G), Serum: 1472 mg/dL (ref 600–1540)
IgM, Serum: 172 mg/dL (ref 50–300)
Immunoglobulin A: 249 mg/dL (ref 70–320)

## 2021-12-01 LAB — IGE: IgE (Immunoglobulin E), Serum: <2 kU/L (ref <=114)

## 2021-12-01 NOTE — Telephone Encounter (Signed)
Patient stated she missed a call from the nurse.  Would like a call back.  CB# [phone]

## 2021-12-01 NOTE — Telephone Encounter (Signed)
[name], MD 12/01/2021  6:29 AM EST     Call pt:  Reviewed cxr and show no evidence of any acute problem or cancer but has same pattern as before which at this point would be better evaluated by HRCT  which is what I recommend    Called and spoke with pt letting her know the results of the cxr per MW and she verbalized understanding. Order for HRCT has been placed. Nothing further needed.

## 2021-12-01 NOTE — Progress Notes (Signed)
Called pt and there was no answer, LMTCB.  

## 2021-12-02 LAB — QUANTIFERON-TB GOLD PLUS
Mitogen-NIL: >10 IU/mL
NIL: 0.04 IU/mL
QuantiFERON-TB Gold Plus: NEGATIVE
TB1-NIL: 0 IU/mL
TB2-NIL: <0 IU/mL

## 2021-12-03 LAB — ALPHA-1-ANTITRYPSIN PHENOTYP: A-1 Antitrypsin: 178 mg/dL (ref 101–187)

## 2021-12-06 DIAGNOSIS — I788 Other diseases of capillaries: Secondary | ICD-10-CM | POA: Diagnosis not present

## 2021-12-06 DIAGNOSIS — L245 Irritant contact dermatitis due to other chemical products: Secondary | ICD-10-CM | POA: Diagnosis not present

## 2021-12-06 DIAGNOSIS — D225 Melanocytic nevi of trunk: Secondary | ICD-10-CM | POA: Diagnosis not present

## 2021-12-06 DIAGNOSIS — L821 Other seborrheic keratosis: Secondary | ICD-10-CM | POA: Diagnosis not present

## 2021-12-06 DIAGNOSIS — L820 Inflamed seborrheic keratosis: Secondary | ICD-10-CM | POA: Diagnosis not present

## 2021-12-06 DIAGNOSIS — Z85828 Personal history of other malignant neoplasm of skin: Secondary | ICD-10-CM | POA: Diagnosis not present

## 2021-12-06 DIAGNOSIS — L720 Epidermal cyst: Secondary | ICD-10-CM | POA: Diagnosis not present

## 2021-12-24 DIAGNOSIS — H524 Presbyopia: Secondary | ICD-10-CM | POA: Diagnosis not present

## 2021-12-24 DIAGNOSIS — H2513 Age-related nuclear cataract, bilateral: Secondary | ICD-10-CM | POA: Diagnosis not present

## 2021-12-24 DIAGNOSIS — H5213 Myopia, bilateral: Secondary | ICD-10-CM | POA: Diagnosis not present

## 2021-12-24 DIAGNOSIS — H401132 Primary open-angle glaucoma, bilateral, moderate stage: Secondary | ICD-10-CM | POA: Diagnosis not present

## 2021-12-24 DIAGNOSIS — H1045 Other chronic allergic conjunctivitis: Secondary | ICD-10-CM | POA: Diagnosis not present

## 2021-12-24 DIAGNOSIS — H35371 Puckering of macula, right eye: Secondary | ICD-10-CM | POA: Diagnosis not present

## 2021-12-24 DIAGNOSIS — H04123 Dry eye syndrome of bilateral lacrimal glands: Secondary | ICD-10-CM | POA: Diagnosis not present

## 2021-12-24 DIAGNOSIS — H52213 Irregular astigmatism, bilateral: Secondary | ICD-10-CM | POA: Diagnosis not present

## 2022-01-04 ENCOUNTER — Other Ambulatory Visit: Payer: Medicare Other

## 2022-01-20 ENCOUNTER — Ambulatory Visit
Admission: RE | Admit: 2022-01-20 | Discharge: 2022-01-20 | Disposition: A | Discharge status: Home / Self Care | Payer: Medicare Other | Source: Ambulatory Visit | Attending: Internal Medicine | Admitting: Internal Medicine

## 2022-01-20 DIAGNOSIS — J479 Bronchiectasis, uncomplicated: Secondary | ICD-10-CM | POA: Diagnosis not present

## 2022-01-20 DIAGNOSIS — K449 Diaphragmatic hernia without obstruction or gangrene: Secondary | ICD-10-CM | POA: Diagnosis not present

## 2022-01-20 DIAGNOSIS — R918 Other nonspecific abnormal finding of lung field: Secondary | ICD-10-CM | POA: Diagnosis not present

## 2022-01-20 DIAGNOSIS — I700 Atherosclerosis of aorta: Secondary | ICD-10-CM | POA: Diagnosis not present

## 2022-01-25 ENCOUNTER — Other Ambulatory Visit: Payer: Self-pay

## 2022-01-25 MED ORDER — AZITHROMYCIN 250 MG PO TABS: ORAL_TABLET | ORAL | 6 refills | Status: DC

## 2022-03-06 NOTE — Progress Notes (Unsigned)
Subjective:   Patient ID: [name], female    DOB: [birth year]    MRN: [id number]  Brief patient profile:  2  yowf  quit smoking 2003 with h/o HBP and ? rml syndrome   History of Present Illness  June 26, 2008 cpx only c/o = hoarseness and indigestion off and on for up to sev years, worse for several months, better with tums and not eating as much late in evening. no cough or sob over baseline rec trial of diet only to reduce likelihood of gerd       10/18/2021  Re-establish  ov/[name] re: RML/lingular syndrome s/p Pna 08/2021 90% back to baseline  Chief Complaint  Patient presents with   Pulmonary Consult    Had PNA 09/10/21- started with hemoptysis and also had fever- tx with pred and zpack. She still has some cough- prod with clear sputum.   Dyspnea:  no change ex tol vs baseline  Cough: slt yellow/ no more blood  Sleeping: flat bed/ two pillows  SABA use: none  02: none  Covid status:  vax x 3  Rec  No change rx   11/29/2021  f/u ov/[name] re: bronchiectasis    maint on no rx   Chief Complaint  Patient presents with   Follow-up    Discuss CXR .  Cough persistent.  Dyspnea:  back near baseline ex tol Cough: no change from normal = slt yellowish Sleeping: flat bed 2 pillows no resp cc / no consistent am flares  SABA use: none  02: none   Rec Please remember to go to the lab and x-ray department  for your tests - we will call you with the results when they are available. Please schedule a follow up visit in 3 months but call sooner if needed  with  PFTs on return   03/07/2022  f/u ov/[name] re: bronchiectasis  maint on ***  No chief complaint on file.   Dyspnea:  *** Cough: *** Sleeping: *** SABA use: *** 02: *** Covid status:   *** Lung cancer screening :  ***    No obvious day to day or daytime variability or assoc excess/ purulent sputum or mucus plugs or hemoptysis or cp or chest tightness, subjective wheeze or overt sinus or hb symptoms.   *** without  nocturnal  or early am exacerbation  of respiratory  c/o's or need for noct saba. Also denies any obvious fluctuation of symptoms with weather or environmental changes or other aggravating or alleviating factors except as outlined above   No unusual exposure hx or h/o childhood pna/ asthma or knowledge of premature birth.  Current Allergies, Complete Past Medical History, Past Surgical History, Family History, and Social History were reviewed in [employer] record.  ROS  The following are not active complaints unless bolded Hoarseness, sore throat, dysphagia, dental problems, itching, sneezing,  nasal congestion or discharge of excess mucus or purulent secretions, ear ache,   fever, chills, sweats, unintended wt loss or wt gain, classically pleuritic or exertional cp,  orthopnea pnd or arm/hand swelling  or leg swelling, presyncope, palpitations, abdominal pain, anorexia, nausea, vomiting, diarrhea  or change in bowel habits or change in bladder habits, change in stools or change in urine, dysuria, hematuria,  rash, arthralgias, visual complaints, headache, numbness, weakness or ataxia or problems with walking or coordination,  change in mood or  memory.        No outpatient medications have been marked as taking for the  03/07/22 encounter (Appointment) with [name], MD.        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/1996  DJD R Hip...............................................................................................Marland Kitchen   Alusio  HEALTH MAINTENANCE........................................................................Marland Kitchen Referred to IM 11/29/2017  - GYN = Grubb PA  - Colonoscopy neg 04/23/2004  -  Td 06/2008  - Pneumovax 07/2004  Severe localized reaction/ prevnar 13 02/12/2014       Family History:  allergies in mother  lymphoma in mother  asthma in brother 22 years older only sibling  colon ca brother  Prostate ca same brother neg cva/aneurysms  IHD Father onset 50s  IPF in brother 65 y older   Social History:  Quit smokng Jan 2003   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   Assessment & Plan:

## 2022-03-07 ENCOUNTER — Encounter: Payer: Self-pay | Admitting: Internal Medicine

## 2022-03-07 ENCOUNTER — Ambulatory Visit (INDEPENDENT_AMBULATORY_CARE_PROVIDER_SITE_OTHER): Payer: Medicare Other | Admitting: Internal Medicine

## 2022-03-07 VITALS — BP 140/84 | HR 92 | Temp 98.0°F | Ht 63.5 in | Wt 136.0 lb

## 2022-03-07 DIAGNOSIS — J479 Bronchiectasis, uncomplicated: Secondary | ICD-10-CM | POA: Diagnosis not present

## 2022-03-07 NOTE — Progress Notes (Signed)
Full PFT completed today 

## 2022-03-07 NOTE — Assessment & Plan Note (Addendum)
Quit smoking  2003 - CT chest  08/19/17 Consolidative opacities with associated bronchiectasis within the right middle lobe and lingula (representative images 89, series 6). There are additional areas of bronchiectasis within the right upper lobe (representative image 49, series 6) .  -  11/29/2021  quant TB neg  Ig's nl   alpha one   MM level 178 -  HRCT  01/20/22  worse vs priors with bronchiectasis/ MPNs > rec zpak prn flares and consult  Dr Silas Flood - PFTs 03/07/2022  min airflow obstruction  with 1% resp to saba on Timolol eyedrops   No indication for now for bronchodilators or abx but concerned about MAI here and since not really coughin up much mucus will refer to Dr Silas Flood ? Need for fob/bal at this point or wait until flare?  In meantime has zpak to use prn flare.         Each maintenance medication was reviewed in detail including emphasizing most importantly the difference between maintenance and prns and under what circumstances the prns are to be triggered using an action plan format where appropriate.  Total time for H and P, chart review, counseling,   and generating customized AVS unique to this office visit / same day charting = 25 min

## 2022-03-07 NOTE — Patient Instructions (Addendum)
Consider changing timoptic to Betoptic before we add any beta activators (discuss with eye doctor next visit)   Keep appt Hunsucker and follow up as directed by him

## 2022-03-10 LAB — PULMONARY FUNCTION TEST
DL/VA % pred: 101 %
DL/VA: 4.18 ml/min/mmHg/L
DLCO cor % pred: 69 %
DLCO cor: 13.22 ml/min/mmHg
DLCO unc % pred: 69 %
DLCO unc: 13.22 ml/min/mmHg
FEF 25-75 Post: 1.05 L/sec
FEF 25-75 Pre: 0.97 L/sec
FEF2575-%Change-Post: 8 %
FEF2575-%Pred-Post: 60 %
FEF2575-%Pred-Pre: 56 %
FEV1-%Change-Post: 1 %
FEV1-%Pred-Post: 53 %
FEV1-%Pred-Pre: 53 %
FEV1-Post: 1.14 L
FEV1-Pre: 1.13 L
FEV1FVC-%Change-Post: 0 %
FEV1FVC-%Pred-Pre: 105 %
FEV6-%Change-Post: 1 %
FEV6-%Pred-Post: 53 %
FEV6-%Pred-Pre: 52 %
FEV6-Post: 1.44 L
FEV6-Pre: 1.42 L
FEV6FVC-%Pred-Post: 104 %
FEV6FVC-%Pred-Pre: 104 %
FVC-%Change-Post: 1 %
FVC-%Pred-Post: 51 %
FVC-%Pred-Pre: 50 %
FVC-Post: 1.44 L
Post FEV1/FVC ratio: 79 %
Post FEV6/FVC ratio: 100 %
Pre FEV1/FVC ratio: 80 %
Pre FEV6/FVC Ratio: 100 %
RV % pred: 183 %
RV: 4.09 L
TLC % pred: 111 %
TLC: 5.54 L

## 2022-03-22 DIAGNOSIS — S90862A Insect bite (nonvenomous), left foot, initial encounter: Secondary | ICD-10-CM | POA: Diagnosis not present

## 2022-03-22 DIAGNOSIS — Z1330 Encounter for screening examination for mental health and behavioral disorders, unspecified: Secondary | ICD-10-CM | POA: Diagnosis not present

## 2022-03-22 DIAGNOSIS — W57XXXA Bitten or stung by nonvenomous insect and other nonvenomous arthropods, initial encounter: Secondary | ICD-10-CM | POA: Diagnosis not present

## 2022-03-30 DIAGNOSIS — H52213 Irregular astigmatism, bilateral: Secondary | ICD-10-CM | POA: Diagnosis not present

## 2022-03-30 DIAGNOSIS — H401132 Primary open-angle glaucoma, bilateral, moderate stage: Secondary | ICD-10-CM | POA: Diagnosis not present

## 2022-03-30 DIAGNOSIS — H524 Presbyopia: Secondary | ICD-10-CM | POA: Diagnosis not present

## 2022-03-30 DIAGNOSIS — H43813 Vitreous degeneration, bilateral: Secondary | ICD-10-CM | POA: Diagnosis not present

## 2022-03-30 DIAGNOSIS — H5213 Myopia, bilateral: Secondary | ICD-10-CM | POA: Diagnosis not present

## 2022-03-30 DIAGNOSIS — H2513 Age-related nuclear cataract, bilateral: Secondary | ICD-10-CM | POA: Diagnosis not present

## 2022-03-30 DIAGNOSIS — H35371 Puckering of macula, right eye: Secondary | ICD-10-CM | POA: Diagnosis not present

## 2022-04-05 DIAGNOSIS — Z85828 Personal history of other malignant neoplasm of skin: Secondary | ICD-10-CM | POA: Diagnosis not present

## 2022-04-05 DIAGNOSIS — L309 Dermatitis, unspecified: Secondary | ICD-10-CM | POA: Diagnosis not present

## 2022-04-25 ENCOUNTER — Ambulatory Visit: Payer: Medicare Other | Admitting: Pulmonary Disease

## 2022-04-25 ENCOUNTER — Encounter: Payer: Self-pay | Admitting: Pulmonary Disease

## 2022-04-25 VITALS — BP 128/70 | HR 74 | Ht 64.0 in | Wt 133.2 lb

## 2022-04-25 DIAGNOSIS — J479 Bronchiectasis, uncomplicated: Secondary | ICD-10-CM | POA: Diagnosis not present

## 2022-04-25 DIAGNOSIS — J9819 Other pulmonary collapse: Secondary | ICD-10-CM

## 2022-04-25 NOTE — Patient Instructions (Addendum)
Nice to meet you  I recommend using a flutter valve, use this for 2 to 5 minutes twice a day.  You blow out through it, slow breaths.  To do it continuously for the 2 to 5 minutes you can take small breaks.  I gave sputum cups.  Please follow the instructions and return these to [place].  Please cough sputum into them.  Please call if you have worsening cough or shortness of breath any coughing up blood etc.  Return to clinic in 3 months or sooner as needed with Dr. Judeth Horn

## 2022-04-26 ENCOUNTER — Telehealth: Payer: Self-pay | Admitting: Pulmonary Disease

## 2022-04-26 NOTE — Telephone Encounter (Signed)
Patient would like to discuss flutter valve. Patient phone number is [phone].

## 2022-04-27 NOTE — Telephone Encounter (Signed)
Spoke with patient. Patient states she doesn't feel like she breathes out as long while using flutter valve. She holds breath in for 2 seconds then blows out for 4. She wonders if increase in resistance would help? And also are the flutter valves able to come apart to clean. She has watched you tube videos where people are taking them apart but her's doesn't seem to.  Dr. Judeth Horn please advise?

## 2022-04-27 NOTE — Telephone Encounter (Signed)
Pt is using the Portex and is almost certain it does not come apart. Pt has been using Dawn soap and soaking in boiling water. States when using valve this morning she was able to breath out slightly longer  Routing to Dr. Judeth Horn as an Lorain Childes

## 2022-04-27 NOTE — Telephone Encounter (Signed)
Yes can try increasing resistance.  Which flutter valve is she using?

## 2022-04-28 NOTE — Telephone Encounter (Signed)
I called the pt and there was no answer- LMTCB. ?

## 2022-04-28 NOTE — Telephone Encounter (Signed)
I was able to google and find videos with instructions for several products. The portex acapella? If needed we can order a new one.

## 2022-04-28 NOTE — Telephone Encounter (Signed)
Patient has already been taken care of in another encounter. Nothing further needed

## 2022-04-28 NOTE — Telephone Encounter (Signed)
Pt. Calling back to get further instructions and missed the call but she said [name] called but not sure if calling about the same thing if it is she said she doesn't need to speak to anyone

## 2022-05-13 DIAGNOSIS — L309 Dermatitis, unspecified: Secondary | ICD-10-CM | POA: Diagnosis not present

## 2022-05-13 DIAGNOSIS — Z85828 Personal history of other malignant neoplasm of skin: Secondary | ICD-10-CM | POA: Diagnosis not present

## 2022-05-30 ENCOUNTER — Telehealth: Payer: Self-pay | Admitting: Pulmonary Disease

## 2022-05-30 NOTE — Telephone Encounter (Signed)
Pt coughing up blood 

## 2022-05-31 ENCOUNTER — Encounter: Payer: Self-pay | Admitting: Internal Medicine

## 2022-05-31 NOTE — Telephone Encounter (Signed)
Take zpak and stay off her feet   Max mucinex  = mucinex dm 1200 mg twice daily  To er if worse

## 2022-05-31 NOTE — Telephone Encounter (Signed)
Called and spoke w/ pt she reports that she has began coughing up blood yesterday afternoon. She reports the blood is a brighter red. She says that this has happened 3-4x thus far since yesterday.   Pt reports that she has not taken her on hand z-pak yet and wanted advise before.   Sending to MW since Dr.Hunsucker is currently out of office.

## 2022-05-31 NOTE — Telephone Encounter (Signed)
ATC pt left detailed VM w. MW instructions to pt. Instructed her to call back w/ any questions or concerns and to present to ER if worsening.

## 2022-05-31 NOTE — Telephone Encounter (Signed)
Patient checking on message sent yesterday. Patient phone number is [phone].

## 2022-06-02 ENCOUNTER — Encounter: Payer: Self-pay | Admitting: Pulmonary Disease

## 2022-06-02 MED ORDER — DOXYCYCLINE HYCLATE 100 MG PO TABS: 100.0000 mg | ORAL_TABLET | Freq: Two times a day (BID) | ORAL | 0 refills | Status: AC

## 2022-06-02 NOTE — Telephone Encounter (Signed)
Pt called the office back. Stated to her that we were calling her about the mychart message she sent [place]. Asked her if she was still currently taking the zpak and she said that she was.   She said that with almost all abx she takes, she will develop a rash. Pt said she has been tested for penicillin as she was told that she was allergic to penicillin and was told that she was not allergic to it.  Pt said after she was told that she was not allergic to it when she had the testing performed, she did take a medication that had it in it and she then also broke out in a rash.   Pt said she is going to continue to take the zpak until it is completely gone. Stated that she has been taking zyrtec at night which was suggested by the pharmacist and also has been using lotions or creams to help with the rash and itching.  Routing this info to Dr. Judeth Horn as an Lorain Childes.

## 2022-06-02 NOTE — Telephone Encounter (Signed)
Attempted to call pt but unable to reach. Left message to return call.  

## 2022-06-10 NOTE — Telephone Encounter (Signed)
Dr. Judeth Horn please advise on the following My Chart message:   [name] (supporting Karren Burly, MD)2 days ago    Dr. Judeth Horn, I am on day 6 of the doxycycline - rash from Zpack never went away and ebbs and flows with Doxycycline - I can deal with it.  I have been taking Zyrtec at night and using Cere Ve itch cream for rash. I have not coughed up blood since Friday, and I think I am coughing less in general. When I do have sputum, it seems darker than usual to me. I stopped using the flutter device the day I coughed up blood - should I restart?  Also, should I continue with the Zyrtec? Thanks for your help! [name]  Thank you

## 2022-06-14 DIAGNOSIS — H1045 Other chronic allergic conjunctivitis: Secondary | ICD-10-CM | POA: Diagnosis not present

## 2022-06-14 DIAGNOSIS — H35371 Puckering of macula, right eye: Secondary | ICD-10-CM | POA: Diagnosis not present

## 2022-06-14 DIAGNOSIS — H2513 Age-related nuclear cataract, bilateral: Secondary | ICD-10-CM | POA: Diagnosis not present

## 2022-06-14 DIAGNOSIS — H401132 Primary open-angle glaucoma, bilateral, moderate stage: Secondary | ICD-10-CM | POA: Diagnosis not present

## 2022-08-16 ENCOUNTER — Encounter: Payer: Self-pay | Admitting: Pulmonary Disease

## 2022-08-16 ENCOUNTER — Ambulatory Visit: Payer: Medicare Other | Admitting: Pulmonary Disease

## 2022-08-16 VITALS — BP 132/70 | HR 86 | Ht 64.0 in | Wt 127.2 lb

## 2022-08-16 DIAGNOSIS — R042 Hemoptysis: Secondary | ICD-10-CM | POA: Diagnosis not present

## 2022-08-16 DIAGNOSIS — J9819 Other pulmonary collapse: Secondary | ICD-10-CM | POA: Diagnosis not present

## 2022-08-16 MED ORDER — DOXYCYCLINE HYCLATE 100 MG PO TABS: 100.0000 mg | ORAL_TABLET | Freq: Two times a day (BID) | ORAL | 0 refills | Status: AC

## 2022-08-16 NOTE — Patient Instructions (Addendum)
Nice to see you again  Use flutter valve 3-4 times a day with increase congestion then can decrease to 2 times a day when feeling overall well.  I sent doxycyline to the pharmacy to use if not improving over the next few days   Return to clinic in 3 months or sooner

## 2022-08-16 NOTE — Progress Notes (Signed)
@Patient  ID: [name], female    DOB: 1951/01/06, 72 y.o.   MRN: [id number]  Chief Complaint  Patient presents with   Consult    Pt is here fror a consult from Dr Sherene Sires. Pt is unsure the reason for this consult.     Referring provider: Sheliah Hatch, MD  HPI:   72 y.o. woman whom we are seeing for evaluation of bronchiectasis.  Followed by Dr. Sherene Sires.  Multiple pulmonary notes reviewed.  Has had bronchiectasis on imaging only since 2019.  Multiple images reviewed.  Has some cough.  No clear microbiology, bacteria or AFB in the system.  Bronchiectasis looks like sequela of prior infection with granulomatous changes.  Lingular and middle lobe bronchiectasis concerning for NTM, whether this was before or after developed the bronchiectasis unclear.  Does have some cough.  Productive of some sputum.  Overall dyspnea okay.    Questionaires / Pulmonary Flowsheets:   ACT:      No data to display          MMRC:     No data to display          Epworth:      No data to display          Tests:   FENO:  No results found for: "NITRICOXIDE"  PFT:    Latest Ref Rng & Units 03/07/2022    1:22 PM  PFT Results  FVC-Predicted Pre % 50   FVC-Post L 1.44   FVC-Predicted Post % 51   Pre FEV1/FVC % % 80   Post FEV1/FCV % % 79   FEV1-Pre L 1.13   FEV1-Predicted Pre % 53   FEV1-Post L 1.14   DLCO uncorrected ml/min/mmHg 13.22   DLCO UNC% % 69   DLCO corrected ml/min/mmHg 13.22   DLCO COR %Predicted % 69   DLVA Predicted % 101   TLC L 5.54   TLC % Predicted % 111   RV % Predicted % 183   Personally reviewed and interpreted as air trapping, DLCO moderately reduced  WALK:      No data to display          Imaging: No results found.  Lab Results: Personally reviewed  CBC    Component Value Date/Time   WBC 6.8 11/29/2021 1159   RBC 4.48 11/29/2021 1159   HGB 13.7 11/29/2021 1159   HCT 39.9 11/29/2021 1159   PLT 266.0 11/29/2021 1159   MCV 89.1  11/29/2021 1159   MCH 30.2 02/21/2020 1436   MCHC 34.4 11/29/2021 1159   RDW 13.4 11/29/2021 1159   LYMPHSABS 1.5 11/29/2021 1159   MONOABS 0.8 11/29/2021 1159   EOSABS 0.2 11/29/2021 1159   BASOSABS 0.0 11/29/2021 1159    BMET    Component Value Date/Time   NA 136 02/21/2020 1436   K 4.0 02/21/2020 1436   CL 97 (L) 02/21/2020 1436   CO2 30 02/21/2020 1436   GLUCOSE 87 02/21/2020 1436   BUN 17 02/21/2020 1436   CREATININE 0.69 02/21/2020 1436   CALCIUM 9.9 02/21/2020 1436   GFRNONAA >60 08/19/2017 1148   GFRAA >60 08/19/2017 1148    BNP No results found for: "BNP"  ProBNP No results found for: "PROBNP"  Specialty Problems       Pulmonary Problems   DOE (dyspnea on exertion)    Spirometry 11/29/2017  FEV1 1.4 (56%)  Ratio 73 min curvature        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       Bronchiectasis without complication (HCC)    Quit smoking  2003/MM - CT chest  08/19/17 Consolidative opacities with associated bronchiectasis within the right middle lobe and lingula (representative images 89, series 6). There are additional areas of bronchiectasis within the right upper lobe (representative image 49, series 6) .  -  11/29/2021  quant TB neg  Ig's nl   alpha one   MM level 178 -  HRCT  01/20/22  worse vs priors with bronchiectasis/ MPNs > rec zpak prn flares and consult  Dr Judeth Horn - PFTs 03/07/2022  min airflow obstruction  with 1% resp to saba on Timolol eyedrops            Allergies  Allergen Reactions   Penicillins Rash    unknown   Pneumovax 23 [Pneumococcal Vac Polyvalent] Other (See Comments)    Severe localized reaction w/ flu like symptoms   Nickel Rash    Immunization History  Administered Date(s) Administered   Fluad Quad(high Dose 65+) 11/05/2018, 11/12/2019   Influenza, High Dose Seasonal PF 10/21/2016, 11/29/2017    Influenza,inj,Quad PF,6+ Mos 11/24/2020   Influenza-Unspecified 11/24/2020   PFIZER Comirnaty(Gray Top)Covid-19 Tri-Sucrose Vaccine 08/25/2020   PFIZER(Purple Top)SARS-COV-2 Vaccination 03/14/2019, 04/09/2019   Pneumococcal Conjugate-13 02/12/2014   Td 06/26/2008    Past Medical History:  Diagnosis Date   Anxiety    Cancer (HCC)    basal skin cancer    DJD (degenerative joint disease) of hip    right - Alusio   Dyslipidemia    GERD (gastroesophageal reflux disease)    Glaucoma (increased eye pressure)    Hypertension    Hypothyroidism    Osteopenia    Pneumonia    hx of pneumonia - lst time 1998   PONV (postoperative nausea and vomiting)    Vitreous floaters of left eye 04/2013    Tobacco History: Social History   Tobacco Use  Smoking Status Former   Current packs/day: 0.00   Average packs/day: 0.5 packs/day for 20.0 years (10.0 ttl pk-yrs)   Types: Cigarettes   Start date: 01/17/1981   Quit date: 01/17/2001   Years since quitting: 21.5  Smokeless Tobacco Never   Counseling given: Not Answered   Continue to not smoke  Outpatient Encounter Medications as of 04/25/2022  Medication Sig   aspirin 81 MG tablet Take 81 mg by mouth daily.   azithromycin (ZITHROMAX) 250 MG tablet Take as directed   Calcium Glycerophosphate (PRELIEF PO) Take 1 tablet by mouth daily as needed.   cetirizine (ZYRTEC) 10 MG tablet Take 10 mg by mouth daily as needed for allergies.   cholecalciferol (VITAMIN D3) 25 MCG (1000 UNIT) tablet Take 1,000 Units by mouth daily. 2 x daily   Clobetasol Propionate 0.05 % lotion    fluticasone (FLONASE) 50 MCG/ACT nasal spray Place into both nostrils daily.   furosemide (LASIX) 20 MG tablet TAKE 1 TABLET(20 MG) BY MOUTH DAILY   levothyroxine (SYNTHROID) 100 MCG tablet TAKE 1 TABLET BY MOUTH EVERY DAY BEFORE BREAKFAST   Omega-3 Fatty Acids (FISH OIL) 1000 MG CAPS Take by mouth every other day.   Probiotic CAPS Take 1 capsule by mouth daily.   timolol  (TIMOPTIC) 0.5 % ophthalmic solution 1 drop every morning.   Travoprost, BAK Free, (TRAVATAN) 0.004 % SOLN ophthalmic solution Place  1 drop into both eyes at bedtime.   No facility-administered encounter medications on file as of 04/25/2022.     Review of Systems  Review of Systems  No chest pain exertion pain orthopnea or PND.  Comprehensive review of systems otherwise negative.   Physical Exam  BP 128/70 (BP Location: Left Arm, Patient Position: Sitting, Cuff Size: Normal)   Pulse 74   Ht 5\' 4"  (1.626 m)   Wt 133 lb 3.2 oz (60.4 kg)   LMP 01/17/2001 (Approximate)   SpO2 98%   BMI 22.86 kg/m   Wt Readings from Last 5 Encounters:  04/25/22 133 lb 3.2 oz (60.4 kg)  03/07/22 136 lb (61.7 kg)  11/29/21 136 lb 12.8 oz (62.1 kg)  10/18/21 137 lb 12.8 oz (62.5 kg)  09/15/21 141 lb (64 kg)    BMI Readings from Last 5 Encounters:  04/25/22 22.86 kg/m  03/07/22 23.71 kg/m  11/29/21 22.76 kg/m  10/18/21 23.29 kg/m  09/15/21 23.83 kg/m     Physical Exam General: Sitting in chair, no acute distress Eyes: EOMI, no icterus Neck: Supple, no JVP Pulmonary: Relatively clear, normal work of breathing Cardiovascular warm, no edema   Assessment & Plan:   Bronchiectasis: Unclear etiology, high suspicion, NTM disease.  Overall symptoms okay.  Sputum today for AFB as well as bacterial pathogens.  Begin airway clearance with flutter valve.  Consider addition of vest, hypertonic saline in the future.  I see no role for bronchoscopy currently.   Return in about 3 months (around 07/25/2022) for f/u Dr. Judeth Horn.   Karren Burly, MD 08/16/2022   This appointment required 40 minutes of patient care (this includes precharting, chart review, review of results, face-to-face care, etc.).

## 2022-09-12 DIAGNOSIS — W57XXXA Bitten or stung by nonvenomous insect and other nonvenomous arthropods, initial encounter: Secondary | ICD-10-CM | POA: Diagnosis not present

## 2022-09-12 DIAGNOSIS — S90562A Insect bite (nonvenomous), left ankle, initial encounter: Secondary | ICD-10-CM | POA: Diagnosis not present

## 2022-09-16 NOTE — Progress Notes (Signed)
@Patient  ID: [name], female    DOB: Jun 28, 1950, 73 y.o.   MRN: [id number]  Chief Complaint  Patient presents with   Follow-up    Coughing up blood ( light )     Referring provider: Ladora Daniel, PA-C  HPI:   72 y.o. woman whom we are seeing for evaluation of bronchiectasis.  Here with hemoptysis.  Increased cough worsening sputum.  No reported blood in her medicines.  Seems improved.  Discussed antibiotic therapy.  She wanted to have some on hand.  She had an exacerbation 05/2022.  It seemed to cause some rash that was prescribed.  Prescribed different antibiotic.   Questionaires / Pulmonary Flowsheets:   ACT:      No data to display          MMRC:     No data to display          Epworth:      No data to display          Tests:   FENO:  No results found for: "NITRICOXIDE"  PFT:    Latest Ref Rng & Units 03/07/2022    1:22 PM  PFT Results  FVC-Predicted Pre % 50   FVC-Post L 1.44   FVC-Predicted Post % 51   Pre FEV1/FVC % % 80   Post FEV1/FCV % % 79   FEV1-Pre L 1.13   FEV1-Predicted Pre % 53   FEV1-Post L 1.14   DLCO uncorrected ml/min/mmHg 13.22   DLCO UNC% % 69   DLCO corrected ml/min/mmHg 13.22   DLCO COR %Predicted % 69   DLVA Predicted % 101   TLC L 5.54   TLC % Predicted % 111   RV % Predicted % 183   Personally reviewed and interpreted as air trapping, DLCO moderately reduced  WALK:      No data to display          Imaging: No results found.  Lab Results: Personally reviewed  CBC    Component Value Date/Time   WBC 6.8 11/29/2021 1159   RBC 4.48 11/29/2021 1159   HGB 13.7 11/29/2021 1159   HCT 39.9 11/29/2021 1159   PLT 266.0 11/29/2021 1159   MCV 89.1 11/29/2021 1159   MCH 30.2 02/21/2020 1436   MCHC 34.4 11/29/2021 1159   RDW 13.4 11/29/2021 1159   LYMPHSABS 1.5 11/29/2021 1159   MONOABS 0.8 11/29/2021 1159   EOSABS 0.2 11/29/2021 1159   BASOSABS 0.0 11/29/2021 1159    BMET    Component Value  Date/Time   NA 136 02/21/2020 1436   K 4.0 02/21/2020 1436   CL 97 (L) 02/21/2020 1436   CO2 30 02/21/2020 1436   GLUCOSE 87 02/21/2020 1436   BUN 17 02/21/2020 1436   CREATININE 0.69 02/21/2020 1436   CALCIUM 9.9 02/21/2020 1436   GFRNONAA >60 08/19/2017 1148   GFRAA >60 08/19/2017 1148    BNP No results found for: "BNP"  ProBNP No results found for: "PROBNP"  Specialty Problems       Pulmonary Problems   DOE (dyspnea on exertion)    Spirometry 11/29/2017  FEV1 1.4 (56%)  Ratio 73 min curvature        Right middle lobe syndrome assoc with Lingular dz     S/p FOB 09/18/96 with patent airways  - see CTa 08/20/16 RML/lingular changes as seen also on plain film  .       Bronchiectasis without complication (HCC)    Quit  smoking  2003/MM - CT chest  08/19/17 Consolidative opacities with associated bronchiectasis within the right middle lobe and lingula (representative images 89, series 6). There are additional areas of bronchiectasis within the right upper lobe (representative image 49, series 6) .  -  11/29/2021  quant TB neg  Ig's nl   alpha one   MM level 178 -  HRCT  01/20/22  worse vs priors with bronchiectasis/ MPNs > rec zpak prn flares and consult  Dr Judeth Horn - PFTs 03/07/2022  min airflow obstruction  with 1% resp to saba on Timolol eyedrops            Allergies  Allergen Reactions   Penicillins Rash    unknown   Pneumovax 23 [Pneumococcal Vac Polyvalent] Other (See Comments)    Severe localized reaction w/ flu like symptoms   Nickel Rash    Immunization History  Administered Date(s) Administered   Fluad Quad(high Dose 65+) 11/05/2018, 11/12/2019   Influenza, High Dose Seasonal PF 10/21/2016, 11/29/2017   Influenza,inj,Quad PF,6+ Mos 11/24/2020   Influenza-Unspecified 11/24/2020   PFIZER Comirnaty(Gray Top)Covid-19 Tri-Sucrose Vaccine 08/25/2020   PFIZER(Purple Top)SARS-COV-2 Vaccination 03/14/2019, 04/09/2019   Pneumococcal Conjugate-13 02/12/2014   Td  06/26/2008    Past Medical History:  Diagnosis Date   Anxiety    Cancer (HCC)    basal skin cancer    DJD (degenerative joint disease) of hip    right - Alusio   Dyslipidemia    GERD (gastroesophageal reflux disease)    Glaucoma (increased eye pressure)    Hypertension    Hypothyroidism    Osteopenia    Pneumonia    hx of pneumonia - lst time 1998   PONV (postoperative nausea and vomiting)    Vitreous floaters of left eye 04/2013    Tobacco History: Social History   Tobacco Use  Smoking Status Former   Current packs/day: 0.00   Average packs/day: 0.5 packs/day for 20.0 years (10.0 ttl pk-yrs)   Types: Cigarettes   Start date: 01/17/1981   Quit date: 01/17/2001   Years since quitting: 21.6  Smokeless Tobacco Never   Counseling given: Not Answered   Continue to not smoke  Outpatient Encounter Medications as of 08/16/2022  Medication Sig   aspirin 81 MG tablet Take 81 mg by mouth daily.   azithromycin (ZITHROMAX) 250 MG tablet Take as directed   Calcium Glycerophosphate (PRELIEF PO) Take 1 tablet by mouth daily as needed.   cetirizine (ZYRTEC) 10 MG tablet Take 10 mg by mouth daily as needed for allergies.   cholecalciferol (VITAMIN D3) 25 MCG (1000 UNIT) tablet Take 1,000 Units by mouth daily. 2 x daily   [EXPIRED] doxycycline (VIBRA-TABS) 100 MG tablet Take 1 tablet (100 mg total) by mouth 2 (two) times daily for 10 days.   fluticasone (FLONASE) 50 MCG/ACT nasal spray Place into both nostrils daily.   furosemide (LASIX) 20 MG tablet TAKE 1 TABLET(20 MG) BY MOUTH DAILY   levothyroxine (SYNTHROID) 100 MCG tablet TAKE 1 TABLET BY MOUTH EVERY DAY BEFORE BREAKFAST   Omega-3 Fatty Acids (FISH OIL) 1000 MG CAPS Take by mouth every other day.   Probiotic CAPS Take 1 capsule by mouth daily.   timolol (TIMOPTIC) 0.5 % ophthalmic solution 1 drop every morning.   Travoprost, BAK Free, (TRAVATAN) 0.004 % SOLN ophthalmic solution Place 1 drop into both eyes at bedtime.   No  facility-administered encounter medications on file as of 08/16/2022.     Review of Systems  Review of  Systems  N/a Physical Exam  BP 132/70   Pulse 86   Ht 5\' 4"  (1.626 m)   Wt 127 lb 3.2 oz (57.7 kg)   LMP 01/17/2001 (Approximate)   SpO2 92%   BMI 21.83 kg/m   Wt Readings from Last 5 Encounters:  08/16/22 127 lb 3.2 oz (57.7 kg)  04/25/22 133 lb 3.2 oz (60.4 kg)  03/07/22 136 lb (61.7 kg)  11/29/21 136 lb 12.8 oz (62.1 kg)  10/18/21 137 lb 12.8 oz (62.5 kg)    BMI Readings from Last 5 Encounters:  08/16/22 21.83 kg/m  04/25/22 22.86 kg/m  03/07/22 23.71 kg/m  11/29/21 22.76 kg/m  10/18/21 23.29 kg/m     Physical Exam General: Sitting in chair, no acute distress Eyes: EOMI, no icterus Neck: Supple, no JVP Pulmonary: Relatively clear, normal work of breathing Cardiovascular warm, no edema   Assessment & Plan:   Bronchiectasis with recent exacerbation: Unclear etiology, high suspicion, NTM disease.  Recent exacerbation 05/2022 requiring antibiotics.  With worsening cough mild hemoptysis seems better.  Continue airway clearance with flutter valve.  Prescription for doxycycline to have on hand if cough worsens.  Return in about 3 months (around 11/16/2022) for f/u Dr. Judeth Horn.   Karren Burly, MD 09/16/2022

## 2022-11-21 ENCOUNTER — Ambulatory Visit (INDEPENDENT_AMBULATORY_CARE_PROVIDER_SITE_OTHER): Payer: Medicare Other | Admitting: Pulmonary Disease

## 2022-11-21 ENCOUNTER — Encounter: Payer: Self-pay | Admitting: Pulmonary Disease

## 2022-11-21 VITALS — BP 156/85 | HR 87 | Ht 64.0 in | Wt 126.0 lb

## 2022-11-21 DIAGNOSIS — J9819 Other pulmonary collapse: Secondary | ICD-10-CM | POA: Diagnosis not present

## 2022-11-21 DIAGNOSIS — J479 Bronchiectasis, uncomplicated: Secondary | ICD-10-CM

## 2022-11-21 NOTE — Progress Notes (Signed)
@Patient  ID: [name], female    DOB: November 19, 1950, 72 y.o.   MRN: [id number]  No chief complaint on file. Chief complaint: cough  Referring provider: Ladora Daniel, PA-C  HPI:   72 y.o. woman whom we are seeing for evaluation of bronchiectasis.    Doing ok. Cough at baseline. Using flutter several times a day. Dyspnea is stable. No hemoptysis. None since last visit. Multiple questions answered.    Questionaires / Pulmonary Flowsheets:   ACT:      No data to display          MMRC:     No data to display          Epworth:      No data to display          Tests:   FENO:  No results found for: "NITRICOXIDE"  PFT:    Latest Ref Rng & Units 03/07/2022    1:22 PM  PFT Results  FVC-Predicted Pre % 50   FVC-Post L 1.44   FVC-Predicted Post % 51   Pre FEV1/FVC % % 80   Post FEV1/FCV % % 79   FEV1-Pre L 1.13   FEV1-Predicted Pre % 53   FEV1-Post L 1.14   DLCO uncorrected ml/min/mmHg 13.22   DLCO UNC% % 69   DLCO corrected ml/min/mmHg 13.22   DLCO COR %Predicted % 69   DLVA Predicted % 101   TLC L 5.54   TLC % Predicted % 111   RV % Predicted % 183   Personally reviewed and interpreted as air trapping, DLCO moderately reduced  WALK:      No data to display          Imaging: No results found.  Lab Results: Personally reviewed  CBC    Component Value Date/Time   WBC 6.8 11/29/2021 1159   RBC 4.48 11/29/2021 1159   HGB 13.7 11/29/2021 1159   HCT 39.9 11/29/2021 1159   PLT 266.0 11/29/2021 1159   MCV 89.1 11/29/2021 1159   MCH 30.2 02/21/2020 1436   MCHC 34.4 11/29/2021 1159   RDW 13.4 11/29/2021 1159   LYMPHSABS 1.5 11/29/2021 1159   MONOABS 0.8 11/29/2021 1159   EOSABS 0.2 11/29/2021 1159   BASOSABS 0.0 11/29/2021 1159    BMET    Component Value Date/Time   NA 136 02/21/2020 1436   K 4.0 02/21/2020 1436   CL 97 (L) 02/21/2020 1436   CO2 30 02/21/2020 1436   GLUCOSE 87 02/21/2020 1436   BUN 17 02/21/2020 1436    CREATININE 0.69 02/21/2020 1436   CALCIUM 9.9 02/21/2020 1436   GFRNONAA >60 08/19/2017 1148   GFRAA >60 08/19/2017 1148    BNP No results found for: "BNP"  ProBNP No results found for: "PROBNP"  Specialty Problems       Pulmonary Problems   DOE (dyspnea on exertion)    Spirometry 11/29/2017  FEV1 1.4 (56%)  Ratio 73 min curvature        Right middle lobe syndrome assoc with Lingular dz     S/p FOB 09/18/96 with patent airways  - see CTa 08/20/16 RML/lingular changes as seen also on plain film  .       Bronchiectasis without complication (HCC)    Quit smoking  2003/MM - CT chest  08/19/17 Consolidative opacities with associated bronchiectasis within the right middle lobe and lingula (representative images 89, series 6). There are additional areas of bronchiectasis within the right upper lobe (representative  image 49, series 6) .  -  11/29/2021  quant TB neg  Ig's nl   alpha one   MM level 178 -  HRCT  01/20/22  worse vs priors with bronchiectasis/ MPNs > rec zpak prn flares and consult  Dr Judeth Horn - PFTs 03/07/2022  min airflow obstruction  with 1% resp to saba on Timolol eyedrops            Allergies  Allergen Reactions   Penicillins Rash    unknown   Pneumovax 23 [Pneumococcal Vac Polyvalent] Other (See Comments)    Severe localized reaction w/ flu like symptoms   Nickel Rash    Immunization History  Administered Date(s) Administered   Fluad Quad(high Dose 65+) 11/05/2018, 11/12/2019   Influenza, High Dose Seasonal PF 10/21/2016, 11/29/2017   Influenza,inj,Quad PF,6+ Mos 11/24/2020   Influenza-Unspecified 11/24/2020   PFIZER Comirnaty(Gray Top)Covid-19 Tri-Sucrose Vaccine 08/25/2020   PFIZER(Purple Top)SARS-COV-2 Vaccination 03/14/2019, 04/09/2019   Pneumococcal Conjugate-13 02/12/2014   Td 06/26/2008    Past Medical History:  Diagnosis Date   Anxiety    Cancer (HCC)    basal skin cancer    DJD (degenerative joint disease) of hip    right - Alusio    Dyslipidemia    GERD (gastroesophageal reflux disease)    Glaucoma (increased eye pressure)    Hypertension    Hypothyroidism    Osteopenia    Pneumonia    hx of pneumonia - lst time 1998   PONV (postoperative nausea and vomiting)    Vitreous floaters of left eye 04/2013    Tobacco History: Social History   Tobacco Use  Smoking Status Former   Current packs/day: 0.00   Average packs/day: 0.5 packs/day for 20.0 years (10.0 ttl pk-yrs)   Types: Cigarettes   Start date: 01/17/1981   Quit date: 01/17/2001   Years since quitting: 21.8  Smokeless Tobacco Never   Counseling given: Not Answered   Continue to not smoke  Outpatient Encounter Medications as of 11/21/2022  Medication Sig   aspirin 81 MG tablet Take 81 mg by mouth daily.   Calcium Glycerophosphate (PRELIEF PO) Take 1 tablet by mouth daily as needed.   cetirizine (ZYRTEC) 10 MG tablet Take 10 mg by mouth daily as needed for allergies.   cholecalciferol (VITAMIN D3) 25 MCG (1000 UNIT) tablet Take 1,000 Units by mouth daily. 2 x daily   fluticasone (FLONASE) 50 MCG/ACT nasal spray Place into both nostrils daily.   furosemide (LASIX) 20 MG tablet TAKE 1 TABLET(20 MG) BY MOUTH DAILY   levothyroxine (SYNTHROID) 100 MCG tablet TAKE 1 TABLET BY MOUTH EVERY DAY BEFORE BREAKFAST   Omega-3 Fatty Acids (FISH OIL) 1000 MG CAPS Take by mouth every other day.   Probiotic CAPS Take 1 capsule by mouth daily.   timolol (TIMOPTIC) 0.5 % ophthalmic solution 1 drop every morning.   Travoprost, BAK Free, (TRAVATAN) 0.004 % SOLN ophthalmic solution Place 1 drop into both eyes at bedtime.   [DISCONTINUED] azithromycin (ZITHROMAX) 250 MG tablet Take as directed   No facility-administered encounter medications on file as of 11/21/2022.     Review of Systems  Review of Systems  N/a Physical Exam  BP (!) 156/85   Pulse 87   Ht 5\' 4"  (1.626 m)   Wt 126 lb (57.2 kg)   LMP 01/17/2001 (Approximate)   SpO2 95% Comment: ra  BMI 21.63 kg/m    Wt Readings from Last 5 Encounters:  11/21/22 126 lb (57.2 kg)  08/16/22 127  lb 3.2 oz (57.7 kg)  04/25/22 133 lb 3.2 oz (60.4 kg)  03/07/22 136 lb (61.7 kg)  11/29/21 136 lb 12.8 oz (62.1 kg)    BMI Readings from Last 5 Encounters:  11/21/22 21.63 kg/m  08/16/22 21.83 kg/m  04/25/22 22.86 kg/m  03/07/22 23.71 kg/m  11/29/21 22.76 kg/m     Physical Exam General: Sitting in chair, no acute distress Eyes: EOMI, no icterus Neck: Supple, no JVP Pulmonary: Relatively clear, normal work of breathing Cardiovascular warm, no edema   Assessment & Plan:   Bronchiectasis with recent exacerbation: Unclear etiology, high suspicion, NTM disease.  Recent exacerbation 05/2022 requiring antibiotics.  With worsening cough mild hemoptysis seems better.  Continue airway clearance with flutter valve.  Prescription for doxycycline to have on hand if cough worsens.  Chronic cough: Related to bronchiectases. No seasonal variation to suggest asthma etc. Some nasal congestion. Recommend daily flonase and nightly zyrtec.  Return in about 4 months (around 03/21/2023).   Karren Burly, MD 11/21/2022

## 2022-11-21 NOTE — Patient Instructions (Signed)
Nice to see you again  No changes to medicines  Return to clinic in 4 months or sooner as needed

## 2023-01-25 DIAGNOSIS — J479 Bronchiectasis, uncomplicated: Secondary | ICD-10-CM | POA: Diagnosis not present

## 2023-01-25 DIAGNOSIS — Z23 Encounter for immunization: Secondary | ICD-10-CM | POA: Diagnosis not present

## 2023-01-25 DIAGNOSIS — I10 Essential (primary) hypertension: Secondary | ICD-10-CM | POA: Diagnosis not present

## 2023-01-25 DIAGNOSIS — H4089 Other specified glaucoma: Secondary | ICD-10-CM | POA: Diagnosis not present

## 2023-01-25 DIAGNOSIS — M8589 Other specified disorders of bone density and structure, multiple sites: Secondary | ICD-10-CM | POA: Diagnosis not present

## 2023-01-25 DIAGNOSIS — E039 Hypothyroidism, unspecified: Secondary | ICD-10-CM | POA: Diagnosis not present

## 2023-01-25 DIAGNOSIS — Z130 Encounter for screening for diseases of the blood and blood-forming organs and certain disorders involving the immune mechanism: Secondary | ICD-10-CM | POA: Diagnosis not present

## 2023-01-25 DIAGNOSIS — F330 Major depressive disorder, recurrent, mild: Secondary | ICD-10-CM | POA: Diagnosis not present

## 2023-01-25 DIAGNOSIS — E785 Hyperlipidemia, unspecified: Secondary | ICD-10-CM | POA: Diagnosis not present

## 2023-01-25 DIAGNOSIS — Z0000 Encounter for general adult medical examination without abnormal findings: Secondary | ICD-10-CM | POA: Diagnosis not present

## 2023-02-22 ENCOUNTER — Encounter: Payer: Self-pay | Admitting: Pulmonary Disease

## 2023-03-13 DIAGNOSIS — Z85828 Personal history of other malignant neoplasm of skin: Secondary | ICD-10-CM | POA: Diagnosis not present

## 2023-03-13 DIAGNOSIS — D2261 Melanocytic nevi of right upper limb, including shoulder: Secondary | ICD-10-CM | POA: Diagnosis not present

## 2023-03-13 DIAGNOSIS — L720 Epidermal cyst: Secondary | ICD-10-CM | POA: Diagnosis not present

## 2023-03-13 DIAGNOSIS — L821 Other seborrheic keratosis: Secondary | ICD-10-CM | POA: Diagnosis not present

## 2023-03-15 DIAGNOSIS — E871 Hypo-osmolality and hyponatremia: Secondary | ICD-10-CM | POA: Diagnosis not present

## 2023-03-15 DIAGNOSIS — E039 Hypothyroidism, unspecified: Secondary | ICD-10-CM | POA: Diagnosis not present

## 2023-03-21 ENCOUNTER — Encounter: Payer: Self-pay | Admitting: Pulmonary Disease

## 2023-03-21 ENCOUNTER — Ambulatory Visit: Payer: Medicare Other | Admitting: Pulmonary Disease

## 2023-03-21 VITALS — BP 174/85 | HR 84 | Ht 64.0 in | Wt 121.0 lb

## 2023-03-21 DIAGNOSIS — J479 Bronchiectasis, uncomplicated: Secondary | ICD-10-CM

## 2023-03-21 DIAGNOSIS — Z87891 Personal history of nicotine dependence: Secondary | ICD-10-CM | POA: Diagnosis not present

## 2023-03-21 MED ORDER — DOXYCYCLINE HYCLATE 100 MG PO TABS: 100.0000 mg | ORAL_TABLET | Freq: Two times a day (BID) | ORAL | 0 refills | Status: AC

## 2023-03-21 NOTE — Patient Instructions (Signed)
 No changes today  Message Ladora Daniel PA about the BP and what plans or recommendations she may have  Increase flutter valve use if notice cough worsening  Return to clinic in 6 months or sooner as needed

## 2023-03-21 NOTE — Progress Notes (Signed)
 @Patient  ID: [name], female    DOB: 04-08-1950, 73 y.o.   MRN: [id number]  Chief Complaint  Patient presents with   Follow-up    Pts coughing is borderline w/ phlegm (gray/ yellow). Sob during exertion, pt has been exercising more often and seems to be helping   Chief complaint: cough  Referring provider: Ladora Daniel, PA-C  HPI:   73 y.o. woman whom we are seeing for evaluation of bronchiectasis.    Doing ok. Cough at baseline. Using flutter  valve daily.  Company worse last month.  Used doxycycline.  Helped clean up clearance on the call.  Blood pressure elevated today.  Elevated last week at PCP office 2.  Advised her to discuss with her PCP plan moving forward.  Day-to-day not a huge risk but over time certainly could be.  Questionaires / Pulmonary Flowsheets:   ACT:      No data to display          MMRC:     No data to display          Epworth:      No data to display          Tests:   FENO:  No results found for: "NITRICOXIDE"  PFT:    Latest Ref Rng & Units 03/07/2022    1:22 PM  PFT Results  FVC-Predicted Pre % 50   FVC-Post L 1.44   FVC-Predicted Post % 51   Pre FEV1/FVC % % 80   Post FEV1/FCV % % 79   FEV1-Pre L 1.13   FEV1-Predicted Pre % 53   FEV1-Post L 1.14   DLCO uncorrected ml/min/mmHg 13.22   DLCO UNC% % 69   DLCO corrected ml/min/mmHg 13.22   DLCO COR %Predicted % 69   DLVA Predicted % 101   TLC L 5.54   TLC % Predicted % 111   RV % Predicted % 183   Personally reviewed and interpreted as air trapping, DLCO moderately reduced  WALK:      No data to display          Imaging: No results found.  Lab Results: Personally reviewed  CBC    Component Value Date/Time   WBC 6.8 11/29/2021 1159   RBC 4.48 11/29/2021 1159   HGB 13.7 11/29/2021 1159   HCT 39.9 11/29/2021 1159   PLT 266.0 11/29/2021 1159   MCV 89.1 11/29/2021 1159   MCH 30.2 02/21/2020 1436   MCHC 34.4 11/29/2021 1159   RDW 13.4 11/29/2021  1159   LYMPHSABS 1.5 11/29/2021 1159   MONOABS 0.8 11/29/2021 1159   EOSABS 0.2 11/29/2021 1159   BASOSABS 0.0 11/29/2021 1159    BMET    Component Value Date/Time   NA 136 02/21/2020 1436   K 4.0 02/21/2020 1436   CL 97 (L) 02/21/2020 1436   CO2 30 02/21/2020 1436   GLUCOSE 87 02/21/2020 1436   BUN 17 02/21/2020 1436   CREATININE 0.69 02/21/2020 1436   CALCIUM 9.9 02/21/2020 1436   GFRNONAA >60 08/19/2017 1148   GFRAA >60 08/19/2017 1148    BNP No results found for: "BNP"  ProBNP No results found for: "PROBNP"  Specialty Problems       Pulmonary Problems   DOE (dyspnea on exertion)   Spirometry 11/29/2017  FEV1 1.4 (56%)  Ratio 73 min curvature        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       Bronchiectasis without complication (HCC)   Quit smoking  2003/MM - CT chest  08/19/17 Consolidative opacities with associated bronchiectasis within the right middle lobe and lingula (representative images 89, series 6). There are additional areas of bronchiectasis within the right upper lobe (representative image 49, series 6) .  -  11/29/2021  quant TB neg  Ig's nl   alpha one   MM level 178 -  HRCT  01/20/22  worse vs priors with bronchiectasis/ MPNs > rec zpak prn flares and consult  Dr Judeth Horn - PFTs 03/07/2022  min airflow obstruction  with 1% resp to saba on Timolol eyedrops            Allergies  Allergen Reactions   Penicillins Rash    unknown   Pneumovax 23 [Pneumococcal Vac Polyvalent] Other (See Comments)    Severe localized reaction w/ flu like symptoms   Nickel Rash    Immunization History  Administered Date(s) Administered   Fluad Quad(high Dose 65+) 11/05/2018, 11/12/2019   Influenza, High Dose Seasonal PF 10/21/2016, 11/29/2017   Influenza,inj,Quad PF,6+ Mos 11/24/2020   Influenza-Unspecified 11/24/2020   PFIZER  Comirnaty(Gray Top)Covid-19 Tri-Sucrose Vaccine 08/25/2020   PFIZER(Purple Top)SARS-COV-2 Vaccination 03/14/2019, 04/09/2019   Pneumococcal Conjugate-13 02/12/2014   Td 06/26/2008    Past Medical History:  Diagnosis Date   Anxiety    Cancer (HCC)    basal skin cancer    DJD (degenerative joint disease) of hip    right - Alusio   Dyslipidemia    GERD (gastroesophageal reflux disease)    Glaucoma (increased eye pressure)    Hypertension    Hypothyroidism    Osteopenia    Pneumonia    hx of pneumonia - lst time 1998   PONV (postoperative nausea and vomiting)    Vitreous floaters of left eye 04/2013    Tobacco History: Social History   Tobacco Use  Smoking Status Former   Current packs/day: 0.00   Average packs/day: 0.5 packs/day for 20.0 years (10.0 ttl pk-yrs)   Types: Cigarettes   Start date: 01/17/1981   Quit date: 01/17/2001   Years since quitting: 22.1  Smokeless Tobacco Never   Counseling given: Not Answered   Continue to not smoke  Outpatient Encounter Medications as of 03/21/2023  Medication Sig   aspirin 81 MG tablet Take 81 mg by mouth daily.   Calcium Glycerophosphate (PRELIEF PO) Take 1 tablet by mouth daily as needed.   cetirizine (ZYRTEC) 10 MG tablet Take 10 mg by mouth daily as needed for allergies.   cholecalciferol (VITAMIN D3) 25 MCG (1000 UNIT) tablet Take 1,000 Units by mouth daily. 2 x daily   doxycycline (VIBRA-TABS) 100 MG tablet Take 1 tablet (100 mg total) by mouth 2 (two) times daily for 10 days.   fluticasone (FLONASE) 50 MCG/ACT nasal spray Place into both nostrils daily.   furosemide (LASIX) 20 MG tablet TAKE 1 TABLET(20 MG) BY MOUTH DAILY   levothyroxine (SYNTHROID) 100 MCG tablet TAKE 1 TABLET BY MOUTH EVERY DAY BEFORE BREAKFAST   Omega-3 Fatty Acids (FISH OIL) 1000 MG CAPS Take by mouth every other day.   Probiotic CAPS Take 1 capsule by mouth daily.   timolol (TIMOPTIC) 0.5 % ophthalmic solution 1 drop every morning.   Travoprost, BAK  Free, (TRAVATAN) 0.004 % SOLN ophthalmic solution Place 1 drop into both eyes at bedtime.   No facility-administered encounter medications  on file as of 03/21/2023.     Review of Systems  Review of Systems  N/a Physical Exam  BP (!) 174/85   Pulse 84   Ht 5\' 4"  (1.626 m)   Wt 121 lb (54.9 kg)   LMP 01/17/2001 (Approximate)   SpO2 94%   BMI 20.77 kg/m   Wt Readings from Last 5 Encounters:  03/21/23 121 lb (54.9 kg)  11/21/22 126 lb (57.2 kg)  08/16/22 127 lb 3.2 oz (57.7 kg)  04/25/22 133 lb 3.2 oz (60.4 kg)  03/07/22 136 lb (61.7 kg)    BMI Readings from Last 5 Encounters:  03/21/23 20.77 kg/m  11/21/22 21.63 kg/m  08/16/22 21.83 kg/m  04/25/22 22.86 kg/m  03/07/22 23.71 kg/m     Physical Exam General: Sitting in chair, no acute distress Eyes: EOMI, no icterus Neck: Supple, no JVP Pulmonary: Relatively clear, normal work of breathing Cardiovascular warm, no edema   Assessment & Plan:   Bronchiectasis with recent exacerbation: Unclear etiology, high suspicion, NTM disease.  Significant exacerbation 05/2022 requiring antibiotic with worsening cough mild hemoptysis seems better.  Continue airway clearance with flutter valve, instructed to increase when ill.  Prescription for doxycycline to have on hand if cough worsens.  Chronic cough: Related to bronchiectases. No seasonal variation to suggest asthma etc. Some nasal congestion. Recommend daily flonase and nightly zyrtec.  Return in about 6 months (around 09/21/2023) for f/u Dr. Judeth Horn.   Karren Burly, MD 03/21/2023

## 2023-04-01 DIAGNOSIS — H40003 Preglaucoma, unspecified, bilateral: Secondary | ICD-10-CM | POA: Diagnosis not present

## 2023-04-19 DIAGNOSIS — H524 Presbyopia: Secondary | ICD-10-CM | POA: Diagnosis not present

## 2023-04-19 DIAGNOSIS — H2513 Age-related nuclear cataract, bilateral: Secondary | ICD-10-CM | POA: Diagnosis not present

## 2023-04-19 DIAGNOSIS — H40013 Open angle with borderline findings, low risk, bilateral: Secondary | ICD-10-CM | POA: Diagnosis not present

## 2023-04-21 ENCOUNTER — Telehealth: Payer: Self-pay

## 2023-04-21 NOTE — Telephone Encounter (Signed)
 Scheduled a recall notice for patient for 09/2023 with Dr. Judeth Horn.  Per OV note on 03/21/2023:    Return in about 6 months (around 09/21/2023) for f/u Dr. Judeth Horn.    Copied from CRM [id number]. Topic: General - Other >> Apr 20, 2023  1:55 PM Adele Barthel wrote: Reason for CRM:   Patient called in to schedule her 47-month follow up for 09/2023. She received message through MyChart to call. Advised the recall notice was sent out incorrectly and provider's schedule is not open that far yet.   [facility] is unable to create a new Recall for patients, could another Recall be created so that patient gets notification again in 08/2023 to schedule follow up.

## 2023-05-31 ENCOUNTER — Encounter: Payer: Self-pay | Admitting: Pulmonary Disease

## 2023-06-05 ENCOUNTER — Ambulatory Visit: Payer: Self-pay

## 2023-06-05 NOTE — Telephone Encounter (Signed)
 Copied from CRM [id number]. Topic: Clinical - Red Word Triage >> Jun 05, 2023 10:28 AM Crist Dominion wrote: Red Word that prompted transfer to Nurse Triage: Coughed up  blood over the weekend.  TRIAGE SUMMARY: [name] is being triaged for hemoptysis over the weekend. The patient has a history of Bronchiectasis and has a standing prescription for Doxycycline . The patient reports that is was a one time occurrence and has not occurred since the first time.   If any cancellations occur, the patient asks that she is contacted.   [facility] Pulmonary Triage - Initial Assessment Questions "Chief Complaint (e.g., cough, sob, wheezing, fever, chills, sweat or additional symptoms) *Go to specific symptom protocol after initial questions. Coughing Up Blood  "How long have symptoms been present?" Since this past Saturday  Have you tested for COVID or Flu? Note: If not, ask patient if a home test can be taken. If so, instruct patient to call back for positive results. No  MEDICINES:   "Have you used any OTC meds to help with symptoms?" Yes If yes, ask "What medications?" Vicks Vapor Rub  "Have you used your inhalers/maintenance medication?" No If yes, "What medications?" No Rx'd Inhalers  If inhaler, ask "How many puffs and how often?" Note: Review instructions on medication in the chart. No, inhalers on list  OXYGEN: "Do you wear supplemental oxygen?" No If yes, "How many liters are you supposed to use?"   "Do you monitor your oxygen levels?" No If yes, "What is your reading (oxygen level) today?"    Reason for Disposition  [1] Has underlying lung disease (e.g., COPD, chronic bronchitis or emphysema) AND [2] sputum has turned yellow or green in color  Answer Assessment - Initial Assessment Questions 1. ONSET: "When did the cough begin?"      Saturday  2. SEVERITY: "How bad is the cough today?" "Did the blood appear after a coughing spell?"      Intemittent  3. SPUTUM: "Describe the color of  your sputum" (none, dry cough; clear, white, yellow, green)     Clear to White, sometimes Brown  4. HEMOPTYSIS: "How much blood?" (flecks, streaks, tablespoons, etc.)     Yes, Teaspoon amount, one time  5. DIFFICULTY BREATHING: "Are you having difficulty breathing?" If Yes, ask: "How bad is it?" (e.g., mild, moderate, severe)    - MILD: No SOB at rest, mild SOB with walking, speaks normally in sentences, can lie down, no retractions, pulse < 100.    - MODERATE: SOB at rest, SOB with minimal exertion and prefers to sit, cannot lie down flat, speaks in phrases, mild retractions, audible wheezing, pulse 100-120.    - SEVERE: Very SOB at rest, speaks in single words, struggling to breathe, sitting hunched forward, retractions, pulse > 120      No  6. FEVER: "Do you have a fever?" If Yes, ask: "What is your temperature, how was it measured, and when did it start?"     99, low grade.   7. CARDIAC HISTORY: "Do you have any history of heart disease?" (e.g., heart attack, congestive heart failure)       Hypertension  8. LUNG HISTORY: "Do you have any history of lung disease?"  (e.g., pulmonary embolus, asthma, emphysema)     Bronchiectasis  9. PE RISK FACTORS: "Do you have a history of blood clots?" (or: recent major surgery, recent prolonged travel, bedridden)     No  10. OTHER SYMPTOMS: "Do you have any other symptoms?" (e.g., runny nose, wheezing,  chest pain)       Runny Nose, Wheezing, Swallowing Discomfort, Ear Congestion  11. PREGNANCY: "Is there any chance you are pregnant?" "When was your last menstrual period?"       No and No  12. TRAVEL: "Have you traveled out of the country in the last month?" (e.g., travel history, exposures)       No  Protocols used: Coughing Up Blood-A-AH

## 2023-06-06 NOTE — Telephone Encounter (Signed)
 FYI pt has an appt tomorrow with you

## 2023-06-07 ENCOUNTER — Ambulatory Visit (INDEPENDENT_AMBULATORY_CARE_PROVIDER_SITE_OTHER): Admitting: Pulmonary Disease

## 2023-06-07 ENCOUNTER — Encounter: Payer: Self-pay | Admitting: Pulmonary Disease

## 2023-06-07 VITALS — BP 138/78 | HR 91 | Ht 64.0 in | Wt 120.8 lb

## 2023-06-07 DIAGNOSIS — R0609 Other forms of dyspnea: Secondary | ICD-10-CM | POA: Diagnosis not present

## 2023-06-07 DIAGNOSIS — Z87891 Personal history of nicotine dependence: Secondary | ICD-10-CM

## 2023-06-07 DIAGNOSIS — J471 Bronchiectasis with (acute) exacerbation: Secondary | ICD-10-CM | POA: Diagnosis not present

## 2023-06-07 MED ORDER — MOXIFLOXACIN HCL 400 MG PO TABS: 400.0000 mg | ORAL_TABLET | Freq: Every day | ORAL | 0 refills | Status: DC

## 2023-06-07 MED ORDER — AZELASTINE HCL 0.1 % NA SOLN
2.0000 | Freq: Two times a day (BID) | NASAL | 12 refills | Status: AC
Start: 2023-06-07 — End: ?

## 2023-06-07 MED ORDER — IPRATROPIUM BROMIDE 0.06 % NA SOLN
2.0000 | Freq: Two times a day (BID) | NASAL | 12 refills | Status: AC
Start: 2023-06-07 — End: ?

## 2023-06-07 NOTE — Progress Notes (Addendum)
 @Patient  ID: [name], female    DOB: 08/16/1950, 73 y.o.   MRN: [id number]  Chief Complaint  Patient presents with   Acute Visit    Hemoptysis   Chief complaint: cough  Referring provider: Gerrianne Krauss, PA-C  HPI:   73 y.o. woman whom we are seeing for evaluation of bronchiectasis.  Here for an acute visit.  Messages last week with worsening symptoms.  Started doxycycline  she has on hand.  Over the weekend she had a episode of hemoptysis.  Per triage reports it was a single isolated episode.  No further symptoms.  Patient confirms the same no more over the last 48 hours.  Discussed at length this is pretty common in the setting of bronchiectasis exacerbation.  Overall, her cough is not really improving.  Usually it does with antibiotics.  Only has a couple days left.  Has a lot of sinus congestion as well.  We discussed postnasal drip, treating this as well.  I think this could be making things worse.  Questionaires / Pulmonary Flowsheets:   ACT:      No data to display          MMRC:     No data to display          Epworth:      No data to display          Tests:   FENO:  No results found for: "NITRICOXIDE"  PFT:    Latest Ref Rng & Units 03/07/2022    1:22 PM  PFT Results  FVC-Predicted Pre % 50   FVC-Post L 1.44   FVC-Predicted Post % 51   Pre FEV1/FVC % % 80   Post FEV1/FCV % % 79   FEV1-Pre L 1.13   FEV1-Predicted Pre % 53   FEV1-Post L 1.14   DLCO uncorrected ml/min/mmHg 13.22   DLCO UNC% % 69   DLCO corrected ml/min/mmHg 13.22   DLCO COR %Predicted % 69   DLVA Predicted % 101   TLC L 5.54   TLC % Predicted % 111   RV % Predicted % 183   Personally reviewed and interpreted as air trapping, DLCO moderately reduced  WALK:      No data to display          Imaging: No results found.  Lab Results: Personally reviewed  CBC    Component Value Date/Time   WBC 6.8 11/29/2021 1159   RBC 4.48 11/29/2021 1159   HGB 13.7  11/29/2021 1159   HCT 39.9 11/29/2021 1159   PLT 266.0 11/29/2021 1159   MCV 89.1 11/29/2021 1159   MCH 30.2 02/21/2020 1436   MCHC 34.4 11/29/2021 1159   RDW 13.4 11/29/2021 1159   LYMPHSABS 1.5 11/29/2021 1159   MONOABS 0.8 11/29/2021 1159   EOSABS 0.2 11/29/2021 1159   BASOSABS 0.0 11/29/2021 1159    BMET    Component Value Date/Time   NA 136 02/21/2020 1436   K 4.0 02/21/2020 1436   CL 97 (L) 02/21/2020 1436   CO2 30 02/21/2020 1436   GLUCOSE 87 02/21/2020 1436   BUN 17 02/21/2020 1436   CREATININE 0.69 02/21/2020 1436   CALCIUM 9.9 02/21/2020 1436   GFRNONAA >60 08/19/2017 1148   GFRAA >60 08/19/2017 1148    BNP No results found for: "BNP"  ProBNP No results found for: "PROBNP"  Specialty Problems       Pulmonary Problems   DOE (dyspnea on exertion)   Spirometry 11/29/2017  FEV1 1.4 (56%)  Ratio 73 min curvature        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       Bronchiectasis without complication (HCC)   Quit smoking  2003/MM - CT chest  08/19/17 Consolidative opacities with associated bronchiectasis within the right middle lobe and lingula (representative images 89, series 6). There are additional areas of bronchiectasis within the right upper lobe (representative image 49, series 6) .  -  11/29/2021  quant TB neg  Ig's nl   alpha one   MM level 178 -  HRCT  01/20/22  worse vs priors with bronchiectasis/ MPNs > rec zpak prn flares and consult  Dr Marygrace Snellen - PFTs 03/07/2022  min airflow obstruction  with 1% resp to saba on Timolol eyedrops            Allergies  Allergen Reactions   Penicillins Rash    unknown   Pneumovax 23 [Pneumococcal Vac Polyvalent] Other (See Comments)    Severe localized reaction w/ flu like symptoms   Azithromycin  Rash   Clindamycin/Lincomycin Rash   Nickel Rash    Immunization History  Administered  Date(s) Administered   Fluad Quad(high Dose 65+) 11/05/2018, 11/12/2019   Fluad Trivalent(High Dose 65+) 01/25/2023   Influenza Inj Mdck Quad Pf 11/25/2021   Influenza, High Dose Seasonal PF 10/21/2016, 11/29/2017   Influenza,inj,Quad PF,6+ Mos 11/24/2020   Influenza-Unspecified 11/24/2020   PFIZER Comirnaty(Gray Top)Covid-19 Tri-Sucrose Vaccine 08/25/2020   PFIZER(Purple Top)SARS-COV-2 Vaccination 03/14/2019, 04/09/2019   Pneumococcal Conjugate-13 02/12/2014   Td 06/26/2008   Tdap 03/22/2022    Past Medical History:  Diagnosis Date   Anxiety    Cancer (HCC)    basal skin cancer    DJD (degenerative joint disease) of hip    right - Alusio   Dyslipidemia    GERD (gastroesophageal reflux disease)    Glaucoma (increased eye pressure)    Hypertension    Hypothyroidism    Osteopenia    Pneumonia    hx of pneumonia - lst time 1998   PONV (postoperative nausea and vomiting)    Vitreous floaters of left eye 04/2013    Tobacco History: Social History   Tobacco Use  Smoking Status Former   Current packs/day: 0.00   Average packs/day: 0.5 packs/day for 20.0 years (10.0 ttl pk-yrs)   Types: Cigarettes   Start date: 01/17/1981   Quit date: 01/17/2001   Years since quitting: 22.4  Smokeless Tobacco Never   Counseling given: Not Answered   Continue to not smoke  Outpatient Encounter Medications as of 06/07/2023  Medication Sig   aspirin  81 MG tablet Take 81 mg by mouth daily.   azelastine (ASTELIN) 0.1 % nasal spray Place 2 sprays into both nostrils 2 (two) times daily. Use in each nostril as directed   Calcium Glycerophosphate (PRELIEF PO) Take 1 tablet by mouth daily as needed.   cetirizine (ZYRTEC) 10 MG tablet Take 10 mg by mouth daily as needed for allergies.   cholecalciferol (VITAMIN D3) 25 MCG (1000 UNIT) tablet Take 1,000 Units by mouth daily. 2 x daily   doxycycline  (ADOXA) 100 MG tablet Take 100 mg by mouth 2 (two) times daily.   fluticasone (FLONASE) 50 MCG/ACT nasal  spray Place into both nostrils daily.   furosemide  (LASIX ) 20 MG tablet TAKE 1 TABLET(20 MG) BY MOUTH DAILY  ipratropium (ATROVENT) 0.06 % nasal spray Place 2 sprays into both nostrils in the morning and at bedtime.   levothyroxine  (SYNTHROID ) 100 MCG tablet TAKE 1 TABLET BY MOUTH EVERY DAY BEFORE BREAKFAST   moxifloxacin (AVELOX) 400 MG tablet Take 1 tablet (400 mg total) by mouth daily.   Omega-3 Fatty Acids (FISH OIL) 1000 MG CAPS Take by mouth every other day.   Probiotic CAPS Take 1 capsule by mouth daily.   telmisartan (MICARDIS) 40 MG tablet Take 40 mg by mouth daily.   timolol (TIMOPTIC) 0.5 % ophthalmic solution 1 drop every morning.   Travoprost, BAK Free, (TRAVATAN) 0.004 % SOLN ophthalmic solution Place 1 drop into both eyes at bedtime.   No facility-administered encounter medications on file as of 06/07/2023.     Review of Systems  Review of Systems  N/a Physical Exam  BP 138/78 (BP Location: Left Arm, Patient Position: Sitting, Cuff Size: Normal)   Pulse 91   Ht 5\' 4"  (1.626 m)   Wt 120 lb 12.8 oz (54.8 kg)   LMP 01/17/2001 (Approximate)   SpO2 90%   BMI 20.74 kg/m   Wt Readings from Last 5 Encounters:  06/07/23 120 lb 12.8 oz (54.8 kg)  03/21/23 121 lb (54.9 kg)  11/21/22 126 lb (57.2 kg)  08/16/22 127 lb 3.2 oz (57.7 kg)  04/25/22 133 lb 3.2 oz (60.4 kg)    BMI Readings from Last 5 Encounters:  06/07/23 20.74 kg/m  03/21/23 20.77 kg/m  11/21/22 21.63 kg/m  08/16/22 21.83 kg/m  04/25/22 22.86 kg/m     Physical Exam General: Sitting in chair, no acute distress Eyes: EOMI, no icterus Neck: Supple, no JVP Pulmonary: Relatively clear, normal work of breathing Cardiovascular warm, no edema   Assessment & Plan:   Bronchiectasis with recent exacerbation: Etiology of bronchiectasis not totally clear, high suspicion driven by NTM disease.  Repeat exacerbation 05/2023 with single episode of hemoptysis.  She has had hemoptysis in the past.  Isolated  episodes.  Not chronic or recurrent outside of exacerbations of bronchiectasis.  This is not uncommon with coughing and bronchiectatic changes leading to disruption of capillary membranes.  Unfortunately cough was not significant improvement, finished course of doxycycline , if not getting better next week new prescription for moxifloxacin sent.  Increase CoreValve to 4 times a day.  Subacute cough: Suspect related to bronchiectasis as above.  She also has lot of sinus congestion I do think that some of this could be postnasal drip.  No improvement Flonase in the past.  Azelastine, ipratropium nasal spray new prescriptions today.  2 sprays twice daily.  Assess response to head congestion and see if this helps with cough.   Return in about 3 months (around 09/07/2023) for f/u Dr. Marygrace Snellen.   Guerry Leek, MD 06/07/2023

## 2023-06-07 NOTE — Patient Instructions (Addendum)
 Nice to see you again  Continue and finish the doxycycline   Use azelastine and ipratropium nasal sprays 2 sprays twice a day for the next several days, if getting better can decrease to as needed  I sent in a prescription for moxifloxacin, this is a different antibiotic.  If things are not getting better by early next week I recommend taking this, 1 tablet once a day  Return to clinic in 3 months or sooner as needed with Dr. Marygrace Snellen

## 2023-06-28 ENCOUNTER — Encounter: Payer: Self-pay | Admitting: Pulmonary Disease

## 2023-07-04 MED ORDER — DOXYCYCLINE HYCLATE 100 MG PO TABS: 100.0000 mg | ORAL_TABLET | Freq: Two times a day (BID) | ORAL | 0 refills | Status: AC

## 2023-07-04 NOTE — Telephone Encounter (Signed)
**Note De-identified  [place] Obfuscation** Please advise 

## 2023-08-11 DIAGNOSIS — R34 Anuria and oliguria: Secondary | ICD-10-CM | POA: Diagnosis not present

## 2023-08-11 DIAGNOSIS — R350 Frequency of micturition: Secondary | ICD-10-CM | POA: Diagnosis not present

## 2023-08-29 DIAGNOSIS — I10 Essential (primary) hypertension: Secondary | ICD-10-CM | POA: Diagnosis not present

## 2023-09-04 DIAGNOSIS — H40003 Preglaucoma, unspecified, bilateral: Secondary | ICD-10-CM | POA: Diagnosis not present

## 2023-09-27 ENCOUNTER — Telehealth: Payer: Self-pay

## 2023-09-27 ENCOUNTER — Ambulatory Visit: Admitting: Pulmonary Disease

## 2023-09-27 ENCOUNTER — Encounter: Payer: Self-pay | Admitting: Pulmonary Disease

## 2023-09-27 VITALS — BP 132/64 | HR 96 | Ht 64.0 in | Wt 116.0 lb

## 2023-09-27 DIAGNOSIS — J479 Bronchiectasis, uncomplicated: Secondary | ICD-10-CM | POA: Diagnosis not present

## 2023-09-27 DIAGNOSIS — Z87891 Personal history of nicotine dependence: Secondary | ICD-10-CM | POA: Diagnosis not present

## 2023-09-27 DIAGNOSIS — J471 Bronchiectasis with (acute) exacerbation: Secondary | ICD-10-CM

## 2023-09-27 NOTE — Progress Notes (Signed)
 @Patient  ID: [name], female    DOB: [birth year], 73 y.o.   MRN: [id number]  Chief Complaint  Patient presents with   Medical Management of Chronic Issues    PT states congestion, Rx kinda helps    Chief complaint: cough  Referring provider: Samie Frederick, PA-C  HPI:   73 y.o. woman whom we are seeing for evaluation of bronchiectasis.    Returns for routine follow-up.  Multiple exacerbations in the last several months.  Doing it better right now.  Nasal sprays help with nasal congestion.  We discussed new medication, Brinsupri, for bronchiectasis.  She is eager to try this.  Questionaires / Pulmonary Flowsheets:   ACT:      No data to display          MMRC:     No data to display          Epworth:      No data to display          Tests:   FENO:  No results found for: NITRICOXIDE  PFT:    Latest Ref Rng & Units 03/07/2022    1:22 PM  PFT Results  FVC-Predicted Pre % 50   FVC-Post L 1.44   FVC-Predicted Post % 51   Pre FEV1/FVC % % 80   Post FEV1/FCV % % 79   FEV1-Pre L 1.13   FEV1-Predicted Pre % 53   FEV1-Post L 1.14   DLCO uncorrected ml/min/mmHg 13.22   DLCO UNC% % 69   DLCO corrected ml/min/mmHg 13.22   DLCO COR %Predicted % 69   DLVA Predicted % 101   TLC L 5.54   TLC % Predicted % 111   RV % Predicted % 183   Personally reviewed and interpreted as air trapping, DLCO moderately reduced  WALK:      No data to display          Imaging: No results found.  Lab Results: Personally reviewed  CBC    Component Value Date/Time   WBC 6.8 11/29/2021 1159   RBC 4.48 11/29/2021 1159   HGB 13.7 11/29/2021 1159   HCT 39.9 11/29/2021 1159   PLT 266.0 11/29/2021 1159   MCV 89.1 11/29/2021 1159   MCH 30.2 02/21/2020 1436   MCHC 34.4 11/29/2021 1159   RDW 13.4 11/29/2021 1159   LYMPHSABS 1.5 11/29/2021 1159   MONOABS 0.8 11/29/2021 1159   EOSABS 0.2 11/29/2021 1159   BASOSABS 0.0 11/29/2021 1159    BMET    Component  Value Date/Time   NA 136 02/21/2020 1436   K 4.0 02/21/2020 1436   CL 97 (L) 02/21/2020 1436   CO2 30 02/21/2020 1436   GLUCOSE 87 02/21/2020 1436   BUN 17 02/21/2020 1436   CREATININE 0.69 02/21/2020 1436   CALCIUM 9.9 02/21/2020 1436   GFRNONAA >60 08/19/2017 1148   GFRAA >60 08/19/2017 1148    BNP No results found for: BNP  ProBNP No results found for: PROBNP  Specialty Problems       Pulmonary Problems   DOE (dyspnea on exertion)   Spirometry 11/29/2017  FEV1 1.4 (56%)  Ratio 73 min curvature        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       Bronchiectasis without complication (HCC)   Quit smoking  2003/MM - CT  chest  08/19/17 Consolidative opacities with associated bronchiectasis within the right middle lobe and lingula (representative images 89, series 6). There are additional areas of bronchiectasis within the right upper lobe (representative image 49, series 6) .  -  11/29/2021  quant TB neg  Ig's nl   alpha one   MM level 178 -  HRCT  01/20/22  worse vs priors with bronchiectasis/ MPNs > rec zpak prn flares and consult  Dr Cole - PFTs 03/07/2022  min airflow obstruction  with 1% resp to saba on Timolol eyedrops            Allergies  Allergen Reactions   Penicillins Rash    unknown   Pneumovax 23 [Pneumococcal Vac Polyvalent] Other (See Comments)    Severe localized reaction w/ flu like symptoms   Azithromycin  Rash   Clindamycin/Lincomycin Rash   Nickel Rash    Immunization History  Administered Date(s) Administered   Fluad Quad(high Dose 65+) 11/05/2018, 11/12/2019   Fluad Trivalent(High Dose 65+) 01/25/2023   INFLUENZA, HIGH DOSE SEASONAL PF 10/21/2016, 11/29/2017   Influenza Inj Mdck Quad Pf 11/25/2021   Influenza,inj,Quad PF,6+ Mos 11/24/2020   Influenza-Unspecified 11/24/2020   PFIZER Comirnaty(Gray Top)Covid-19  Tri-Sucrose Vaccine 08/25/2020   PFIZER(Purple Top)SARS-COV-2 Vaccination 03/14/2019, 04/09/2019   Pneumococcal Conjugate-13 02/12/2014   Td 06/26/2008   Tdap 03/22/2022    Past Medical History:  Diagnosis Date   Anxiety    Cancer (HCC)    basal skin cancer    DJD (degenerative joint disease) of hip    right - Alusio   Dyslipidemia    GERD (gastroesophageal reflux disease)    Glaucoma (increased eye pressure)    Hypertension    Hypothyroidism    Osteopenia    Pneumonia    hx of pneumonia - lst time 1998   PONV (postoperative nausea and vomiting)    Vitreous floaters of left eye 04/2013    Tobacco History: Social History   Tobacco Use  Smoking Status Former   Current packs/day: 0.00   Average packs/day: 0.5 packs/day for 20.0 years (10.0 ttl pk-yrs)   Types: Cigarettes   Start date: 01/17/1981   Quit date: 01/17/2001   Years since quitting: 22.7  Smokeless Tobacco Never   Counseling given: Not Answered   Continue to not smoke  Outpatient Encounter Medications as of 09/27/2023  Medication Sig   aspirin  81 MG tablet Take 81 mg by mouth daily.   azelastine  (ASTELIN ) 0.1 % nasal spray Place 2 sprays into both nostrils 2 (two) times daily. Use in each nostril as directed   Calcium Glycerophosphate (PRELIEF PO) Take 1 tablet by mouth daily as needed.   cetirizine (ZYRTEC) 10 MG tablet Take 10 mg by mouth daily as needed for allergies.   cholecalciferol (VITAMIN D3) 25 MCG (1000 UNIT) tablet Take 1,000 Units by mouth daily. 2 x daily   doxycycline  (ADOXA) 100 MG tablet Take 100 mg by mouth 2 (two) times daily.   fluticasone (FLONASE) 50 MCG/ACT nasal spray Place into both nostrils daily.   furosemide  (LASIX ) 20 MG tablet TAKE 1 TABLET(20 MG) BY MOUTH DAILY   ipratropium (ATROVENT ) 0.06 % nasal spray Place 2 sprays into both nostrils in the morning and at bedtime.   levothyroxine  (SYNTHROID ) 100 MCG tablet TAKE 1 TABLET BY MOUTH EVERY DAY BEFORE BREAKFAST   moxifloxacin   (AVELOX ) 400 MG tablet Take 1 tablet (400 mg total) by mouth daily.   Omega-3 Fatty Acids (FISH OIL) 1000 MG CAPS Take by mouth  every other day.   Probiotic CAPS Take 1 capsule by mouth daily.   telmisartan (MICARDIS) 40 MG tablet Take 40 mg by mouth daily.   timolol (TIMOPTIC) 0.5 % ophthalmic solution 1 drop every morning.   Travoprost, BAK Free, (TRAVATAN) 0.004 % SOLN ophthalmic solution Place 1 drop into both eyes at bedtime.   No facility-administered encounter medications on file as of 09/27/2023.     Review of Systems  Review of Systems  N/a Physical Exam  BP 132/64   Pulse 96   Ht 5' 4 (1.626 m)   Wt 116 lb (52.6 kg)   LMP 01/17/2001 (Approximate)   SpO2 91%   BMI 19.91 kg/m   Wt Readings from Last 5 Encounters:  09/27/23 116 lb (52.6 kg)  06/07/23 120 lb 12.8 oz (54.8 kg)  03/21/23 121 lb (54.9 kg)  11/21/22 126 lb (57.2 kg)  08/16/22 127 lb 3.2 oz (57.7 kg)    BMI Readings from Last 5 Encounters:  09/27/23 19.91 kg/m  06/07/23 20.74 kg/m  03/21/23 20.77 kg/m  11/21/22 21.63 kg/m  08/16/22 21.83 kg/m     Physical Exam General: Sitting in chair, no acute distress Eyes: EOMI, no icterus Neck: Supple, no JVP Pulmonary: Relatively clear, normal work of breathing Cardiovascular warm, no edema   Assessment & Plan:   Bronchiectasis with recent exacerbation: Etiology of bronchiectasis not totally clear, high suspicion driven by NTM disease.  Repeat exacerbation 05/2023 with single episode of hemoptysis.  She has had hemoptysis in the past.  Isolated episodes.  Not chronic or recurrent outside of exacerbations of bronchiectasis.  This is not uncommon with coughing and bronchiectatic changes leading to disruption of capillary membranes.  Ongoing exacerbations intermittently responsive to antibiotics.  Continue airway clearance with flutter valve. Paperwork for [employer] today.  Subacute cough: Suspect related to bronchiectasis as above.  She also has lot of  sinus congestion I do think that some of this could be postnasal drip.  No improvement Flonase in the past.  Interval improvement with azelastine  and ipratropium nasal sprays.  Okay to back off to once a day every day and add evening dose as needed.   Return in about 3 months (around 12/27/2023) for f/u Dr. Annella.   Madeline JONELLE Annella, MD 09/27/2023

## 2023-09-27 NOTE — Telephone Encounter (Signed)
 Brinsupri paperwork handed into pharmacy

## 2023-09-27 NOTE — Patient Instructions (Signed)
 Back to see you again  No change in the medication  Paperwork today for Brinsupri -new medication for bronchiectasis to help reduce exacerbations or need for antibiotics.  It works on inflammation and reducing mucus production.  Return to clinic in 3 months or sooner as needed with Dr. Annella

## 2023-09-29 ENCOUNTER — Telehealth: Payer: Self-pay

## 2023-09-29 ENCOUNTER — Other Ambulatory Visit (HOSPITAL_COMMUNITY): Payer: Self-pay

## 2023-09-29 NOTE — Telephone Encounter (Signed)
 Submitted a Prior Authorization request to [facility] for BRINSUPRI via CoverMyMeds. Will update once we receive a response.  Key: [id number]

## 2023-09-29 NOTE — Telephone Encounter (Signed)
 Received notification from [facility] regarding a prior authorization for BRINSUPRI. Authorization has been APPROVED from 07/01/23 to 09/28/24. Approval letter sent to scan center.  Per test claim, copay for 30 days supply is $1259.48  Authorization # [id number] Phone # [phone]  Will proceed with submitting patient assistance application.

## 2023-09-29 NOTE — Telephone Encounter (Signed)
 Received Brinsupri new start paperwork. Completed forms and faxed with clinicals, insurance cards, and prior authorization approval letter to [employer].  Phone #: [phone] Fax #: [phone]

## 2023-10-02 NOTE — Telephone Encounter (Signed)
 Received fax from [facility]. They have received Brinsupri referral. They will contact patient to explain services. BIV takes 24 hours and they will contact office if assistance is needed from [employer].  Phone: [phone]

## 2023-10-05 ENCOUNTER — Encounter: Payer: Self-pay | Admitting: Pulmonary Disease

## 2023-10-06 ENCOUNTER — Telehealth: Payer: Self-pay

## 2023-10-06 ENCOUNTER — Other Ambulatory Visit: Payer: Self-pay

## 2023-10-06 MED ORDER — DOXYCYCLINE HYCLATE 100 MG PO TABS
100.0000 mg | ORAL_TABLET | Freq: Two times a day (BID) | ORAL | 0 refills | Status: DC
Start: 2023-10-06 — End: 2023-10-28

## 2023-10-06 NOTE — Telephone Encounter (Signed)
 LVM with pt, [facility] has been trying to get in touch with her. The pharmacy stated they are trying to enroll her in a grant to help with the Brinsupri copay. Pt can call [facility] at [phone] to apply for the grant and set up shipment.

## 2023-10-06 NOTE — Telephone Encounter (Signed)
 Spoke w/ PT [facility].   Rx sent to pharmacy   -NFN

## 2023-10-06 NOTE — Telephone Encounter (Signed)
 I am not aware of any interaction between the moxifloxacin  and the furosemide  or telmisartan.   Given side effects to other antibiotics, I am not aware of a different antibiotic with similar spectrum of coverage as the moxifloxacin .  Please send doxycycline  100 mg tablet twice a day for 14 days, quantity 28, refill 0.  Pharmacy colleagues - can we look into co-pay and possible manufacture assistance for the Brinsupri?   Thank you!

## 2023-10-09 NOTE — Telephone Encounter (Signed)
 Spoke with patient she has reached out to [facility] everything is in order. -NFN

## 2023-10-09 NOTE — Telephone Encounter (Signed)
 Spoke with patient she has reached out to [facility] everything is in order. -NFN    Dr. Annella   patient would like to know if she can do sputum culture, since she is coughing up a bunch of it and see if there is something else you can Rx ( she has the specimen cups on hand- I will request that she write name / DOB / provider on them before turning them in ).

## 2023-10-09 NOTE — Telephone Encounter (Signed)
 Received call from Lupita PRO with Brinsupri, to inform that the pharmacy has been unable to get in touch with patient to coordinate shipping. Per separate thread, appears patient contacted pharmacy this morning and everything is set.   Aleck Puls, PharmD, BCPS Clinical Pharmacist  [facility]

## 2023-10-10 NOTE — Telephone Encounter (Signed)
 Brinsupri dispensed 10/02/23 by [facility] per available prescription dispense history.   Aleck Puls, PharmD, BCPS Clinical Pharmacist  [facility]

## 2023-10-16 ENCOUNTER — Telehealth: Payer: Self-pay | Admitting: *Deleted

## 2023-10-16 ENCOUNTER — Other Ambulatory Visit: Payer: Self-pay

## 2023-10-16 MED ORDER — CEFDINIR 300 MG PO CAPS
300.0000 mg | ORAL_CAPSULE | Freq: Two times a day (BID) | ORAL | 0 refills | Status: AC
Start: 2023-10-16 — End: 2023-10-26

## 2023-10-16 NOTE — Telephone Encounter (Signed)
 Not sure what medication this is for, are you able to handle this Burnard?

## 2023-10-16 NOTE — Telephone Encounter (Signed)
 Resolved: Kijania-Swaringen, Kelly R, CMA    10/16/23  3:52 PM Note Rx sent to pharmacy patient notified [facility].       -NFN     Wert, Michael B, MD to Kijania-Swaringen, Kelly R, [facility]     10/16/23  3:46 PM Omnicef 300 mg twice daily x 10 days  and off doxy as causing nausea and mucus still dark / hold the shots and Bronchiectasis drug until has a better baseline  Armand Burnard SAUNDERS, CMA to Darlean Ozell NOVAK, MD     10/16/23  3:29 PM Please advise - pharmacy suggested that she get her flu & rsv shots being starting Rx of Brinsupri ,is it okay to get both shot at the same time and the doxy that was Rx is making her sick

## 2023-10-16 NOTE — Telephone Encounter (Signed)
 I got it thank you!

## 2023-10-16 NOTE — Telephone Encounter (Signed)
 Rx sent to pharmacy patient notified VBU.    -NFN

## 2023-10-16 NOTE — Telephone Encounter (Signed)
 Copied from CRM #[id number]. Topic: Clinical - Medical Advice >> Oct 13, 2023 10:17 AM Isabell A wrote: Reason for CRM: Patient is requesting a call back from [place] in regard to questions for the prescription she is going to be prescribed - states Burnard is aware of what's going on.   Callback number: [phone]

## 2023-10-20 NOTE — Telephone Encounter (Signed)
 Copied from CRM #[id number]. Topic: Clinical - Prescription Issue >> Oct 20, 2023  9:29 AM Benton KIDD wrote: Reason for CRM: mr jasper from [employer] received a referral for patient about the brinsupri . Prior authorization was approved but not able to get in touch with patient . Mr montgomery is wanting patient to know this information to get the medication. Please reach out to patient concerning the medication that has been approved. The referral was sent over on 9/12 from dr hunsucker . The patient can call montgomery or the pharmacy [facility] to start the medication  montgomery [phone] ext [phone] [facility] [phone]   Spoke with patient and she was saying that she was on a antibiotic for being sick and she was waiting to get the flu shot and rsv shot and that is why she hasn't started the medication but she will reach out to them and let them know as well

## 2023-10-26 ENCOUNTER — Ambulatory Visit: Admitting: Internal Medicine

## 2023-10-26 ENCOUNTER — Ambulatory Visit: Payer: Self-pay | Admitting: Pulmonary Disease

## 2023-10-26 NOTE — Telephone Encounter (Signed)
 FYI Only or Action Required?: FYI only for provider.  Patient is followed in Pulmonology for bronchiectasis, last seen on 09/27/2023 by Hunsucker, Donnice SAUNDERS, MD.  Called Nurse Triage reporting Medication Problem and Cough.  Symptoms began several days ago.  Interventions attempted: Prescription medications: cefdinir.  Symptoms are: returning.  Triage Disposition: See Physician Within 24 Hours  Patient/caregiver understands and will follow disposition?: Yes         Copied from CRM [id number]. Topic: Clinical - Medication Question >> Oct 25, 2023 10:18 AM Russell PARAS wrote: Reason for CRM:   Pt is contacting clinic to speak with nurse, has questions concerning an antibiotic and her prescribed Brinsupri. Did not give any further information  Requested call back  CB#  [phone] >> Oct 26, 2023  9:21 AM Isabell A wrote: Patient has questions in regard to her medication Cefdinir. Reason for Disposition  [1] Known COPD or other severe lung disease (i.e., bronchiectasis, cystic fibrosis, lung surgery) AND [2] symptoms getting worse (i.e., increased sputum purulence or amount, increased breathing difficulty    Pt currently finishing abx, cefdinir, and initially had sx improvement, but now experiencing colored mucus again/return of sx. Scheduled at alternate [facility].  Answer Assessment - Initial Assessment Questions E2C2 Pulmonary Triage - Initial Assessment Questions Chief Complaint (e.g., cough, sob, wheezing, fever, chills, sweat or additional symptoms) *Go to specific symptom protocol after initial questions. Returning productive cough - has 1 more day of cefdinir.  How long have symptoms been present? A few weeks  Have you tested for COVID or Flu? Note: If not, ask patient if a home test can be taken. If so, instruct patient to call back for positive results. No  MEDICINES:   Have you used any OTC meds to help with symptoms? No If yes, ask What  medications? N/a  Have you used your inhalers/maintenance medication? No If yes, What medications? N/a  If inhaler, ask How many puffs and how often? Note: Review instructions on medication in the chart. N/a  OXYGEN: Do you wear supplemental oxygen? No If yes, How many liters are you supposed to use? N/a  Do you monitor your oxygen levels? No If yes, What is your reading (oxygen level) today? N/a  What is your usual oxygen saturation reading?  (Note: Pulmonary O2 sats should be 90% or greater) N/a     1. ONSET: When did the cough begin?      See above 2. SEVERITY: How bad is the cough today?      See above 3. SPUTUM: Describe the color of your sputum (e.g., none, dry cough; clear, white, yellow, green)     Dark yellow/brown 4. HEMOPTYSIS: Are you coughing up any blood? If Yes, ask: How much? (e.g., flecks, streaks, tablespoons, etc.)     denies 5. DIFFICULTY BREATHING: Are you having difficulty breathing? If Yes, ask: How bad is it? (e.g., mild, moderate, severe)      denies 6. FEVER: Do you have a fever? If Yes, ask: What is your temperature, how was it measured, and when did it start?     Low grade 7. CARDIAC HISTORY: Do you have any history of heart disease? (e.g., heart attack, congestive heart failure)      denies 8. LUNG HISTORY: Do you have any history of lung disease?  (e.g., pulmonary embolus, asthma, emphysema)     bronchiectasis 9. PE RISK FACTORS: Do you have a history of blood clots? (or: recent major surgery, recent prolonged travel, bedridden)  denies 10. OTHER SYMPTOMS: Do you have any other symptoms? (e.g., runny nose, wheezing, chest pain)       Runny nose. 11. PREGNANCY: Is there any chance you are pregnant? When was your last menstrual period?       N/a 12. TRAVEL: Have you traveled out of the country in the last month? (e.g., travel history, exposures)       N/a  Answer Assessment - Initial  Assessment Questions 1. NAME of MEDICINE: What medicine(s) are you calling about?     doxycycline  (VIBRA -TABS) 100 MG tablet 2. QUESTION: What is your question? (e.g., double dose of medicine, side effect)     Reports abd pain as Side Effects, so Dr Neomi prescribed cefdinir instead 3. PRESCRIBER: Who prescribed the medicine? Reason: if prescribed by specialist, call should be referred to that group.     [facility] 4. SYMPTOMS: Do you have any symptoms? If Yes, ask: What symptoms are you having?  How bad are the symptoms (e.g., mild, moderate, severe)     Coughing has come back, and now running low grade fever in the PM 5. PREGNANCY:  Is there any chance that you are pregnant? When was your last menstrual period?     N/a  Protocols used: Medication Question Call-A-AH, Cough - Acute Productive-A-AH

## 2023-10-27 ENCOUNTER — Ambulatory Visit (INDEPENDENT_AMBULATORY_CARE_PROVIDER_SITE_OTHER): Admitting: Internal Medicine

## 2023-10-27 ENCOUNTER — Ambulatory Visit (HOSPITAL_COMMUNITY)
Admission: RE | Admit: 2023-10-27 | Discharge: 2023-10-27 | Disposition: A | Discharge status: Home / Self Care | Source: Ambulatory Visit | Attending: Internal Medicine | Admitting: Internal Medicine

## 2023-10-27 ENCOUNTER — Encounter: Payer: Self-pay | Admitting: Internal Medicine

## 2023-10-27 VITALS — BP 160/82 | HR 87 | Ht 64.0 in | Wt 116.4 lb

## 2023-10-27 DIAGNOSIS — J479 Bronchiectasis, uncomplicated: Secondary | ICD-10-CM | POA: Diagnosis not present

## 2023-10-27 DIAGNOSIS — R059 Cough, unspecified: Secondary | ICD-10-CM | POA: Diagnosis not present

## 2023-10-27 DIAGNOSIS — Z23 Encounter for immunization: Secondary | ICD-10-CM | POA: Diagnosis not present

## 2023-10-27 DIAGNOSIS — R918 Other nonspecific abnormal finding of lung field: Secondary | ICD-10-CM | POA: Diagnosis not present

## 2023-10-27 MED ORDER — LEVOFLOXACIN 500 MG PO TABS: 500.0000 mg | ORAL_TABLET | Freq: Every day | ORAL | 0 refills | Status: AC

## 2023-10-27 NOTE — Patient Instructions (Addendum)
 Levaquin  500 mg daily x 7 days if the mucus gets nasty again (to have on hand - very similar to moxifloxacin  which you have concerns about and should discuss with Dr Annella    Please remember to go to the  x-ray department  @  [facility] for your tests - we will call you with the results when they are available     Follow up in this clinic as needed

## 2023-10-27 NOTE — Progress Notes (Signed)
 Subjective:   Patient ID: [name], female    DOB: 04/27/1950    MRN: [id number]  Brief patient profile:  71  yowf  quit smoking 2003/MM  with h/o HBP and ? rml syndrome   History of Present Illness  June 26, 2008 cpx only c/o = hoarseness and indigestion off and on for up to sev years, worse for several months, better with tums and not eating as much late in evening. no cough or sob over baseline rec trial of diet only to reduce likelihood of gerd       10/18/2021  Re-establish  ov/Aarin Bluett re: RML/lingular syndrome s/p Pna 08/2021 90% back to baseline  Chief Complaint  Patient presents with   Pulmonary Consult    Had PNA 09/10/21- started with hemoptysis and also had fever- tx with pred and zpack. She still has some cough- prod with clear sputum.   Dyspnea:  no change ex tol vs baseline  Cough: slt yellow/ no more blood  Sleeping: flat bed/ two pillows  SABA use: none  02: none  Covid status:  vax x 3  Rec  No change rx   11/29/2021  f/u ov/Jayquan Bradsher re: bronchiectasis    maint on no rx   Chief Complaint  Patient presents with   Follow-up    Discuss CXR .  Cough persistent.  Dyspnea:  back near baseline ex tol Cough: no change from normal = slt yellowish Sleeping: flat bed 2 pillows no resp cc / no consistent am flares  SABA use: none  02: none   Rec Please remember to go to the lab and x-ray department  for your tests - we will call you with the results when they are available. Please schedule a follow up visit in 3 months but call sooner if needed  with  PFTs on return   03/07/2022  f/u ov/Alezander Dimaano re: bronchiectasis maint on no resp rx   Chief Complaint  Patient presents with   Follow-up    Discuss PFT from today.  Did not take Zpak.  Doing well.   Dyspnea:  not limited but not aerobic  Cough: mostly white, sometimes slt yellow  Sleeping: no resp cc  SABA use: none  02: none Rec Consider changing timoptic to Betoptic before we add any beta activators (discuss with  eye doctor next visit)  Keep appt Hunsucker and follow up as directed by him     10/27/2023  ACUTE ov/[place] [facility]/Melven Stockard re: worse cough x 6 weeks first took doxy x over a week then nausea /stopped it and nausea reslved but mucus till brown so started omicief  on Oct 18 2023 and last day should be  10/27/2023   but in meantime on Oct 6 noted more dark mucus using flutter valve  Chief Complaint  Patient presents with   Acute Visit    Cough - dark mucus - reaction to antibx    Dyspnea:  better ex tolerance  Cough: was not dark this am  but intermittently has been even on omnicef  Sleeping: bed is flat / 2 pillows better some noct     SABA use: none  02: none     No obvious day to day or daytime variability or assoc excess/ purulent sputum or mucus plugs or hemoptysis or cp or chest tightness, subjective wheeze or overt sinus or hb symptoms.    Also denies any obvious fluctuation of symptoms with weather or environmental changes or other aggravating or alleviating factors except  as outlined above   No unusual exposure hx or h/o childhood pna/ asthma or knowledge of premature birth.  Current Allergies, Complete Past Medical History, Past Surgical History, Family History, and Social History were reviewed in [employer] record.  ROS  The following are not active complaints unless bolded Hoarseness, sore throat, dysphagia, dental problems, itching, sneezing,  nasal congestion or discharge of excess mucus or purulent secretions, ear ache,   fever, chills, sweats, unintended wt loss or wt gain, classically pleuritic or exertional cp,  orthopnea pnd or arm/hand swelling  or leg swelling, presyncope, palpitations, abdominal pain, anorexia, nausea, vomiting, diarrhea  or change in bowel habits or change in bladder habits, change in stools or change in urine, dysuria, hematuria,  rash, arthralgias, visual complaints, headache, numbness, weakness or ataxia or problems with  walking or coordination,  change in mood or  memory.        Current Meds  Medication Sig   aspirin  81 MG tablet Take 81 mg by mouth daily.   azelastine  (ASTELIN ) 0.1 % nasal spray Place 2 sprays into both nostrils 2 (two) times daily. Use in each nostril as directed   cetirizine (ZYRTEC) 10 MG tablet Take 10 mg by mouth daily as needed for allergies.   cholecalciferol (VITAMIN D3) 25 MCG (1000 UNIT) tablet Take 1,000 Units by mouth daily. 2 x daily   furosemide  (LASIX ) 20 MG tablet TAKE 1 TABLET(20 MG) BY MOUTH DAILY   ipratropium (ATROVENT ) 0.06 % nasal spray Place 2 sprays into both nostrils in the morning and at bedtime.   levothyroxine  (SYNTHROID ) 100 MCG tablet TAKE 1 TABLET BY MOUTH EVERY DAY BEFORE BREAKFAST   moxifloxacin  (AVELOX ) 400 MG tablet Take 1 tablet (400 mg total) by mouth daily.   Omega-3 Fatty Acids (FISH OIL) 1000 MG CAPS Take by mouth every other day.   Probiotic CAPS Take 1 capsule by mouth daily.   telmisartan (MICARDIS) 40 MG tablet Take 40 mg by mouth daily.   timolol (TIMOPTIC) 0.5 % ophthalmic solution 1 drop every morning.   Travoprost, BAK Free, (TRAVATAN) 0.004 % SOLN ophthalmic solution Place 1 drop into both eyes at bedtime.            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/1996  DJD R Hip...............................................................................................SABRA   Alusio  HEALTH MAINTENANCE........................................................................SABRA Referred to IM 11/29/2017  - GYN = [facility]  - Colonoscopy neg 04/23/2004  - Td 06/2008  - Pneumovax 07/2004  Severe localized reaction/ prevnar 13 02/12/2014       Family History:  allergies in mother   lymphoma in mother  asthma in brother 4 years older only sibling  colon ca brother  Prostate ca same brother neg cva/aneurysms  IHD Father onset 3s  IPF in brother 46 y older   Social History:  Quit smokng Jan 2003   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       Objective:   Physical Exam   Wts  10/27/2023     116   03/07/2022       136  11/29/2021     136   10/18/21 137 lb 12.8 oz (62.5 kg)  09/15/21 141 lb (64 kg)  08/18/20 151 lb  12.8 oz (68.9 kg)   11/29/2017    157  11/30/2011     159   August 2, [id number]  June 26, 2008  158   Vital signs reviewed  10/27/2023  - Note at rest 02 sats  90% on RA   General appearance:    amb anxious chronically ill appearing wf nad   HEENT : Oropharynx  clear      NECK :  without  apparent JVD/ palpable Nodes/TM    LUNGS: no acc muscle use,  Min barrel  contour chest wall with bilateral insp /exp rhonchi  and  without cough on insp or exp maneuvers and min  Hyperresonant  to  percussion bilaterally    CV:  RRR  no s3 or murmur or increase in P2, and no edema   ABD:  soft and nontender    MS:  Nl gait/ ext warm without deformities Or obvious joint restrictions  calf tenderness, cyanosis or clubbing     SKIN: warm and dry without lesions    NEURO:  alert, approp, nl sensorium with  no motor or cerebellar deficits apparent.             CXR PA and Lateral:   10/27/2023 :    I personally reviewed images and impression is as follows:    Progressive scarring c/w bronchiectasis     Assessment & Plan:   Assessment & Plan Bronchiectasis without complication (HCC) Quit smoking  2003/MM - CT chest  08/19/17 Consolidative opacities with associated bronchiectasis within the right middle lobe and lingula (representative images 89, series 6). There are additional areas of bronchiectasis within the right upper lobe (representative image 49, series 6) .  -  11/29/2021  quant TB neg  Ig's nl   alpha one   MM level 178 -  HRCT  01/20/22  worse vs  priors with bronchiectasis/ MPNs > rec zpak prn flares and consult  Dr Annella - PFTs 03/07/2022  min airflow obstruction  with 1% resp to saba on Timolol eyedrops  - 10/27/2023 cxr has progressed >> added levauin 500 x 7 d and defer all future rx to Dr Annella  Agree with plans to add more aggressive rx but she is very anxious about any new  rx I did not approve  - I assured her she is in excellent hands with Dr Annella and there may be a need to refer her to [facility] at some point and if so I would concur with that as well as she really has lost ground since prior ov with me.   F/u in my clinic will be prn.  Discussed in detail all the  indications, usual  risks and alternatives  relative to the benefits with patient who agrees to proceed with Rx as outlined.          Each maintenance medication was reviewed in detail including emphasizing most importantly the difference between maintenance and prns and under what circumstances the prns are to be triggered using an action plan format where appropriate.  Total time for H and P, chart review, counseling,   and generating customized AVS unique to this office visit / same day charting = 31 min with pt not seen in  > 1.5 y with refractory symptoms and dz progression.         AVS  Patient Instructions  Levaquin  500 mg daily x 7 days if the mucus gets nasty again (to have on hand - very similar to  moxifloxacin  which you have concerns about and should discuss with Dr Annella    Please remember to go to the  x-ray department  @  [facility] for your tests - we will call you with the results when they are available     Follow up in this clinic as needed      Ozell America, MD 10/28/2023

## 2023-10-28 NOTE — Assessment & Plan Note (Addendum)
 Quit smoking  2003/MM - CT chest  08/19/17 Consolidative opacities with associated bronchiectasis within the right middle lobe and lingula (representative images 89, series 6). There are additional areas of bronchiectasis within the right upper lobe (representative image 49, series 6) .  -  11/29/2021  quant TB neg  Ig's nl   alpha one   MM level 178 -  HRCT  01/20/22  worse vs priors with bronchiectasis/ MPNs > rec zpak prn flares and consult  Dr Annella - PFTs 03/07/2022  min airflow obstruction  with 1% resp to saba on Timolol eyedrops  - 10/27/2023 cxr has progressed >> added levauin 500 x 7 d and defer all future rx to Dr Annella  Agree with plans to add more aggressive rx but she is very anxious about any new  rx I did not approve  - I assured her she is in excellent hands with Dr Annella and there may be a need to refer her to [facility] at some point and if so I would concur with that as well as she really has lost ground since prior ov with me.   F/u in my clinic will be prn.  Discussed in detail all the  indications, usual  risks and alternatives  relative to the benefits with patient who agrees to proceed with Rx as outlined.          Each maintenance medication was reviewed in detail including emphasizing most importantly the difference between maintenance and prns and under what circumstances the prns are to be triggered using an action plan format where appropriate.  Total time for H and P, chart review, counseling,   and generating customized AVS unique to this office visit / same day charting = 31 min with pt not seen in  > 1.5 y with refractory symptoms and dz progression.

## 2023-10-31 NOTE — Telephone Encounter (Signed)
ATC x1.  LVM to return call. 

## 2023-11-01 ENCOUNTER — Telehealth: Payer: Self-pay | Admitting: *Deleted

## 2023-11-01 NOTE — Telephone Encounter (Signed)
 Chest x-ray shows stable chronic changes related to bronchiectasis.  It is okay to take Brinsupri with antibiotics.

## 2023-11-01 NOTE — Telephone Encounter (Signed)
 Patient would like results of cxr ordered by Dr. Darlean 10/10 visit. Also, patient would like to know if  when she starts Brinsupri, if it's ok to be on abx? She has not started abx yet.

## 2023-11-02 ENCOUNTER — Telehealth: Payer: Self-pay

## 2023-11-02 NOTE — Telephone Encounter (Signed)
 Already handled this morning

## 2023-11-02 NOTE — Telephone Encounter (Signed)
 Patient notified

## 2023-11-02 NOTE — Telephone Encounter (Signed)
 Copied from CRM #[id number]. Topic: General - Other >> Nov 01, 2023 11:52 AM [name] wrote: Reason for CRM: Patient returning [facility] phone call, contacted [facility] spoke with Thersia stated only to send urgent calls and a f/u call would be made to the patient. Patient was thankful and verbalized understanding.  Patient notified this morning

## 2023-11-06 DIAGNOSIS — I10 Essential (primary) hypertension: Secondary | ICD-10-CM | POA: Diagnosis not present

## 2023-11-06 DIAGNOSIS — J479 Bronchiectasis, uncomplicated: Secondary | ICD-10-CM | POA: Diagnosis not present

## 2023-11-24 DIAGNOSIS — H40003 Preglaucoma, unspecified, bilateral: Secondary | ICD-10-CM | POA: Diagnosis not present

## 2023-12-27 ENCOUNTER — Ambulatory Visit: Admitting: Pulmonary Disease

## 2023-12-27 ENCOUNTER — Encounter: Payer: Self-pay | Admitting: Pulmonary Disease

## 2023-12-27 VITALS — BP 175/93 | HR 93 | Temp 97.6°F | Ht 64.0 in | Wt 118.0 lb

## 2023-12-27 DIAGNOSIS — J479 Bronchiectasis, uncomplicated: Secondary | ICD-10-CM

## 2023-12-27 DIAGNOSIS — R053 Chronic cough: Secondary | ICD-10-CM

## 2023-12-27 DIAGNOSIS — Z87891 Personal history of nicotine dependence: Secondary | ICD-10-CM

## 2023-12-27 NOTE — Patient Instructions (Signed)
 Nice to see you again  No changes to medication today  If we can get the sputum and off to the lab in the next several weeks or after the new year that will be helpful, if not we can talk about alternatives  If your blood pressure remains elevated, top number above 140 or bottom number above 90 consistently, I would let your primary care doctor know to see if anything needs to be changed.  It could be related to new medication, Brinsupri.  Return to clinic in 3 months or sooner as needed with Dr. Annella

## 2023-12-29 NOTE — Progress Notes (Signed)
 @Patient  ID: [name], female    DOB: 03-21-1950, 73 y.o.   MRN: [id number]  Chief Complaint  Patient presents with   Cough    SOB with exertion, occ wheezing and prod cough with white to dark yellow sputum.     Referring provider: Samie Frederick, PA-C  HPI:   73 y.o. woman whom we are seeing for evaluation of bronchiectasis.  Most recent pulmonary office note reviewed.  Several telephone encounters from our office and interim reviewed.  Returns for follow-up.  Ongoing exacerbation.  Doing little better now.  Antibiotics again 07/2023.  Cough overall worse.  Stays on Brinsupri.  Blood pressure has been elevated.  Questionaires / Pulmonary Flowsheets:   ACT:      No data to display          MMRC:     No data to display          Epworth:      No data to display          Tests:   FENO:  No results found for: NITRICOXIDE  PFT:    Latest Ref Rng & Units 03/07/2022    1:22 PM  PFT Results  FVC-Predicted Pre % 50   FVC-Post L 1.44   FVC-Predicted Post % 51   Pre FEV1/FVC % % 80   Post FEV1/FCV % % 79   FEV1-Pre L 1.13   FEV1-Predicted Pre % 53   FEV1-Post L 1.14   DLCO uncorrected ml/min/mmHg 13.22   DLCO UNC% % 69   DLCO corrected ml/min/mmHg 13.22   DLCO COR %Predicted % 69   DLVA Predicted % 101   TLC L 5.54   TLC % Predicted % 111   RV % Predicted % 183   Personally reviewed and interpreted as air trapping, DLCO moderately reduced  WALK:      No data to display          Imaging: No results found.  Lab Results: Personally reviewed  CBC    Component Value Date/Time   WBC 6.8 11/29/2021 1159   RBC 4.48 11/29/2021 1159   HGB 13.7 11/29/2021 1159   HCT 39.9 11/29/2021 1159   PLT 266.0 11/29/2021 1159   MCV 89.1 11/29/2021 1159   MCH 30.2 02/21/2020 1436   MCHC 34.4 11/29/2021 1159   RDW 13.4 11/29/2021 1159   LYMPHSABS 1.5 11/29/2021 1159   MONOABS 0.8 11/29/2021 1159   EOSABS 0.2 11/29/2021 1159   BASOSABS 0.0  11/29/2021 1159    BMET    Component Value Date/Time   NA 136 02/21/2020 1436   K 4.0 02/21/2020 1436   CL 97 (L) 02/21/2020 1436   CO2 30 02/21/2020 1436   GLUCOSE 87 02/21/2020 1436   BUN 17 02/21/2020 1436   CREATININE 0.69 02/21/2020 1436   CALCIUM 9.9 02/21/2020 1436   GFRNONAA >60 08/19/2017 1148   GFRAA >60 08/19/2017 1148    BNP No results found for: BNP  ProBNP No results found for: PROBNP  Specialty Problems       Pulmonary Problems   DOE (dyspnea on exertion)   Spirometry 11/29/2017  FEV1 1.4 (56%)  Ratio 73 min curvature        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       Bronchiectasis without complication (HCC)   Quit  smoking  2003/MM - CT chest  08/19/17 Consolidative opacities with associated bronchiectasis within the right middle lobe and lingula (representative images 89, series 6). There are additional areas of bronchiectasis within the right upper lobe (representative image 49, series 6) .  -  11/29/2021  quant TB neg  Ig's nl   alpha one   MM level 178 -  HRCT  01/20/22  worse vs priors with bronchiectasis/ MPNs > rec zpak prn flares and consult  Dr Annella - PFTs 03/07/2022  min airflow obstruction  with 1% resp to saba on Timolol eyedrops  - 10/27/2023 cxr has progressed >> added levauin 500 x 7 d and defer all future rx to Dr Annella           Allergies  Allergen Reactions   Penicillins Rash    unknown   Pneumovax 23 [Pneumococcal Vac Polyvalent] Other (See Comments)    Severe localized reaction w/ flu like symptoms   Azithromycin  Rash   Clindamycin/Lincomycin Rash   Nickel Rash    Immunization History  Administered Date(s) Administered   Fluad Quad(high Dose 65+) 11/05/2018, 11/12/2019   Fluad Trivalent(High Dose 65+) 01/25/2023   INFLUENZA, HIGH DOSE SEASONAL PF 10/21/2016, 11/29/2017, 10/27/2023    Influenza Inj Mdck Quad Pf 11/25/2021   Influenza,inj,Quad PF,6+ Mos 11/24/2020   Influenza-Unspecified 11/24/2020   PFIZER Comirnaty(Gray Top)Covid-19 Tri-Sucrose Vaccine 08/25/2020   PFIZER(Purple Top)SARS-COV-2 Vaccination 03/14/2019, 04/09/2019   Pneumococcal Conjugate-13 02/12/2014   Td 06/26/2008   Tdap 03/22/2022    Past Medical History:  Diagnosis Date   Allergy     Anxiety teenager   Cancer (HCC) basal skin cancer   basal skin cancer    Cataract    Depression    DJD (degenerative joint disease) of hip    right - Alusio   Dyslipidemia    GERD (gastroesophageal reflux disease)    Glaucoma (increased eye pressure)    Hyperlipidemia    Hypertension    Hypothyroidism    Osteopenia    Pneumonia    hx of pneumonia - lst time 1998   PONV (postoperative nausea and vomiting)    Vitreous floaters of left eye 04/2013    Tobacco History: Social History   Tobacco Use  Smoking Status Former   Current packs/day: 0.00   Average packs/day: 0.5 packs/day for 20.0 years (10.0 ttl pk-yrs)   Types: Cigarettes   Start date: 01/17/1981   Quit date: 01/17/2001   Years since quitting: 22.9  Smokeless Tobacco Never   Counseling given: Not Answered   Continue to not smoke  Outpatient Encounter Medications as of 12/27/2023  Medication Sig   aspirin  81 MG tablet Take 81 mg by mouth daily.   azelastine  (ASTELIN ) 0.1 % nasal spray Place 2 sprays into both nostrils 2 (two) times daily. Use in each nostril as directed   Calcium Glycerophosphate (PRELIEF PO) Take 1 tablet by mouth daily as needed.   cetirizine (ZYRTEC) 10 MG tablet Take 10 mg by mouth daily as needed for allergies.   cholecalciferol (VITAMIN D3) 25 MCG (1000 UNIT) tablet Take 1,000 Units by mouth daily. 2 x daily   furosemide  (LASIX ) 20 MG tablet TAKE 1 TABLET(20 MG) BY MOUTH DAILY   ipratropium (ATROVENT ) 0.06 % nasal spray Place 2 sprays into both nostrils in the morning and at bedtime.   levothyroxine  (SYNTHROID ) 100  MCG tablet TAKE 1 TABLET BY MOUTH EVERY DAY BEFORE BREAKFAST   Omega-3 Fatty Acids (FISH OIL) 1000 MG CAPS Take by mouth every  other day.   Probiotic CAPS Take 1 capsule by mouth daily.   telmisartan (MICARDIS) 40 MG tablet Take 40 mg by mouth daily.   timolol (TIMOPTIC) 0.5 % ophthalmic solution 1 drop every morning.   Travoprost, BAK Free, (TRAVATAN) 0.004 % SOLN ophthalmic solution Place 1 drop into both eyes at bedtime.   [DISCONTINUED] moxifloxacin  (AVELOX ) 400 MG tablet Take 1 tablet (400 mg total) by mouth daily.   levofloxacin  (LEVAQUIN ) 500 MG tablet Take 1 tablet (500 mg total) by mouth daily. (Patient not taking: Reported on 12/27/2023)   [DISCONTINUED] fluticasone (FLONASE) 50 MCG/ACT nasal spray Place into both nostrils daily. (Patient not taking: Reported on 10/27/2023)   No facility-administered encounter medications on file as of 12/27/2023.     Review of Systems  Review of Systems  N/a Physical Exam  BP (!) 175/93 (BP Location: Left Arm, Patient Position: Sitting, Cuff Size: Normal) Comment: 148/80 recheck  Pulse 93   Temp 97.6 F (36.4 C) (Oral)   Ht 5' 4 (1.626 m)   Wt 118 lb (53.5 kg)   LMP 01/17/2001   SpO2 94%   BMI 20.25 kg/m   Wt Readings from Last 5 Encounters:  12/27/23 118 lb (53.5 kg)  10/27/23 116 lb 6.4 oz (52.8 kg)  09/27/23 116 lb (52.6 kg)  06/07/23 120 lb 12.8 oz (54.8 kg)  03/21/23 121 lb (54.9 kg)    BMI Readings from Last 5 Encounters:  12/27/23 20.25 kg/m  10/27/23 19.98 kg/m  09/27/23 19.91 kg/m  06/07/23 20.74 kg/m  03/21/23 20.77 kg/m     Physical Exam General: Sitting up, not acutely ill-appearing Eyes: EOMI Neck: No JVP Pulmonary: Clear, no work reviewed Cardiovascular: Warm no edema  Assessment & Plan:   Bronchiectasis with frequent exacerbation: Etiology of bronchiectasis not totally clear, high suspicion driven by NTM disease.  Repeat exacerbation 05/2023 with single episode of hemoptysis.  She has had  hemoptysis in the past.  Isolated episodes.  Not chronic or recurrent outside of exacerbations of bronchiectasis.  This is not uncommon with coughing and bronchiectatic changes leading to disruption of capillary membranes.  Ongoing exacerbations intermittently responsive to antibiotics.  Continue airway clearance with flutter valve.  Continue Brinsupri.  If not improving over time we will need to stop this if no benefit.  Encouraged to bring sputum sample.  Chronic cough: Related to bronchiectasis, postnasal drip.  Continue ipratropium, azelastine  nasal sprays.  Return in about 3 months (around 03/26/2024) for f/u Dr. Annella.   Donnice JONELLE Annella, MD 12/29/2023

## 2024-03-26 ENCOUNTER — Ambulatory Visit: Admitting: Pulmonary Disease
